# Patient Record
Sex: Male | Born: 1970 | Race: Black or African American | Hispanic: No | Marital: Married | Smoking: Current some day smoker
Health system: Southern US, Community
[De-identification: ages and names within clinical notes are randomized; demographics above are authoritative.]

## PROBLEM LIST (undated history)

## (undated) DIAGNOSIS — K219 Gastro-esophageal reflux disease without esophagitis: Secondary | ICD-10-CM

## (undated) DIAGNOSIS — Z860100 Personal history of colon polyps, unspecified: Secondary | ICD-10-CM

## (undated) DIAGNOSIS — S3609XA Other injury of spleen, initial encounter: Secondary | ICD-10-CM

## (undated) DIAGNOSIS — N3281 Overactive bladder: Secondary | ICD-10-CM

## (undated) DIAGNOSIS — K5792 Diverticulitis of intestine, part unspecified, without perforation or abscess without bleeding: Secondary | ICD-10-CM

## (undated) DIAGNOSIS — J302 Other seasonal allergic rhinitis: Secondary | ICD-10-CM

## (undated) DIAGNOSIS — I10 Essential (primary) hypertension: Secondary | ICD-10-CM

## (undated) DIAGNOSIS — T7840XA Allergy, unspecified, initial encounter: Secondary | ICD-10-CM

## (undated) HISTORY — PX: SPLENECTOMY: SUR1306

## (undated) HISTORY — DX: Allergy, unspecified, initial encounter: T78.40XA

## (undated) HISTORY — DX: Other injury of spleen, initial encounter: S36.09XA

## (undated) HISTORY — DX: Essential (primary) hypertension: I10

## (undated) HISTORY — DX: Gastro-esophageal reflux disease without esophagitis: K21.9

---

## 1998-03-26 ENCOUNTER — Other Ambulatory Visit: Admission: RE | Admit: 1998-03-26 | Discharge: 1998-03-26 | Payer: Self-pay | Admitting: Family Medicine

## 1999-02-03 ENCOUNTER — Emergency Department (HOSPITAL_COMMUNITY): Admission: EM | Admit: 1999-02-03 | Discharge: 1999-02-03 | Payer: Self-pay | Admitting: Emergency Medicine

## 1999-05-05 ENCOUNTER — Emergency Department (HOSPITAL_COMMUNITY): Admission: EM | Admit: 1999-05-05 | Discharge: 1999-05-05 | Payer: Self-pay | Admitting: Internal Medicine

## 2003-12-06 ENCOUNTER — Emergency Department (HOSPITAL_COMMUNITY): Admission: EM | Admit: 2003-12-06 | Discharge: 2003-12-06 | Payer: Self-pay | Admitting: Emergency Medicine

## 2006-04-05 ENCOUNTER — Ambulatory Visit: Payer: Self-pay | Admitting: Internal Medicine

## 2006-04-28 ENCOUNTER — Encounter: Payer: Self-pay | Admitting: Emergency Medicine

## 2006-04-30 ENCOUNTER — Ambulatory Visit: Payer: Self-pay | Admitting: Internal Medicine

## 2006-11-21 ENCOUNTER — Emergency Department (HOSPITAL_COMMUNITY): Admission: EM | Admit: 2006-11-21 | Discharge: 2006-11-21 | Payer: Self-pay | Admitting: Emergency Medicine

## 2007-01-27 ENCOUNTER — Ambulatory Visit: Payer: Self-pay | Admitting: Internal Medicine

## 2008-02-07 ENCOUNTER — Ambulatory Visit: Payer: Self-pay | Admitting: Internal Medicine

## 2008-02-07 DIAGNOSIS — Z9081 Acquired absence of spleen: Secondary | ICD-10-CM | POA: Insufficient documentation

## 2008-02-07 DIAGNOSIS — J069 Acute upper respiratory infection, unspecified: Secondary | ICD-10-CM | POA: Insufficient documentation

## 2008-08-21 ENCOUNTER — Ambulatory Visit: Payer: Self-pay | Admitting: Internal Medicine

## 2008-08-21 DIAGNOSIS — L909 Atrophic disorder of skin, unspecified: Secondary | ICD-10-CM | POA: Insufficient documentation

## 2008-08-21 DIAGNOSIS — L919 Hypertrophic disorder of the skin, unspecified: Secondary | ICD-10-CM

## 2009-05-13 ENCOUNTER — Encounter: Payer: Self-pay | Admitting: Internal Medicine

## 2009-05-14 ENCOUNTER — Telehealth: Payer: Self-pay | Admitting: Family Medicine

## 2009-05-14 ENCOUNTER — Ambulatory Visit: Payer: Self-pay | Admitting: Family Medicine

## 2009-05-14 DIAGNOSIS — N390 Urinary tract infection, site not specified: Secondary | ICD-10-CM

## 2009-05-14 DIAGNOSIS — R351 Nocturia: Secondary | ICD-10-CM | POA: Insufficient documentation

## 2009-05-14 LAB — CONVERTED CEMR LAB: PSA: 1.78 ng/mL (ref 0.10–4.00)

## 2009-05-20 ENCOUNTER — Ambulatory Visit: Payer: Self-pay | Admitting: Family Medicine

## 2009-05-20 ENCOUNTER — Telehealth (INDEPENDENT_AMBULATORY_CARE_PROVIDER_SITE_OTHER): Payer: Self-pay | Admitting: *Deleted

## 2009-05-20 LAB — CONVERTED CEMR LAB
Bilirubin Urine: NEGATIVE
Hemoglobin, Urine: NEGATIVE
Ketones, ur: NEGATIVE mg/dL
Leukocytes, UA: NEGATIVE
Nitrite: NEGATIVE
Specific Gravity, Urine: 1.025 (ref 1.000–1.030)
Total Protein, Urine: NEGATIVE mg/dL
Urine Glucose: NEGATIVE mg/dL
Urobilinogen, UA: 0.2 (ref 0.0–1.0)
pH: 5 (ref 5.0–8.0)

## 2009-05-21 ENCOUNTER — Encounter: Payer: Self-pay | Admitting: Family Medicine

## 2009-05-21 ENCOUNTER — Telehealth: Payer: Self-pay | Admitting: Internal Medicine

## 2009-08-07 LAB — CONVERTED CEMR LAB

## 2010-07-02 ENCOUNTER — Ambulatory Visit: Payer: Self-pay | Admitting: Family

## 2010-07-02 DIAGNOSIS — K921 Melena: Secondary | ICD-10-CM

## 2010-07-04 ENCOUNTER — Encounter (INDEPENDENT_AMBULATORY_CARE_PROVIDER_SITE_OTHER): Payer: Self-pay | Admitting: *Deleted

## 2010-07-04 LAB — CONVERTED CEMR LAB
ALT: 26 units/L (ref 0–53)
AST: 21 units/L (ref 0–37)
Albumin: 4.7 g/dL (ref 3.5–5.2)
Alkaline Phosphatase: 43 units/L (ref 39–117)
BUN: 11 mg/dL (ref 6–23)
Basophils Absolute: 0 10*3/uL (ref 0.0–0.1)
Basophils Relative: 0 % (ref 0–1)
Bilirubin, Direct: 0.1 mg/dL (ref 0.0–0.3)
CO2: 25 meq/L (ref 19–32)
Calcium: 9.9 mg/dL (ref 8.4–10.5)
Chloride: 103 meq/L (ref 96–112)
Cholesterol: 233 mg/dL — ABNORMAL HIGH (ref 0–200)
Creatinine, Ser: 1.05 mg/dL (ref 0.40–1.50)
Eosinophils Absolute: 0 10*3/uL (ref 0.0–0.7)
Eosinophils Relative: 1 % (ref 0–5)
Glucose, Bld: 97 mg/dL (ref 70–99)
HCT: 46.9 % (ref 39.0–52.0)
HDL: 57 mg/dL (ref >39)
Hemoglobin: 16.2 g/dL (ref 13.0–17.0)
Herpes Simplex Vrs I&II-IgM Ab (EIA): 0.66
Indirect Bilirubin: 0.7 mg/dL (ref 0.0–0.9)
LDL Cholesterol: 159 mg/dL — ABNORMAL HIGH (ref 0–99)
Lymphocytes Relative: 27 % (ref 12–46)
Lymphs Abs: 1.4 10*3/uL (ref 0.7–4.0)
MCHC: 34.5 g/dL (ref 30.0–36.0)
MCV: 88.2 fL (ref 78.0–100.0)
Monocytes Absolute: 0.3 10*3/uL (ref 0.1–1.0)
Monocytes Relative: 6 % (ref 3–12)
Neutro Abs: 3.3 10*3/uL (ref 1.7–7.7)
Neutrophils Relative %: 65 % (ref 43–77)
Platelets: 286 10*3/uL (ref 150–400)
Potassium: 4.5 meq/L (ref 3.5–5.3)
RBC: 5.32 M/uL (ref 4.22–5.81)
RDW: 12 % (ref 11.5–15.5)
Sodium: 139 meq/L (ref 135–145)
TSH: 1.962 microintl units/mL (ref 0.350–4.500)
Total Bilirubin: 0.8 mg/dL (ref 0.3–1.2)
Total CHOL/HDL Ratio: 4.1
Total Protein: 7.5 g/dL (ref 6.0–8.3)
Triglycerides: 84 mg/dL (ref <150)
VLDL: 17 mg/dL (ref 0–40)
WBC: 5 10*3/uL (ref 4.0–10.5)

## 2010-08-08 ENCOUNTER — Encounter (INDEPENDENT_AMBULATORY_CARE_PROVIDER_SITE_OTHER): Payer: Self-pay | Admitting: *Deleted

## 2010-08-08 ENCOUNTER — Ambulatory Visit: Payer: Self-pay | Admitting: Gastroenterology

## 2010-09-12 ENCOUNTER — Ambulatory Visit: Payer: Self-pay | Admitting: Gastroenterology

## 2010-09-12 LAB — HM COLONOSCOPY

## 2010-09-17 ENCOUNTER — Encounter: Payer: Self-pay | Admitting: Gastroenterology

## 2010-12-31 ENCOUNTER — Ambulatory Visit
Admission: RE | Admit: 2010-12-31 | Discharge: 2010-12-31 | Payer: Self-pay | Source: Home / Self Care | Attending: Internal Medicine | Admitting: Internal Medicine

## 2010-12-31 ENCOUNTER — Encounter: Payer: Self-pay | Admitting: Internal Medicine

## 2010-12-31 DIAGNOSIS — K2970 Gastritis, unspecified, without bleeding: Secondary | ICD-10-CM | POA: Insufficient documentation

## 2010-12-31 DIAGNOSIS — K2990 Gastroduodenitis, unspecified, without bleeding: Secondary | ICD-10-CM

## 2010-12-31 DIAGNOSIS — M546 Pain in thoracic spine: Secondary | ICD-10-CM | POA: Insufficient documentation

## 2011-01-15 ENCOUNTER — Ambulatory Visit
Admission: RE | Admit: 2011-01-15 | Discharge: 2011-01-15 | Payer: Self-pay | Source: Home / Self Care | Attending: Internal Medicine | Admitting: Internal Medicine

## 2011-01-15 LAB — CONVERTED CEMR LAB: Influenza B Ag: NEGATIVE

## 2011-01-27 NOTE — Letter (Signed)
Summary: Results Letter  [facility]  [address] [place], [place] [address]   Phone: [phone]  Fax: [phone]        September 17, 2010 MRN: [id number]    BUDDIE MARSTON [address] [place], [place]  [address]    Dear Mr. Lutes,   Good news.  The polyp(s) that were removed during your recent procedure were NOT pre-cancerous.  You should continue to follow current colorectal cancer screening guidelines with a repeat colonoscopy when you turn 50.  We will therefore put your information in our reminder system and will contact you in about 12 years to schedule a repeat procedure.  Please call if you have any questions or concerns.       Sincerely,  Rachael Fee MD  This letter has been electronically signed by your physician.  Appended Document: Results Letter letter mailed

## 2011-01-27 NOTE — Letter (Signed)
Summary: [facility] Instructions  [facility]  [address] [place], [place] [address]   Phone: [phone]  Fax: [phone]       Darin Smith    July 23, 1971    MRN: [id number]        Procedure Day /Date:09/12/10 FRI     Arrival Time:130 pm     Procedure Time:230 pm     Location of Procedure:                    X  [facility] (4th Floor)                        PREPARATION FOR COLONOSCOPY WITH MOVIPREP   Starting 5 days prior to your procedure 09/07/10 do not eat nuts, seeds, popcorn, corn, beans, peas,  salads, or any raw vegetables.  Do not take any fiber supplements (e.g. Metamucil, Citrucel, and Benefiber).  THE DAY BEFORE YOUR PROCEDURE         DATE: 09/11/10  DAY: THURS  1.  Drink clear liquids the entire day-NO SOLID FOOD  2.  Do not drink anything colored red or purple.  Avoid juices with pulp.  No orange juice.  3.  Drink at least 64 oz. (8 glasses) of fluid/clear liquids during the day to prevent dehydration and help the prep work efficiently.  CLEAR LIQUIDS INCLUDE: Water Jello Ice Popsicles Tea (sugar ok, no milk/cream) Powdered fruit flavored drinks Coffee (sugar ok, no milk/cream) Gatorade Juice: apple, white grape, white cranberry  Lemonade Clear bullion, consomm, broth Carbonated beverages (any kind) Strained chicken noodle soup Hard Candy                             4.  In the morning, mix first dose of MoviPrep solution:    Empty 1 Pouch A and 1 Pouch B into the disposable container    Add lukewarm drinking water to the top line of the container. Mix to dissolve    Refrigerate (mixed solution should be used within 24 hrs)  5.  Begin drinking the prep at 5:00 p.m. The MoviPrep container is divided by 4 marks.   Every 15 minutes drink the solution down to the next mark (approximately 8 oz) until the full liter is complete.   6.  Follow completed prep with 16 oz of clear liquid of your choice (Nothing red or purple).   Continue to drink clear liquids until bedtime.  7.  Before going to bed, mix second dose of MoviPrep solution:    Empty 1 Pouch A and 1 Pouch B into the disposable container    Add lukewarm drinking water to the top line of the container. Mix to dissolve    Refrigerate  THE DAY OF YOUR PROCEDURE      DATE: 09/12/10  DAY: Darin Smith  Beginning at 930 a.m. (5 hours before procedure):         1. Every 15 minutes, drink the solution down to the next mark (approx 8 oz) until the full liter is complete.  2. Follow completed prep with 16 oz. of clear liquid of your choice.    3. You may drink clear liquids until 1230 pm (2 HOURS BEFORE PROCEDURE).   MEDICATION INSTRUCTIONS  Unless otherwise instructed, you should take regular prescription medications with a small sip of water   as early as possible the morning of your procedure.  OTHER INSTRUCTIONS  You will need a responsible adult at least 40 years of age to accompany you and drive you home.   This person must remain in the waiting room during your procedure.  Wear loose fitting clothing that is easily removed.  Leave jewelry and other valuables at home.  However, you may wish to bring a book to read or  an iPod/MP3 player to listen to music as you wait for your procedure to start.  Remove all body piercing jewelry and leave at home.  Total time from sign-in until discharge is approximately 2-3 hours.  You should go home directly after your procedure and rest.  You can resume normal activities the  day after your procedure.  The day of your procedure you should not:   Drive   Make legal decisions   Operate machinery   Drink alcohol   Return to work  You will receive specific instructions about eating, activities and medications before you leave.    The above instructions have been reviewed and explained to me by   _______________________    I fully understand and can verbalize these instructions  _____________________________ Date _________

## 2011-01-27 NOTE — Assessment & Plan Note (Signed)
History of Present Illness Visit Type: Initial Consult Primary GI MD: Rob Bunting MD Primary Provider: Dondra Spry DO Requesting Provider: [name], PA Chief Complaint: heme pos stool History of Present Illness:     very pleasant 40 year old man who was recently in for routine physical. He was found to have Hemoccult-positive stool.  who never sees blood in his stool.  No problems with consipation or diarrhea.  his weight has been overall stable.  he had a recent CBC and was completely normal.   GI Review of Systems      Denies belching.         Current Medications (verified): 1)  None  Allergies (verified): No Known Drug Allergies  Past History:  Past Medical History: History of abdominal trauma with splenic rupture - age 57     Past Surgical History: S/P splenectomy     Family History: Mother is 41, is healthy.  Father is age 73, has hypertension.   Patient has 2 sisters, 1 sister is morbidly obese and has thyroid issues, younger sister with MS Mother deceased at age 75- lupus.  Was found dead in her home.  No autopsy was performed.  She went blind in her left eye (unclear etiology). no fam hx of prostate cancer  Social History: Occupation: Works for [employer] Married (2nd marriage) 4 children- currently going through divorce- 3 step children, 24 year old biological son- does not live with patient Never Smoked Alcohol use-few beers a week Denies drug use     Review of Systems       Pertinent positive and negative review of systems were noted in the above HPI and GI specific review of systems.  All other review of systems was otherwise negative.   Vital Signs:  Patient profile:   40 year old male Height:      73 inches Weight:      213.50 pounds BMI:     28.27 Pulse rate:   70 / minute Pulse rhythm:   regular BP sitting:   134 / 78  (right arm) Cuff size:   regular  Vitals Entered By: Christie Nottingham CMA Duncan Dull) (August 08, 2010 3:25  PM)  Physical Exam  Additional Exam:  Constitutional: generally well appearing Psychiatric: alert and oriented times 3 Eyes: extraocular movements intact Mouth: oropharynx moist, no lesions Neck: supple, no lymphadenopathy Cardiovascular: heart regular rate and rythm Lungs: CTA bilaterally Abdomen: soft, non-tender, non-distended, no obvious ascites, no peritoneal signs, normal bowel sounds Extremities: no lower extremity edema bilaterally Skin: no lesions on visible extremities    Impression & Recommendations:  Problem # 1:  Hemoccult positive stool we will schedule him for colonoscopy at his soonest convenience. I see no reason for any further blood tests or imaging studies prior to then.  Patient Instructions: 1)  You will be scheduled to have a colonoscopy. 2)  A copy of this information will be sent to [name], PA. 3)  The medication list was reviewed and reconciled.  All changed / newly prescribed medications were explained.  A complete medication list was provided to the patient / caregiver.  Appended Document: Orders Update/movi    Clinical Lists Changes  Medications: Added new medication of MOVIPREP 100 GM  SOLR (PEG-KCL-NACL-NASULF-NA ASC-C) As per prep instructions. - Signed Rx of MOVIPREP 100 GM  SOLR (PEG-KCL-NACL-NASULF-NA ASC-C) As per prep instructions.;  #1 x 0;  Signed;  Entered by: Chales Abrahams CMA (AAMA);  Authorized by: Rachael Fee MD;  Method used: [facility] [address] [phone]*, [address], [place], [place], [place]  [address], Ph: [phone] or [phone], Fax: [phone] Orders: Added new Test order of Colonoscopy (Colon) - Signed    Prescriptions: MOVIPREP 100 GM  SOLR (PEG-KCL-NACL-NASULF-NA ASC-C) As per prep instructions.  #1 x 0   Entered by:   Chales Abrahams CMA (AAMA)   Authorized by:   Rachael Fee MD   Signed by:   Chales Abrahams CMA (AAMA) on 08/08/2010   Method used:   [facility]         [facility] [address] [phone]* (retail)       [address]       [place], [place]  [address]       Ph: [phone] or [phone]       Fax: [phone]   RxID:   [id number]

## 2011-01-27 NOTE — Letter (Signed)
Summary: New Patient letter  [facility]  [address] [place], [place] [address]   Phone: [phone]  Fax: [phone]       07/04/2010 MRN: [id number]  Darin Smith [address] [place], [place]  [address]  Dear Mr. Closson,  Welcome to the Gastroenterology Division at [facility].    You are scheduled to see Dr.  Christella Hartigan on 08-08-10 at 3:30p.m. on the 3rd floor at [facility], [address]  We ask that you try to arrive at our office 15 minutes prior to your appointment time to allow for check-in.  We would like you to complete the enclosed self-administered evaluation form prior to your visit and bring it with you on the day of your appointment.  We will review it with you.  Also, please bring a complete list of all your medications or, if you prefer, bring the medication bottles and we will list them.  Please bring your insurance card so that we may make a copy of it.  If your insurance requires a referral to see a specialist, please bring your referral form from your primary care physician.  Co-payments are due at the time of your visit and may be paid by cash, check or credit card.     Your office visit will consist of a consult with your physician (includes a physical exam), any laboratory testing he/she may order, scheduling of any necessary diagnostic testing (e.g. x-ray, ultrasound, CT-scan), and scheduling of a procedure (e.g. Endoscopy, Colonoscopy) if required.  Please allow enough time on your schedule to allow for any/all of these possibilities.    If you cannot keep your appointment, please call [phone] to cancel or reschedule prior to your appointment date.  This allows [place] the opportunity to schedule an appointment for another patient in need of care.  If you do not cancel or reschedule by 5 p.m. the business day prior to your appointment date, you will be charged a $50.00 late cancellation/no-show fee.    Thank you for choosing  [facility] for your medical needs.  We appreciate the opportunity to care for you.  Please visit [place] at our website  to learn more about our practice.                     Sincerely,                                                             The Gastroenterology Division

## 2011-01-27 NOTE — Assessment & Plan Note (Signed)
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 Never smoked.  + ETOH few  beers a week.   Preventive Screening-Counseling & Management  Alcohol-Tobacco     Alcohol drinks/day: <1     Smoking Status: never  Caffeine-Diet-Exercise     Caffeine use/day: 2-3 beverages daily     Does Patient Exercise: yes  Allergies (verified): No Known Drug Allergies  Past History:  Past Medical History: Last updated: 08/21/2008 History of abdominal trauma with splenic rupture - age 89   Past Surgical History: Last updated: 08/21/2008 S/P splenectomy   Family History: Last updated: 07/02/2010 Mother is 89, is healthy.  Father is age 74, has hypertension.   Patient has 2 sisters, 1 sister is morbidly obese and has thyroid issues, younger sister with MS Mother deceased at age 63- lupus.  Was found dead in her home.  No autopsy was performed.  She went blind in her left eye (unclear etiology).  no fam hx of prostate cancer  Social History: Last updated: 07/02/2010 Occupation: Works for [employer] Married (2nd marriage) 4 children- currently going through divorce- 3 step children, 103 year old biological son- does not live with patient Never Smoked Alcohol use-few beers a week Denies  drug use  Risk Factors: Alcohol Use: <1 (07/02/2010) Caffeine Use: 2-3 beverages daily (07/02/2010) Exercise: yes (07/02/2010)  Risk Factors: Smoking Status: never (07/02/2010)  Family History: Mother is 74, is healthy.  Father is age 15, has hypertension.   Patient has 2 sisters, 1 sister is morbidly obese and has thyroid issues, younger sister with MS Mother deceased at age 94- lupus.  Was found dead in her home.  No autopsy was performed.  She went blind in her left eye (unclear etiology).  no fam hx of prostate cancer  Social History: Occupation: Works for [employer] Married (2nd marriage) 4 children- currently going through divorce- 3 step children, 48 year old biological son- does not live with patient Never Smoked Alcohol use-few beers a week Denies drug use Caffeine use/day:  2-3 beverages daily Does Patient Exercise:  yes  Review of Systems       Constitutional: Denies Fever ENT:  Denies nasal congestion or sore throat. Resp: Denies cough CV:  Denies Chest Pain GI:  Denies nausea or vomitting GU: Denies dysuria Lymphatic: Denies current  lymphadenopathy- does not that he had episode of tender LAD in groin about 1 year ago- no penile rashes- 1 sexual partner in last 6 months Musculoskeletal:  Denies muscle/joint pain-  notes + fascitis in his left foot- had injections, intermittently bothers him. Skin:  Denies Rashes Psychiatric: Denies depression or anxiety Neuro: Denies numbness or weakness     Physical Exam  General:  Well-developed,well-nourished,in no acute distress; alert,appropriate and cooperative throughout examination Head:  Normocephalic and atraumatic without obvious abnormalities. No apparent alopecia or balding. Eyes:  PERRLA Ears:  External ear exam shows no significant lesions or deformities.  Otoscopic examination reveals clear canals, tympanic membranes are intact bilaterally without bulging, retraction, inflammation or discharge. Hearing is grossly  normal bilaterally. Mouth:  Oral mucosa and oropharynx without lesions or exudates.  Teeth in good repair. Neck:  No deformities, masses, or tenderness noted. Lungs:  Normal respiratory effort, chest expands symmetrically. Lungs are clear to auscultation, no crackles or wheezes. Heart:  Normal rate and regular rhythm. S1 and S2 normal without gallop, murmur, click, rub or other extra sounds. Abdomen:  Bowel sounds positive,abdomen soft and non-tender without masses, organomegaly or hernias noted. Rectal:  no external abnormalities, no hemorrhoids, and normal sphincter tone.  heme + stool Genitalia:  Testes bilaterally descended without nodularity, tenderness or masses. No scrotal masses or lesions. No penis lesions or urethral discharge. Prostate:  Prostate gland firm and smooth, no enlargement, nodularity, tenderness, mass, asymmetry or induration. Msk:  No deformity or scoliosis noted of thoracic or lumbar spine.   Extremities:  No clubbing, cyanosis, edema, or deformity noted with normal full range of motion of all joints.   Neurologic:  No cranial nerve deficits noted. Station and gait are normal. Plantar reflexes are down-going bilaterally. DTRs are symmetrical throughout. Sensory, motor and coordinative functions appear intact. Skin:  Intact without suspicious lesions or rashes Cervical Nodes:  No lymphadenopathy noted Psych:  Cognition and judgment appear intact. Alert and cooperative with normal attention span and concentration. No apparent delusions, illusions, hallucinations   Impression & Recommendations:  Problem # 1:  Preventive Health Care (ICD-V70.0) Assessment Comment Only Patient was counseled on diet, exercise, and weight loss.  He was also counseled on TSE.  Immunizations reviewed and up to date. Orders: TLB-BMP (Basic Metabolic Panel-BMET) (80048-METABOL) TLB-CBC Platelet - w/Differential (85025-CBCD) TLB-Hepatic/Liver Function Pnl (80076-HEPATIC) TLB-TSH (Thyroid  Stimulating Hormone) (84443-TSH) TLB-Lipid Panel (80061-LIPID) T-HIV-1 (Screen) ([address]) T-Herpes I and II, IgM, Reflex (09811-91478)  Problem # 2:  HEMOCCULT POSITIVE STOOL (ICD-578.1) Assessment: New  Incidental finding today on exam.  No visible or palpable hemorrhoids.  H/H normal.  Will refer to GI for further evaluation.    Orders: Gastroenterology Referral (GI)  Complete Medication List: 1)  Ciprofloxacin Hcl 500 Mg Tabs (Ciprofloxacin hcl) .... Take 1 two times a day for 7 days  Patient Instructions: 1)  It is important that you exercise regularly at least 20 minutes 5 times a week. If you develop chest pain, have severe difficulty breathing, or feel very tired , stop exercising immediately and seek medical attention. 2)  Work hard on a healthy diet. 3)  Complete your blood work downstairs today on the first floor. 4)  Follow up in 1 year, sooner if problems or concerns. Prescriptions: CIPROFLOXACIN HCL 500 MG TABS (CIPROFLOXACIN HCL) take 1 two times a day for 7 days  #14 x 0   Entered and Authorized by:   Darin Smith   Signed by:   Darin Smith on 07/02/2010   Method used:   [facility]        [facility]  [address] [phone]* (retail)  [address]       [place], [place]  [address]       Ph: [phone]       Fax: [phone]   RxID:   [id number]   Current Allergies (reviewed today): No known allergies    Preventive Care Screening  Last Tetanus Booster:    Date:  06/16/2005    Results:  Historical     Preventive Care Screening  Last Tetanus Booster:    Date:  06/16/2005    Results:  Historical

## 2011-01-27 NOTE — Procedures (Signed)
Summary: Colonoscopy  Patient: Darin Smith Note: All result statuses are Final unless otherwise noted.  Tests: (1) Colonoscopy (COL)   COL Colonoscopy           DONE     [facility]     [address]     [place], [place]  [address]           COLONOSCOPY PROCEDURE REPORT           PATIENT:  [name], [name]  MR#:  [id number]     BIRTHDATE:  [birth year], 38 yrs. old  GENDER:  male     ENDOSCOPIST:  Rachael Fee, MD     REF. BY:  Alma DownsLendell Caprice, N.P.     PROCEDURE DATE:  09/12/2010     PROCEDURE:  Colonoscopy with snare polypectomy     ASA CLASS:  Class II     INDICATIONS:  FOBT positive stool     MEDICATIONS:   Fentanyl 50 mcg IV, Versed 5 mg IV           DESCRIPTION OF PROCEDURE:   After the risks benefits and     alternatives of the procedure were thoroughly explained, informed     consent was obtained.  Digital rectal exam was performed and     revealed no rectal masses.   The LB [id number] endoscope     was introduced through the anus and advanced to the cecum, which     was identified by both the appendix and ileocecal valve, without     limitations.  The quality of the prep was excellent, using     MoviPrep.  The instrument was then slowly withdrawn as the colon     was fully examined.     <<PROCEDUREIMAGES>>     FINDINGS:  There was a single small sessile polyp in rectum.  This     was 3mm across, removed with cold snare and sent to pathology (jar     1) (see image3).  This was otherwise a normal examination of the     colon (see image4, image2, and image1).   Retroflexed views in the     rectum revealed no abnormalities.    The scope was then withdrawn     from the patient and the procedure completed.     COMPLICATIONS:  None           ENDOSCOPIC IMPRESSION:     1) Small polyp, removed and sent to pathology     2) Otherwise normal examination           RECOMMENDATIONS:     1) If the polyp(s) removed today are proven to be adenomatous  (pre-cancerous) polyps, you will need a repeat colonoscopy in 5     years. Otherwise you should continue to follow colorectal cancer     screening guidelines for "routine risk" patients with colonoscopy     at age 46.     2) You will receive a letter within 1-2 weeks with the results     of your biopsy as well as final recommendations. Please call my     office if you have not received a letter after 3 weeks.           ______________________________     Rachael Fee, MD           n.     eSIGNED:   Rachael Fee at 09/12/2010 02:40 PM  Lorn, Butcher, [id number]  Note: An exclamation mark (!) indicates a result that was not dispersed into the flowsheet. Document Creation Date: 09/12/2010 2:41 PM _______________________________________________________________________  (1) Order result status: Final Collection or observation date-time: 09/12/2010 14:35 Requested date-time:  Receipt date-time:  Reported date-time:  Referring Physician:   Ordering Physician: Rob Bunting [phone]) Specimen Source:  Source: Launa Grill Order Number: [id number] Lab site:   Appended Document: Colonoscopy     Procedures Next Due Date:    Colonoscopy: 08/2022

## 2011-01-29 NOTE — Assessment & Plan Note (Signed)
Summary: Abd Pain  acute/hea   Vital Signs:  Patient profile:   40 year old male Height:      73 inches Weight:      209.25 pounds BMI:     27.71 O2 Sat:      100 % on Room air Temp:     98.2 degrees F oral Pulse rate:   89 / minute Resp:     18 per minute BP sitting:   124 / 80  (right arm) Cuff size:   large  Vitals Entered By: Glendell Docker CMA (December 31, 2010 11:20 AM)  O2 Flow:  Room air CC: abdominal pain Is Patient Diabetic? No Pain Assessment Patient in pain? 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  Abdominal pain for the past 3 weeks. She states pepo bismo x 3 with some relief   Primary Care Provider:  Dondra Spry DO  CC:  abdominal pain.  History of Present Illness: 3 months ago - seen at [facility] for thoracic back pain prescribed mobic and flexeril mobic causing abd pain no melena no change in bowel habits  Preventive Screening-Counseling & Management  Alcohol-Tobacco     Smoking Status: never  Allergies (verified): No Known Drug Allergies  Past History:  Past Medical History: History of abdominal trauma with splenic rupture - age 68       Past Surgical History: S/P splenectomy      Family History: Mother is 36, is healthy.  Father is age 20, has hypertension.   Patient has 2 sisters, 1 sister is morbidly obese and has thyroid issues, younger sister with MS Mother deceased at age 74- lupus.  Was found dead in her home.  No autopsy was performed.  She went blind in her left eye (unclear etiology). no fam hx of prostate cancer    Social History: Occupation: Works for [employer] Married (2nd marriage) 4 children- currently going through divorce- 3 step children, 78 year old biological son- does not live with patient Never Smoked Alcohol use-few beers a week  Denies drug use     Review of Systems  The  patient denies hemoptysis, melena, and hematochezia.    Physical Exam  General:  alert, well-developed, and well-nourished.   Lungs:  Normal respiratory effort, chest expands symmetrically. Lungs are clear to auscultation, no crackles or wheezes. Heart:  Normal rate and regular rhythm. S1 and S2 normal without gallop, murmur, click, rub or other extra sounds. Msk:  T7-9 rotated left   Impression & Recommendations:  Problem # 1:  BACK PAIN, THORACIC REGION (ICD-724.1)  intermittent thoracic back pain secondary to somatic dysfunction persistent symptoms despite NSAIDs and trigger point injectin utilized myofacial release and HVLA to LS and thoracic spine pt tolerated well  Orders: OMT 1-2 Body [place] (09811)  Problem # 2:  GASTRITIS (ICD-535.50) probable NSAID indused gastritis PPI x 4-6 weeks stop NSAIDs His updated medication list for this problem includes:    Omeprazole-sodium Bicarbonate 40-1100 Mg Caps (Omeprazole-sodium bicarbonate) ..... One by mouth once daily  Complete Medication List: 1)  Omeprazole-sodium Bicarbonate 40-1100 Mg Caps (Omeprazole-sodium bicarbonate) .... One by mouth once daily  Patient Instructions: 1)  Please schedule a follow-up appointment in 1 month. Prescriptions: OMEPRAZOLE-SODIUM BICARBONATE 40-1100 MG CAPS (OMEPRAZOLE-SODIUM BICARBONATE) one by mouth once daily  #30 x 2   Entered and Authorized by:   D. Molly Maduro  Artist Pais DO   Signed by:   D. Thomos Lemons DO on 12/31/2010   Method used:   Electronically to        [facility] [address] [phone]* (retail)       [address]       [place], [place]  [address]       Ph: [phone] or [phone]       Fax: [phone]   RxID:   [id number]    Orders Added: 1)  Est. Patient Level III [33295] 2)  OMT 1-2 Body [place]ions [18841]    Current Allergies (reviewed today): No known allergies

## 2011-01-29 NOTE — Letter (Signed)
Summary: [facility]  [facility]   Imported By: Lanelle Bal 01/21/2011 09:08:53  _____________________________________________________________________  External Attachment:    Type:   Image     Comment:   External Document

## 2011-02-04 NOTE — Assessment & Plan Note (Signed)
Summary: 11:15 SINUS INFECTION/MHF   Vital Signs:  Patient profile:   40 year old male Height:      73 inches Weight:      207 pounds BMI:     27.41 O2 Sat:      99 % on Room air Temp:     97.9 degrees F oral Pulse rate:   71 / minute Resp:     18 per minute BP sitting:   120 / 80  (left arm) Cuff size:   large  Vitals Entered By: Glendell Docker CMA (January 15, 2011 11:18 AM)  O2 Flow:  Room air CC: Sinus discomfort Is Patient Diabetic? No Pain Assessment Patient in pain? no        Primary Care Provider:  Dondra Spry DO  CC:  Sinus discomfort.  History of Present Illness:  40 y/o male co facial pain, no fever, non-productive cough , nasal drainage yellow in color otc robitussin with some some relief, this am sinex with no relief body aches  Preventive Screening-Counseling & Management  Alcohol-Tobacco     Smoking Status: never  Allergies: No Known Drug Allergies SH/Risk Factors reviewed for relevance  Family History: Mother is 36, is healthy.  Father is age 23, has hypertension.   Patient has 2 sisters, 1 sister is morbidly obese and has thyroid issues, younger sister with MS Mother deceased at age 68- lupus.  Was found dead in her home.  No autopsy was performed.  She went blind in her left eye (unclear etiology). no fam hx of prostate cancer     Social History: Occupation: Works for [employer] Married (2nd marriage) 4 children- currently going through divorce- 3 step children, 9 year old biological son- does not live with patient Never Smoked Alcohol use-few beers a week   Denies drug use     Physical Exam  General:  alert, well-developed, and well-nourished.   Ears:  right ear retracted,  left Mouth:  pharyngeal erythema.   Neck:  mild right neck tenderness Lungs:  normal respiratory effort and normal breath sounds.   Heart:  normal rate, regular rhythm, and no gallop.     Impression & Recommendations:  Problem # 1:  URI  (ICD-465.9) Assessment Unchanged  Instructed on symptomatic treatment. Call if symptoms persist or worsen.   Problem # 2:  GASTRITIS (ICD-535.50) Assessment: Improved  His updated medication list for this problem includes:    Omeprazole-sodium Bicarbonate 40-1100 Mg Caps (Omeprazole-sodium bicarbonate) ..... One by mouth once daily  Complete Medication List: 1)  Omeprazole-sodium Bicarbonate 40-1100 Mg Caps (Omeprazole-sodium bicarbonate) .... One by mouth once daily 2)  Cefuroxime Axetil 500 Mg Tabs (Cefuroxime axetil) .... One by mouth two times a day  Other Orders: Flu A+B (16109)  Patient Instructions: 1)  Drink plenty of fluids 2)  Take OTC analgesics as directed 3)  Call our office if your symptoms do not  improve or gets worse. Prescriptions: CEFUROXIME AXETIL 500 MG TABS (CEFUROXIME AXETIL) one by mouth two times a day  #14 x 0   Entered and Authorized by:   D. Thomos Lemons DO   Signed by:   D. Thomos Lemons DO on 01/15/2011   Method used:   Print then Give to Patient   RxID:   [id number]    Orders Added: 1)  Flu A+B [87400] 2)  New Patient Level III [99203]     Laboratory Results    Other Tests  Influenza A: negative Influenza B: negative

## 2011-02-06 ENCOUNTER — Ambulatory Visit: Payer: Self-pay | Admitting: Internal Medicine

## 2011-07-24 ENCOUNTER — Encounter: Payer: Self-pay | Admitting: Internal Medicine

## 2011-08-04 ENCOUNTER — Ambulatory Visit (INDEPENDENT_AMBULATORY_CARE_PROVIDER_SITE_OTHER): Payer: BC Managed Care – PPO | Admitting: Family

## 2011-08-04 ENCOUNTER — Encounter: Payer: Self-pay | Admitting: Family

## 2011-08-04 ENCOUNTER — Encounter: Payer: Self-pay | Admitting: Internal Medicine

## 2011-08-04 DIAGNOSIS — Z0000 Encounter for general adult medical examination without abnormal findings: Secondary | ICD-10-CM

## 2011-08-04 DIAGNOSIS — Z23 Encounter for immunization: Secondary | ICD-10-CM

## 2011-08-04 DIAGNOSIS — R591 Generalized enlarged lymph nodes: Secondary | ICD-10-CM

## 2011-08-04 DIAGNOSIS — R599 Enlarged lymph nodes, unspecified: Secondary | ICD-10-CM

## 2011-08-04 NOTE — Progress Notes (Signed)
Subjective:    Patient ID: Darin Smith, male    DOB: 08/02/1971, 40 y.o.   MRN: [id number]  HPI  Preventative-  Works as [employer]-  Not formaly exercising.  Diet is "up and down." eating more fruits and veggies. Still eating fast food for lunch.    Review of Systems  Constitutional: Negative for unexpected weight change.  HENT: Negative for hearing loss.   Eyes: Negative for visual disturbance.  Respiratory: Negative for shortness of breath.   Cardiovascular: Negative for chest pain and leg swelling.  Gastrointestinal: Negative for diarrhea, constipation and blood in stool.  Genitourinary: Negative for dysuria and frequency.  Musculoskeletal: Positive for back pain.       Pt to see orthopedic spine specialist- awaiting apt.  Neurological: Negative for headaches.  Hematological: Positive for adenopathy.       Reports + swollen lymph nodes in the groin area.   Non tender. First noticed this 4-5 yrs ago- reports that he discussed this with Dr. Artist Pais  Psychiatric/Behavioral:       Denies depression/anxiety.   Past Medical History  Diagnosis Date  . Rupture spleen age 8    hx of abdominal trauma    History   Social History  . Marital Status: Legally Separated    Spouse Name: N/A    Number of Children: N/A  . Years of Education: N/A   Occupational History  . Not on file.   Social History Main Topics  . Smoking status: Never Smoker   . Smokeless tobacco: Not on file  . Alcohol Use: Yes     few beers a week  . Drug Use: No  . Sexually Active:    Other Topics Concern  . Not on file   Social History Narrative   Regular exercise:  [employer] driverCaffeine Use: 2-3 soda/teaDivorced, son shares time between pt and his ex wife.     Past Surgical History  Procedure Date  . Splenectomy     Family History  Problem Relation Age of Onset  . Lupus Mother   . Vision loss Mother     in left eye  . Hypertension Father   . Obesity Sister   . Thyroid disease Sister   . Cancer Neg  Hx     prostate  . Multiple sclerosis Sister     No Known Allergies  No current outpatient prescriptions on file prior to visit.    BP 124/80  Pulse 78  Temp(Src) 98.1 F (36.7 C) (Oral)  Resp 16  Ht 6' 0.99" (1.854 m)  Wt 212 lb 1.3 oz (96.199 kg)  BMI 27.99 kg/m2        Objective:   Physical Exam  Constitutional: He is oriented to person, place, and time. He appears well-developed and well-nourished.  HENT:  Head: Normocephalic and atraumatic.  Eyes: Conjunctivae are normal. Pupils are equal, round, and reactive to light.  Neck: Normal range of motion. Neck supple. No thyromegaly present.  Cardiovascular: Normal rate and regular rhythm.   Pulmonary/Chest: Effort normal and breath sounds normal.  Abdominal: Soft. Bowel sounds are normal. He exhibits no distension and no mass.       Midline ventral incision- intact without any bulging or visible hernia.    Musculoskeletal: Normal range of motion. He exhibits no edema.  Neurological: He is alert and oriented to person, place, and time. He displays normal reflexes. No cranial nerve deficit. He exhibits normal muscle tone. Coordination normal.  Skin: Skin is warm and dry.  No erythema.  Psychiatric: He has a normal mood and affect. His behavior is normal. Judgment and thought content normal.  Lymph- no significant cervical, axillary or inguinal LAD noted.          Assessment & Plan:

## 2011-08-04 NOTE — Patient Instructions (Signed)
Please complete your lab work on the first floor.  Follow up in 1 year, sooner if problems or concerns.

## 2011-08-04 NOTE — Assessment & Plan Note (Signed)
Pt counseled on health diet and exercise.   Will give pneumovax today as pt has hx of spelectomy and we have no record of pneumovax.  Will send HSV titers due to patient's report of inguina LAD, however none is appreciated today.  Will also send HIV screen.  Intermittent LAD likely related to pt's hx of splenectomy.

## 2011-08-05 ENCOUNTER — Telehealth: Payer: Self-pay | Admitting: Family

## 2011-08-05 DIAGNOSIS — N419 Inflammatory disease of prostate, unspecified: Secondary | ICD-10-CM

## 2011-08-05 LAB — CBC WITH DIFFERENTIAL/PLATELET
Basophils Absolute: 0 10*3/uL (ref 0.0–0.1)
Eosinophils Relative: 1 % (ref 0–5)
HCT: 46.6 % (ref 39.0–52.0)
Lymphocytes Relative: 17 % (ref 12–46)
Lymphs Abs: 0.9 10*3/uL (ref 0.7–4.0)
MCV: 92.1 fL (ref 78.0–100.0)
Neutro Abs: 3.7 10*3/uL (ref 1.7–7.7)
Platelets: 314 10*3/uL (ref 150–400)
RBC: 5.06 MIL/uL (ref 4.22–5.81)
RDW: 13.6 % (ref 11.5–15.5)
WBC: 5.1 10*3/uL (ref 4.0–10.5)

## 2011-08-05 LAB — BASIC METABOLIC PANEL
CO2: 26 mEq/L (ref 19–32)
Calcium: 9.2 mg/dL (ref 8.4–10.5)
Creat: 1.03 mg/dL (ref 0.50–1.35)
Glucose, Bld: 90 mg/dL (ref 70–99)
Sodium: 139 mEq/L (ref 135–145)

## 2011-08-05 LAB — HEPATIC FUNCTION PANEL
Albumin: 4.1 g/dL (ref 3.5–5.2)
Alkaline Phosphatase: 53 U/L (ref 39–117)
Total Protein: 6.9 g/dL (ref 6.0–8.3)

## 2011-08-05 LAB — HSV 1 ANTIBODY, IGG: HSV 1 Glycoprotein G Ab, IgG: 0.14 IV

## 2011-08-05 LAB — LIPID PANEL
HDL: 58 mg/dL (ref >39)
Total CHOL/HDL Ratio: 3.1 Ratio
Triglycerides: 77 mg/dL (ref <150)

## 2011-08-05 LAB — TSH: TSH: 2.005 u[IU]/mL (ref 0.350–4.500)

## 2011-08-05 MED ORDER — VALACYCLOVIR HCL 1 G PO TABS: 1000.0000 mg | ORAL_TABLET | Freq: Two times a day (BID) | ORAL | Status: AC

## 2011-08-05 MED ORDER — CIPROFLOXACIN HCL 500 MG PO TABS: 500.0000 mg | ORAL_TABLET | Freq: Two times a day (BID) | ORAL | Status: AC

## 2011-08-05 NOTE — Telephone Encounter (Signed)
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  Upon further questioning he notes that he has seen ulcerated penile lesions about every 2 months for the last 4 years.  We discussed risk of transmission to others and acute versus suppressive therapy.  He would like to start a suppressive therapy.  Also reviewed borderline elevated PSA.  Will plan to treat empirically for prostatitis with cipro.  Repeat PSA in 1 month- pt verbalizes understanding.

## 2011-09-10 ENCOUNTER — Telehealth: Payer: Self-pay | Admitting: Family

## 2011-09-10 DIAGNOSIS — R972 Elevated prostate specific antigen [PSA]: Secondary | ICD-10-CM

## 2011-09-10 NOTE — Telephone Encounter (Signed)
Please call pt and ask him to return to the lab next week to complete a follow up PSA.

## 2011-09-11 NOTE — Telephone Encounter (Signed)
Lab order created today has been cancelled as a future order was placed for this test in August. Confirmed with Katrina that order is already in the lab.

## 2011-09-11 NOTE — Telephone Encounter (Signed)
Pt called back asking if he could go to [facility] at [address] for PSA level. Spoke to [place] and she will fax order to that facility. Notified pt he may have lab checked there.

## 2011-09-11 NOTE — Telephone Encounter (Signed)
Notified pt. Lab order entered and forwarded to the lab for next week.

## 2011-09-13 ENCOUNTER — Telehealth: Payer: Self-pay | Admitting: Family

## 2011-09-13 DIAGNOSIS — R972 Elevated prostate specific antigen [PSA]: Secondary | ICD-10-CM

## 2011-09-14 ENCOUNTER — Telehealth: Payer: Self-pay | Admitting: Family

## 2011-09-14 NOTE — Telephone Encounter (Signed)
See phone note dated 9/17.

## 2011-09-14 NOTE — Telephone Encounter (Signed)
Called pt, reviewed PSA results and plans to refer to urology. Pt verbalizes understanding.

## 2011-09-28 ENCOUNTER — Encounter: Payer: Self-pay | Admitting: *Deleted

## 2011-09-28 NOTE — Telephone Encounter (Signed)
Encounter opened in error

## 2011-12-02 ENCOUNTER — Encounter: Payer: Self-pay | Admitting: Family

## 2011-12-02 ENCOUNTER — Ambulatory Visit (INDEPENDENT_AMBULATORY_CARE_PROVIDER_SITE_OTHER): Payer: BC Managed Care – PPO | Admitting: Family

## 2011-12-02 VITALS — BP 132/86 | HR 84 | Temp 98.0°F | Resp 16 | Wt 216.0 lb

## 2011-12-02 DIAGNOSIS — J029 Acute pharyngitis, unspecified: Secondary | ICD-10-CM

## 2011-12-02 DIAGNOSIS — J069 Acute upper respiratory infection, unspecified: Secondary | ICD-10-CM

## 2011-12-02 LAB — POCT INFLUENZA A/B: Influenza B, POC: NEGATIVE

## 2011-12-02 LAB — POCT RAPID STREP A (OFFICE): Rapid Strep A Screen: NEGATIVE

## 2011-12-02 MED ORDER — CEFDINIR 300 MG PO CAPS: 300.0000 mg | ORAL_CAPSULE | Freq: Two times a day (BID) | ORAL | Status: AC

## 2011-12-02 NOTE — Progress Notes (Signed)
  Subjective:    Patient ID: Darin Smith, male    DOB: October 14, 1971, 40 y.o.   MRN: [id number]  HPI  Mr.  Smith is a 40 yr old male who presents today with chief complaint of sore throat since Monday.  He tried robitussin DM for a cough, then he took the multisymptom. No significant improvement.  Denies fever.   Reports myalgias and fatigue.  Review of Systems    see hpi Objective:   Physical Exam  Constitutional: He appears well-developed and well-nourished.       Tired appearing  HENT:  Right Ear: Tympanic membrane and ear canal normal.  Left Ear: Tympanic membrane and ear canal normal.  Mouth/Throat: Posterior oropharyngeal erythema present. No oropharyngeal exudate or posterior oropharyngeal edema.  Cardiovascular: Normal rate and regular rhythm.   No murmur heard. Pulmonary/Chest: Effort normal and breath sounds normal. No respiratory distress. He has no wheezes. He has no rales. He exhibits no tenderness.  Skin: Skin is warm and dry.  Psychiatric: He has a normal mood and affect. His behavior is normal. Judgment and thought content normal.          Assessment & Plan:

## 2011-12-02 NOTE — Assessment & Plan Note (Signed)
Rapid strep is negative. Flu swab also negative. 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 Call if symptoms worsen or do not improve.

## 2011-12-02 NOTE — Patient Instructions (Signed)
Please call if your symptoms worsen, if you develop fever >100,  or if your are not feeling better in 2-3 days.

## 2011-12-04 ENCOUNTER — Telehealth: Payer: Self-pay | Admitting: Family

## 2011-12-04 NOTE — Telephone Encounter (Signed)
Called patient to follow up.  He reports feeling improved today.

## 2012-01-12 ENCOUNTER — Telehealth: Payer: Self-pay | Admitting: *Deleted

## 2012-01-12 MED ORDER — CEFUROXIME AXETIL 500 MG PO TABS: 500.0000 mg | ORAL_TABLET | Freq: Two times a day (BID) | ORAL | Status: AC

## 2012-01-12 NOTE — Telephone Encounter (Signed)
Pt notified and voices understanding. 

## 2012-01-12 NOTE — Telephone Encounter (Signed)
Pt called requesting refill of antibiotic. Advised pt that he will need to be seen before an antibiotic can be prescribed. 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  Currently has cough with yellow mucus, no fever or other symptoms at present. Please advise.

## 2012-01-12 NOTE — Telephone Encounter (Signed)
Rx has been sent to his pharmacy.  He will need to be seen in the office if fever >101, if symptoms worsen,  or if no improvement in 2-3 days.

## 2012-04-27 HISTORY — PX: BIOPSY PROSTATE: PRO28

## 2012-06-01 ENCOUNTER — Encounter: Payer: Self-pay | Admitting: Family

## 2012-08-12 ENCOUNTER — Encounter: Payer: Self-pay | Admitting: Family

## 2012-08-12 ENCOUNTER — Ambulatory Visit (INDEPENDENT_AMBULATORY_CARE_PROVIDER_SITE_OTHER): Payer: BC Managed Care – PPO | Admitting: Family

## 2012-08-12 VITALS — BP 110/78 | HR 73 | Temp 98.1°F | Resp 16 | Ht 73.0 in | Wt 210.0 lb

## 2012-08-12 DIAGNOSIS — Z9081 Acquired absence of spleen: Secondary | ICD-10-CM

## 2012-08-12 DIAGNOSIS — K219 Gastro-esophageal reflux disease without esophagitis: Secondary | ICD-10-CM

## 2012-08-12 DIAGNOSIS — Z9089 Acquired absence of other organs: Secondary | ICD-10-CM

## 2012-08-12 DIAGNOSIS — R972 Elevated prostate specific antigen [PSA]: Secondary | ICD-10-CM

## 2012-08-12 DIAGNOSIS — Z0000 Encounter for general adult medical examination without abnormal findings: Secondary | ICD-10-CM

## 2012-08-12 LAB — LIPID PANEL
Cholesterol: 172 mg/dL (ref 0–200)
HDL: 53 mg/dL (ref >39)
Total CHOL/HDL Ratio: 3.2 Ratio
Triglycerides: 71 mg/dL (ref <150)
VLDL: 14 mg/dL (ref 0–40)

## 2012-08-12 LAB — BASIC METABOLIC PANEL WITH GFR
GFR, Est African American: >89 mL/min
Glucose, Bld: 91 mg/dL (ref 70–99)
Potassium: 4.6 mEq/L (ref 3.5–5.3)
Sodium: 142 mEq/L (ref 135–145)

## 2012-08-12 LAB — CBC WITH DIFFERENTIAL/PLATELET
Eosinophils Absolute: 0.1 10*3/uL (ref 0.0–0.7)
Hemoglobin: 15.5 g/dL (ref 13.0–17.0)
Lymphocytes Relative: 26 % (ref 12–46)
Lymphs Abs: 1.3 10*3/uL (ref 0.7–4.0)
MCH: 30.3 pg (ref 26.0–34.0)
Monocytes Relative: 9 % (ref 3–12)
Neutro Abs: 3.1 10*3/uL (ref 1.7–7.7)
Neutrophils Relative %: 64 % (ref 43–77)
Platelets: 310 10*3/uL (ref 150–400)
RBC: 5.12 MIL/uL (ref 4.22–5.81)
WBC: 4.9 10*3/uL (ref 4.0–10.5)

## 2012-08-12 LAB — HEPATIC FUNCTION PANEL
ALT: 22 U/L (ref 0–53)
Albumin: 4.3 g/dL (ref 3.5–5.2)
Total Protein: 7.3 g/dL (ref 6.0–8.3)

## 2012-08-12 MED ORDER — CEFUROXIME AXETIL 500 MG PO TABS: ORAL_TABLET | ORAL | Status: DC

## 2012-08-12 NOTE — Assessment & Plan Note (Signed)
He has had 2 negative prostate biopsies.  He is monitored by Dr. Brunilda Payor and has follow up in December.  I instructed pt to keep this apt.

## 2012-08-12 NOTE — Assessment & Plan Note (Signed)
Recommended prilosec otc prn.  GERD diet hand out provided.

## 2012-08-12 NOTE — Assessment & Plan Note (Addendum)
Immunizations reviewed and up to date.  Obtain fasting lab work.  Pt counseled on healthy eating.  His BMI is >27, but he has a lean muscle mass which I think accounts for much of his weight. EKG performed today and personally reviewed- NSR, no ischemic changes.

## 2012-08-12 NOTE — Patient Instructions (Addendum)
Please call to schedule a flu shot this fall.   Complete lab work prior to leaving.     Preventive Care for Adults, Male A healthy lifestyle and preventative care can promote health and wellness. Preventative health guidelines for men include the following key practices:  A routine yearly physical is a good way to check with your caregiver about your health and preventative screening. It is a chance to share any concerns and updates on your health, and to receive a thorough exam.   Visit your dentist for a routine exam and preventative care every 6 months. Brush your teeth twice a day and floss once a day. Good oral hygiene prevents tooth decay and gum disease.   The frequency of eye exams is based on your age, health, family medical history, use of contact lenses, and other factors. Follow your caregiver's recommendations for frequency of eye exams.   Eat a healthy diet. Foods like vegetables, fruits, whole grains, low-fat dairy products, and lean protein foods contain the nutrients you need without too many calories. Decrease your intake of foods high in solid fats, added sugars, and salt. Eat the right amount of calories for you.Get information about a proper diet from your caregiver, if necessary.   Regular physical exercise is one of the most important things you can do for your health. Most adults should get at least 150 minutes of moderate-intensity exercise (any activity that increases your heart rate and causes you to sweat) each week. In addition, most adults need muscle-strengthening exercises on 2 or more days a week.   Maintain a healthy weight. The body mass index (BMI) is a screening tool to identify possible weight problems. It provides an estimate of body fat based on height and weight. Your caregiver can help determine your BMI, and can help you achieve or maintain a healthy weight.For adults 20 years and older:   A BMI below 18.5 is considered underweight.   A BMI of 18.5  to 24.9 is normal.   A BMI of 25 to 29.9 is considered overweight.   A BMI of 30 and above is considered obese.   Maintain normal blood lipids and cholesterol levels by exercising and minimizing your intake of saturated fat. Eat a balanced diet with plenty of fruit and vegetables. Blood tests for lipids and cholesterol should begin at age 19 and be repeated every 5 years. If your lipid or cholesterol levels are high, you are over 50, or you are a high risk for heart disease, you may need your cholesterol levels checked more frequently.Ongoing high lipid and cholesterol levels should be treated with medicines if diet and exercise are not effective.   If you smoke, find out from your caregiver how to quit. If you do not use tobacco, do not start.   If you choose to drink alcohol, do not exceed 2 drinks per day. One drink is considered to be 12 ounces (355 mL) of beer, 5 ounces (148 mL) of wine, or 1.5 ounces (44 mL) of liquor.   Avoid use of street drugs. Do not share needles with anyone. Ask for help if you need support or instructions about stopping the use of drugs.   High blood pressure causes heart disease and increases the risk of stroke. Your blood pressure should be checked at least every 1 to 2 years. Ongoing high blood pressure should be treated with medicines, if weight loss and exercise are not effective.   If you are 45 to 41 years  old, ask your caregiver if you should take aspirin to prevent heart disease.   Diabetes screening involves taking a blood sample to check your fasting blood sugar level. This should be done once every 3 years, after age 86, if you are within normal weight and without risk factors for diabetes. Testing should be considered at a younger age or be carried out more frequently if you are overweight and have at least 1 risk factor for diabetes.   Colorectal cancer can be detected and often prevented. Most routine colorectal cancer screening begins at the age of  66 and continues through age 15. However, your caregiver may recommend screening at an earlier age if you have risk factors for colon cancer. On a yearly basis, your caregiver may provide home test kits to check for hidden blood in the stool. Use of a small camera at the end of a tube, to directly examine the colon (sigmoidoscopy or colonoscopy), can detect the earliest forms of colorectal cancer. Talk to your caregiver about this at age 63, when routine screening begins. Direct examination of the colon should be repeated every 5 to 10 years through age 17, unless early forms of pre-cancerous polyps or small growths are found.   Hepatitis C blood testing is recommended for all people born from [birth year] through 1965 and any individual with known risks for hepatitis C.   Practice safe sex. Use condoms and avoid high-risk sexual practices to reduce the spread of sexually transmitted infections (STIs). STIs include gonorrhea, chlamydia, syphilis, trichomonas, herpes, HPV, and human immunodeficiency virus (HIV). Herpes, HIV, and HPV are viral illnesses that have no cure. They can result in disability, cancer, and death.   A one-time screening for abdominal aortic aneurysm (AAA) and surgical repair of large AAAs by sound wave imaging (ultrasonography) is recommended for ages 76 to 58 years who are current or former smokers.   Healthy men should no longer receive prostate-specific antigen (PSA) blood tests as part of routine cancer screening. Consult with your caregiver about prostate cancer screening.   Testicular cancer screening is not recommended for adult males who have no symptoms. Screening includes self-exam, caregiver exam, and other screening tests. Consult with your caregiver about any symptoms you have or any concerns you have about testicular cancer.   Use sunscreen with skin protection factor (SPF) of 30 or more. Apply sunscreen liberally and repeatedly throughout the day. You should seek shade when  your shadow is shorter than you. Protect yourself by wearing long sleeves, pants, a wide-brimmed hat, and sunglasses year round, whenever you are outdoors.   Once a month, do a whole body skin exam, using a mirror to look at the skin on your back. Notify your caregiver of new moles, moles that have irregular borders, moles that are larger than a pencil eraser, or moles that have changed in shape or color.   Stay current with required immunizations.   Influenza. You need a dose every fall (or winter). The composition of the flu vaccine changes each year, so being vaccinated once is not enough.   Pneumococcal polysaccharide. You need 1 to 2 doses if you smoke cigarettes or if you have certain chronic medical conditions. You need 1 dose at age 17 (or older) if you have never been vaccinated.   Tetanus, diphtheria, pertussis (Tdap, Td). Get 1 dose of Tdap vaccine if you are younger than age 38 years, are over 59 and have contact with an infant, are a Research scientist (physical sciences), or simply want  to be protected from whooping cough. After that, you need a Td booster dose every 10 years. Consult your caregiver if you have not had at least 3 tetanus and diphtheria-containing shots sometime in your life or have a deep or dirty wound.   HPV. This vaccine is recommended for males 13 through 41 years of age. This vaccine may be given to men 22 through 41 years of age who have not completed the 3 dose series. It is recommended for men through age 57 who have sex with men or whose immune system is weakened because of HIV infection, other illness, or medications. The vaccine is given in 3 doses over 6 months.   Measles, mumps, rubella (MMR). You need at least 1 dose of MMR if you were born in 1957 or later. You may also need a 2nd dose.   Meningococcal. If you are age 65 to 44 years and a Orthoptist living in a residence hall, or have one of several medical conditions, you need to get vaccinated against  meningococcal disease. You may also need additional booster doses.   Zoster (shingles). If you are age 82 years or older, you should get this vaccine.   Varicella (chickenpox). If you have never had chickenpox or you were vaccinated but received only 1 dose, talk to your caregiver to find out if you need this vaccine.   Hepatitis A. You need this vaccine if you have a specific risk factor for hepatitis A virus infection, or you simply wish to be protected from this disease. The vaccine is usually given as 2 doses, 6 to 18 months apart.   Hepatitis B. You need this vaccine if you have a specific risk factor for hepatitis B virus infection or you simply wish to be protected from this disease. The vaccine is given in 3 doses, usually over 6 months.  Preventative Service / Frequency Ages 20 to 71  Blood pressure check.** / Every 1 to 2 years.   Lipid and cholesterol check.** / Every 5 years beginning at age 48.   Hepatitis C blood test.** / For any individual with known risks for hepatitis C.   Skin self-exam. / Monthly.   Influenza immunization.** / Every year.   Pneumococcal polysaccharide immunization.** / 1 to 2 doses if you smoke cigarettes or if you have certain chronic medical conditions.   Tetanus, diphtheria, pertussis (Tdap,Td) immunization. / A one-time dose of Tdap vaccine. After that, you need a Td booster dose every 10 years.   HPV immunization. / 3 doses over 6 months, if 26 and younger.   Measles, mumps, rubella (MMR) immunization. / You need at least 1 dose of MMR if you were born in 1957 or later. You may also need a 2nd dose.   Meningococcal immunization. / 1 dose if you are age 42 to 12 years and a Orthoptist living in a residence hall, or have one of several medical conditions, you need to get vaccinated against meningococcal disease. You may also need additional booster doses.   Varicella immunization.** / Consult your caregiver.   Hepatitis A  immunization.** / Consult your caregiver. 2 doses, 6 to 18 months apart.   Hepatitis B immunization.** / Consult your caregiver. 3 doses usually over 6 months.  Ages 54 to 9  Blood pressure check.** / Every 1 to 2 years.   Lipid and cholesterol check.** / Every 5 years beginning at age 66.   Fecal occult blood test (FOBT) of stool. /  Every year beginning at age 52 and continuing until age 62. You may not have to do this test if you get colonoscopy every 10 years.   Flexible sigmoidoscopy** or colonoscopy.** / Every 5 years for a flexible sigmoidoscopy or every 10 years for a colonoscopy beginning at age 50 and continuing until age 53.   Hepatitis C blood test.** / For all people born from [birth year] through 1965 and any individual with known risks for hepatitis C.   Skin self-exam. / Monthly.   Influenza immunization.** / Every year.   Pneumococcal polysaccharide immunization.** / 1 to 2 doses if you smoke cigarettes or if you have certain chronic medical conditions.   Tetanus, diphtheria, pertussis (Tdap/Td) immunization.** / A one-time dose of Tdap vaccine. After that, you need a Td booster dose every 10 years.   Measles, mumps, rubella (MMR) immunization. / You need at least 1 dose of MMR if you were born in 1957 or later. You may also need a 2nd dose.   Varicella immunization.**/ Consult your caregiver.   Meningococcal immunization.** / Consult your caregiver.   Hepatitis A immunization.** / Consult your caregiver. 2 doses, 6 to 18 months apart.   Hepatitis B immunization.** / Consult your caregiver. 3 doses, usually over 6 months.  Ages 58 and over  Blood pressure check.** / Every 1 to 2 years.   Lipid and cholesterol check.**/ Every 5 years beginning at age 91.   Fecal occult blood test (FOBT) of stool. / Every year beginning at age 47 and continuing until age 13. You may not have to do this test if you get colonoscopy every 10 years.   Flexible sigmoidoscopy** or  colonoscopy.** / Every 5 years for a flexible sigmoidoscopy or every 10 years for a colonoscopy beginning at age 64 and continuing until age 85.   Hepatitis C blood test.** / For all people born from [birth year] through 1965 and any individual with known risks for hepatitis C.   Abdominal aortic aneurysm (AAA) screening.** / A one-time screening for ages 41 to 49 years who are current or former smokers.   Skin self-exam. / Monthly.   Influenza immunization.** / Every year.   Pneumococcal polysaccharide immunization.** / 1 dose at age 60 (or older) if you have never been vaccinated.   Tetanus, diphtheria, pertussis (Tdap, Td) immunization. / A one-time dose of Tdap vaccine if you are over 65 and have contact with an infant, are a Research scientist (physical sciences), or simply want to be protected from whooping cough. After that, you need a Td booster dose every 10 years.   Varicella immunization. ** / Consult your caregiver.   Meningococcal immunization.** / Consult your caregiver.   Hepatitis A immunization. ** / Consult your caregiver. 2 doses, 6 to 18 months apart.   Hepatitis B immunization.** / Check with your caregiver. 3 doses, usually over 6 months.  **Family history and personal history of risk and conditions may change your caregiver's recommendations. Document Released: 02/09/2002 Document Revised: 12/03/2011 Document Reviewed: 05/11/2011 [facility] Patient Information 2012 [place], [place].

## 2012-08-12 NOTE — Assessment & Plan Note (Signed)
Rx provided to pt for empiric ceftin to have on hand PRN illness.

## 2012-08-12 NOTE — Progress Notes (Signed)
Subjective:    Patient ID: Darin Smith, male    DOB: 1971/12/21, 41 y.o.   MRN: [id number]  HPI  Mr.  Smith is a 41 yr old male who presents today for his annual physical.  He is fasting. Pt up to date with colonoscopy, tetanus and pneumovax. Not exercising.  Has a physical job.  Diet is pretty healthy.  Elevated PSA- follows with Dr. Brunilda Payor.  He will see him again the week before Christmas.    Review of Systems  Constitutional: Negative for unexpected weight change.  HENT: Negative for hearing loss and congestion.   Eyes: Negative for visual disturbance.  Respiratory: Negative for cough and shortness of breath.   Cardiovascular: Negative for chest pain.  Gastrointestinal: Negative for vomiting, diarrhea, constipation and blood in stool.  Genitourinary: Negative for dysuria, urgency and frequency.  Musculoskeletal: Negative for arthralgias.       Reports occasional "knot" in back Plantar fascitis  Skin:       Denies moles or rashes  Neurological:       Rare headaches  Hematological:       Reports occasional swollen gland in groin area  Psychiatric/Behavioral:       Denies depression/anxiety   Past Medical History  Diagnosis Date  . Rupture spleen age 50    hx of abdominal trauma    History   Social History  . Marital Status: Legally Separated    Spouse Name: N/A    Number of Children: N/A  . Years of Education: N/A   Occupational History  . Not on file.   Social History Main Topics  . Smoking status: Never Smoker   . Smokeless tobacco: Never Used  . Alcohol Use: 2.4 oz/week    4 Cans of beer per week  . Drug Use: No  . Sexually Active: Not on file   Other Topics Concern  . Not on file   Social History Narrative   Regular exercise:  [employer] driverCaffeine Use: 1 soda/teaDivorced, son shares time between pt and his ex wife.     Past Surgical History  Procedure Date  . Splenectomy   . Biopsy prostate 5/13    Dr. Su Grand- negative    Family History    Problem Relation Age of Onset  . Lupus Mother   . Vision loss Mother     in left eye  . Hypertension Father   . Obesity Sister   . Thyroid disease Sister   . Cancer Neg Hx     prostate  . Multiple sclerosis Sister     No Known Allergies  Current Outpatient Prescriptions on File Prior to Visit  Medication Sig Dispense Refill  . naproxen sodium (ALEVE) 220 MG tablet Take 220 mg by mouth as needed.          BP 110/78  Pulse 73  Temp 98.1 F (36.7 C) (Oral)  Resp 16  Ht 6\' 1"  (1.854 m)  Wt 210 lb (95.255 kg)  BMI 27.71 kg/m2  SpO2 99%       Objective:   Physical Exam  Physical Exam  Constitutional: He is oriented to person, place, and time. He appears well-developed and well-nourished. No distress.  HENT:  Head: Normocephalic and atraumatic.  Right Ear: Tympanic membrane and ear canal normal.  Left Ear: Tympanic membrane and normal. Small skin tag in left ear canal. Mouth/Throat: Oropharynx is clear and moist.  Eyes: Pupils are equal, round, and reactive to light. No scleral icterus.  Neck: Normal range of motion. No thyromegaly present.  Cardiovascular: Normal rate and regular rhythm.   No murmur heard. Pulmonary/Chest: Effort normal and breath sounds normal. No respiratory distress. He has no wheezes. He has no rales. He exhibits no tenderness.  Abdominal: Soft. Bowel sounds are normal. He exhibits no distension and no mass. There is no tenderness. There is no rebound and no guarding.  Musculoskeletal: He exhibits no edema.  Lymphadenopathy:    He has no cervical adenopathy. No inguinal adenopathy. Neurological: He is alert and oriented to person, place, and time. He has normal reflexes. He exhibits normal muscle tone. Coordination normal.  Skin: Skin is warm and dry.  Psychiatric: He has a normal mood and affect. His behavior is normal. Judgment and thought content normal.  GU: prostate exam deferred to Urology        Assessment & Plan:           Assessment & Plan:

## 2012-08-13 LAB — URINALYSIS, ROUTINE W REFLEX MICROSCOPIC
Hgb urine dipstick: NEGATIVE
Leukocytes, UA: NEGATIVE
Protein, ur: NEGATIVE mg/dL
Urobilinogen, UA: 0.2 mg/dL (ref 0.0–1.0)

## 2012-08-13 LAB — URINALYSIS, MICROSCOPIC ONLY: Squamous Epithelial / LPF: NONE SEEN

## 2012-08-16 ENCOUNTER — Encounter: Payer: Self-pay | Admitting: Family

## 2013-06-14 ENCOUNTER — Other Ambulatory Visit (HOSPITAL_COMMUNITY): Payer: Self-pay | Admitting: Urology

## 2013-06-14 DIAGNOSIS — R972 Elevated prostate specific antigen [PSA]: Secondary | ICD-10-CM

## 2013-06-26 ENCOUNTER — Encounter: Payer: Self-pay | Admitting: Family

## 2013-06-29 ENCOUNTER — Ambulatory Visit (HOSPITAL_COMMUNITY)
Admission: RE | Admit: 2013-06-29 | Discharge: 2013-06-29 | Disposition: A | Discharge status: Home / Self Care | Payer: BC Managed Care – PPO | Source: Ambulatory Visit | Attending: Urology | Admitting: Urology

## 2013-07-07 ENCOUNTER — Ambulatory Visit (HOSPITAL_COMMUNITY)
Admission: RE | Admit: 2013-07-07 | Discharge: 2013-07-07 | Disposition: A | Discharge status: Home / Self Care | Payer: BC Managed Care – PPO | Source: Ambulatory Visit | Attending: Urology | Admitting: Urology

## 2013-07-07 DIAGNOSIS — R972 Elevated prostate specific antigen [PSA]: Secondary | ICD-10-CM | POA: Insufficient documentation

## 2013-07-07 DIAGNOSIS — R9389 Abnormal findings on diagnostic imaging of other specified body structures: Secondary | ICD-10-CM | POA: Insufficient documentation

## 2013-07-07 MED ORDER — GADOBENATE DIMEGLUMINE 529 MG/ML IV SOLN
20.0000 mL | Freq: Once | INTRAVENOUS | Status: AC | PRN
  Administered 2013-07-07: 20 mL via INTRAVENOUS

## 2013-07-24 ENCOUNTER — Encounter: Payer: Self-pay | Admitting: Family

## 2013-08-07 ENCOUNTER — Ambulatory Visit (INDEPENDENT_AMBULATORY_CARE_PROVIDER_SITE_OTHER): Payer: BC Managed Care – PPO | Admitting: Sports Medicine

## 2013-08-07 ENCOUNTER — Encounter: Payer: Self-pay | Admitting: Sports Medicine

## 2013-08-07 VITALS — BP 142/95 | HR 66 | Ht 74.0 in | Wt 215.0 lb

## 2013-08-07 DIAGNOSIS — M629 Disorder of muscle, unspecified: Secondary | ICD-10-CM

## 2013-08-07 DIAGNOSIS — S93699A Other sprain of unspecified foot, initial encounter: Secondary | ICD-10-CM | POA: Insufficient documentation

## 2013-08-07 HISTORY — DX: Other sprain of unspecified foot, initial encounter: S93.699A

## 2013-08-07 MED ORDER — TRAMADOL HCL 50 MG PO TABS: 50.0000 mg | ORAL_TABLET | Freq: Four times a day (QID) | ORAL | Status: DC | PRN

## 2013-08-07 NOTE — Patient Instructions (Addendum)
It was nice to see you today, thanks for coming in!  Problem List Items Addressed This Visit   Plantar fascia rupture - Primary     Boot for 2-3 weeks for comfort. Tramadol refilled today Ice 2-3X per day for comfort - light duty @ [employer] > will need cushioned insoles after follow up     Relevant Medications      Tramadol (ULTRAM) 50 mg po tab       Please plan to return to see Dr. Margaretha Sheffield on 08/29/13.  If you need anything prior to your next visit please call the clinic.  Please Bring all medications or accurate medication list with you to each appointment; an accurate medication list is essential in providing you the best care possible.

## 2013-08-07 NOTE — Assessment & Plan Note (Addendum)
Pt likely had a traumatic partial rupture of medial insertion of PF, also consider avulsion fx of calcaneal spur.  X-ray report from [facility] Reviewed via [facility] with mention of calcaneal spurring but no acute fracture.  I have asked the patient to bring a copy of these X-rays for further review.    Boot for 2-3 weeks for comfort. Tramadol refilled today Ice 2-3X per day for comfort Patient will be out of work until f/u in 2-3 weeks

## 2013-08-07 NOTE — Progress Notes (Signed)
  [facility]  Patient name: Darin Smith MRN [id number]  Date of birth: 09-16-1971  CC & HPI:  Darin Smith is a 42 y.o. male presenting today to Establish Care.   Acute Concerns addressed today:  Chief Complaint  Patient presents with  . 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  Immediate pain.  Continues pain with weight bearing.  No pain without weight bearing.  Hx of Plantar fascia for >4 years.  No bruising, no swelling.  Went to ED in [place], [place] without acute fracture.  Able to use foot normally just hurts to put weight on heel  Onset/Duration:  3 days ago  Illicit ing Event/Injury:  Yes - walking on 08/04/13  Severity:  none unless weight bearing then severe  Aggravating:  weight bearing  Effective Therapies:  tramadol but sedated  Inneffective Therapies:  rest  RED FLAGS & ROS:  no falls, no significant bruising No weight loss, recurrent injuries.  No Pain in Right heal, hx of custom orthotics for plantar fascitis        ROS:  Per HPI   Pertinent History Reviewed:  Medical & Surgical Hx:   His chronic active medical problems include: Patient Active Problem List   Diagnosis Date Noted  .  08/07/2013  . GERD (gastroesophageal reflux disease) 08/12/2012  . Elevated PSA 08/12/2012  . Preventative health care 08/04/2011  . GASTRITIS 12/31/2010  . HEMOCCULT POSITIVE STOOL 07/02/2010  . NOCTURIA 05/14/2009  . SKIN TAG 08/21/2008  . 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 Authorizing Provider  naproxen sodium (ALEVE) 220 MG tablet Take 220 mg by mouth as needed.     Yes Historical Provider, MD  traMADol (ULTRAM) 50 MG tablet Take 1 tablet (50 mg total) by mouth every 6 (six) hours as needed for pain. 08/07/13   Andrena Mews, DO    Social History: Reviewed -   reports that he has never smoked. He has never used smokeless tobacco.  Objective Findings:  Vitals: BP 142/95  Pulse 66  Ht 6\' 2"  (1.88 m)  Wt 215 lb (97.523 kg)  BMI 27.59 kg/m2  PE: GENERAL: Adult well built AA  male. In no discomfort; no respiratory distress  PSYCH:  alert and appropriate, good insight      NeuroMSK Left Foot  Exam: Appearance:  normal appearing, no deformity, no bruising Preserved longituinal arch at rest  Palpation:  TTP on medial insertion of plantar fascia, no palpable defect, NO TTP over achilles, midfoot, talar dome, peroneals or medial/lateral malleoli or 5th  ROM: Active Passive  FULL Foot ROM B     Neurovascular:   warm well perfused, cap refill <2sec Foot strength 5+/5 in all motions  MSK Testing:  negative Thompson test Negative homans, no cords no unilateral calf swelling R>L longitudinal arch breakdown with weight bearing but no splay toe; good posterior tib recruitment   MSK Ultrasound:  Thickened R PF to 0.58cm L <0.5cm.  R PF with hypoechoic region with evidence of increased vascularity on medial insertion of calcaneous with difficulty following fibers distally.  ?calcification of calcaneous on intact PF         Assessment & Plan:  See problem associated charting

## 2013-08-29 ENCOUNTER — Ambulatory Visit (INDEPENDENT_AMBULATORY_CARE_PROVIDER_SITE_OTHER): Payer: BC Managed Care – PPO | Admitting: Sports Medicine

## 2013-08-29 VITALS — BP 141/86 | Ht 73.0 in | Wt 215.0 lb

## 2013-08-29 DIAGNOSIS — M629 Disorder of muscle, unspecified: Secondary | ICD-10-CM

## 2013-08-29 DIAGNOSIS — S93699A Other sprain of unspecified foot, initial encounter: Secondary | ICD-10-CM

## 2013-08-29 NOTE — Progress Notes (Signed)
  Subjective:    Patient ID: Darin Smith, male    DOB: 1971/12/11, 42 y.o.   MRN: [id number]  HPI Patient comes in today for followup on left foot plantar fascial rupture. Doing very well. He states he is about "85% better". He has been able to transition out of his boot. He has weaned down from his naproxen sodium and tramadol. He has been out of work for the past 3 weeks.    Review of Systems     Objective:   Physical Exam Well-developed, well-nourished. No acute distress  Left foot: There is no tenderness to palpation along the course of the plantar fascia. No pain with plantar fascial stretching. Patient able to stand on his tiptoes without difficulty. He has a well-preserved longitudinal arch with no evidence of collapse. Neurovascular intact distally. Walking without significant limp.       Assessment & Plan:  1. Improved left foot plantar fascial tear  Patient is making excellent clinical progress. I still think he needs to wait another week or so before returning to work. I've given him an arch strap as well as green sports insoles with scaphoid pads. He can start to increase activity as tolerated. Followup when necessary.

## 2013-09-22 ENCOUNTER — Ambulatory Visit (INDEPENDENT_AMBULATORY_CARE_PROVIDER_SITE_OTHER): Payer: BC Managed Care – PPO | Admitting: Family

## 2013-09-22 ENCOUNTER — Encounter: Payer: Self-pay | Admitting: Family

## 2013-09-22 VITALS — BP 126/90 | HR 61 | Temp 98.0°F | Resp 16 | Ht 73.0 in | Wt 223.1 lb

## 2013-09-22 DIAGNOSIS — Z0000 Encounter for general adult medical examination without abnormal findings: Secondary | ICD-10-CM

## 2013-09-22 DIAGNOSIS — Z23 Encounter for immunization: Secondary | ICD-10-CM

## 2013-09-22 LAB — CBC WITH DIFFERENTIAL/PLATELET
Basophils Relative: 0 % (ref 0–1)
Eosinophils Absolute: 0.2 10*3/uL (ref 0.0–0.7)
Eosinophils Relative: 3 % (ref 0–5)
Hemoglobin: 16.2 g/dL (ref 13.0–17.0)
MCH: 31.4 pg (ref 26.0–34.0)
MCHC: 35.4 g/dL (ref 30.0–36.0)
MCV: 88.8 fL (ref 78.0–100.0)
Monocytes Relative: 8 % (ref 3–12)
Neutrophils Relative %: 62 % (ref 43–77)

## 2013-09-22 LAB — BASIC METABOLIC PANEL WITH GFR
BUN: 9 mg/dL (ref 6–23)
Calcium: 9.4 mg/dL (ref 8.4–10.5)
GFR, Est African American: >89 mL/min
Glucose, Bld: 84 mg/dL (ref 70–99)
Potassium: 4.6 mEq/L (ref 3.5–5.3)

## 2013-09-22 LAB — LIPID PANEL
Cholesterol: 194 mg/dL (ref 0–200)
LDL Cholesterol: 118 mg/dL — ABNORMAL HIGH (ref 0–99)
VLDL: 23 mg/dL (ref 0–40)

## 2013-09-22 LAB — TSH: TSH: 1.563 u[IU]/mL (ref 0.350–4.500)

## 2013-09-22 LAB — HEPATIC FUNCTION PANEL
AST: 21 U/L (ref 0–37)
Albumin: 4.1 g/dL (ref 3.5–5.2)
Bilirubin, Direct: 0.1 mg/dL (ref 0.0–0.3)
Total Bilirubin: 0.7 mg/dL (ref 0.3–1.2)

## 2013-09-22 MED ORDER — RANITIDINE HCL 150 MG PO TABS: 150.0000 mg | ORAL_TABLET | Freq: Two times a day (BID) | ORAL | Status: DC

## 2013-09-22 NOTE — Patient Instructions (Addendum)
Please complete your lab work prior to leaving. Work on increasing your exercise to 30 minutes 5 days a week.  Follow up in 1 year, sooner if problems/concerns.

## 2013-09-22 NOTE — Assessment & Plan Note (Signed)
42 yr old male presents for CPX today. Reinforced importance of healthy diet, exercise, modest weight loss.  Obtain fasting labs.  Flu shot today.  Fasting labs.    Advised pt to increase OTC zantac to bid for GERD symptoms- advised on reflux diet.

## 2013-09-22 NOTE — Progress Notes (Signed)
Subjective:    Patient ID: [place], male    DOB: [birth year], 42 y.o.   MRN: [id number]  HPI  Patient presents today for complete physical.  Immunizations:  Due for flu shot. Diet: not eating healthy diet.   Exercise: Reports that he is exercising 1-2 times a week.   gerd- takes zantac which helps.  Had 3rd biopsy of prostate. Neg per patient.   Review of Systems  Constitutional:       + weight gain last few months  HENT: Negative for hearing loss and congestion.   Eyes: Negative for visual disturbance.  Respiratory: Negative for cough and shortness of breath.   Cardiovascular: Negative for chest pain.  Gastrointestinal: Negative for nausea, vomiting and diarrhea.  Genitourinary: Negative for dysuria and frequency.       Tore a tendon in his foot 7 weeks ago.  Went to ER in [place].  Went to sports medicine at [facility] and was told that he had a tear.  Reports resolution of plantar fasciitis symptoms  Musculoskeletal: Negative for joint swelling and arthralgias.  Skin: Negative for rash.  Neurological: Negative for headaches.  Hematological: Negative for adenopathy.  Psychiatric/Behavioral:       Denies depression/anxiety   Past Medical History  Diagnosis Date  . 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 Marital Status: Divorced    Spouse Name: N/A    Number of Children: N/A  . Years of Education: N/A   Occupational History  . Not on file.   Social History Main Topics  . Smoking status: Never Smoker   . Smokeless tobacco: Never Used  . Alcohol Use: 2.4 oz/week    4 Cans of beer per week  . Drug Use: No  . Sexual Activity: Not on file   Other Topics Concern  . 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 They live in [place]                Past Surgical History  Procedure Laterality Date  .  Splenectomy    . Biopsy prostate  5/13    Dr. Su Grand- negative    Family History  Problem Relation Age of Onset  . Lupus Mother   . Vision loss Mother     in left eye  . Hypertension Father   . Obesity Sister   . Thyroid disease Sister   . Cancer Neg Hx     prostate  . Multiple sclerosis Sister     No Known Allergies  No current outpatient prescriptions on file prior to visit.   No current facility-administered medications on file prior to visit.    BP 126/90  Pulse 61  Temp(Src) 98 F (36.7 C) (Oral)  Resp 16  Ht 6\' 1"  (1.854 m)  Wt 223 lb 1.9 oz (101.207 kg)  BMI 29.44 kg/m2  SpO2 98%       Objective:   Physical Exam  Physical Exam  Constitutional: Pleasant, AA male. He is oriented to person, place, and time. He appears well-developed and well-nourished. No distress.  HENT:  Head: Normocephalic and atraumatic.  Right Ear: Tympanic membrane and ear canal normal.  Left Ear: Tympanic membrane and ear canal normal.  Mouth/Throat: Oropharynx is clear and moist.  Eyes: Pupils are  equal, round, and reactive to light. No scleral icterus.  Neck: Normal range of motion. No thyromegaly present.  Cardiovascular: Normal rate and regular rhythm.   No murmur heard. Pulmonary/Chest: Effort normal and breath sounds normal. No respiratory distress. He has no wheezes. He has no rales. He exhibits no tenderness.  Abdominal: Soft. Bowel sounds are normal. He exhibits no distension and no mass. There is no tenderness. There is no rebound and no guarding.  Musculoskeletal: He exhibits no edema.  Lymphadenopathy:    He has no cervical adenopathy.  Neurological: He is alert and oriented to person, place, and time.  He exhibits normal muscle tone. Coordination normal.  Skin: Skin is warm and dry.  Psychiatric: He has a normal mood and affect. His behavior is normal. Judgment and thought content normal.          Assessment & Plan:         Assessment & Plan:

## 2013-09-23 LAB — URINALYSIS, ROUTINE W REFLEX MICROSCOPIC
Glucose, UA: NEGATIVE mg/dL
Leukocytes, UA: NEGATIVE
Nitrite: NEGATIVE
Specific Gravity, Urine: 1.012 (ref 1.005–1.030)
pH: 5.5 (ref 5.0–8.0)

## 2013-09-23 LAB — URINALYSIS, MICROSCOPIC ONLY: Bacteria, UA: NONE SEEN

## 2013-09-25 ENCOUNTER — Encounter: Payer: Self-pay | Admitting: Family

## 2014-03-13 ENCOUNTER — Telehealth: Payer: Self-pay | Admitting: *Deleted

## 2014-03-13 NOTE — Telephone Encounter (Signed)
Pt left message requesting refill on his "antibiotic"?  Attempted to reach pt to clarify what antibiotic he is requesting and why but was unable to leave message.  Will try again tomorrow.

## 2014-03-19 MED ORDER — CEFUROXIME AXETIL 500 MG PO TABS: 500.0000 mg | ORAL_TABLET | Freq: Two times a day (BID) | ORAL | Status: DC

## 2014-03-19 NOTE — Telephone Encounter (Signed)
Rx sent to his pharmacy for ceftin. Follow up if symptoms worsen, if he develops fever, or if not resolved in 3 days.

## 2014-03-19 NOTE — Telephone Encounter (Signed)
Notified pt and he voices understanding. 

## 2014-03-19 NOTE — Telephone Encounter (Signed)
Spoke with pt. He states he has had swollen glands in his neck x 1 week. Also cut his leg on metal last week. 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  Pt denies redness or swelling at abrasion. Last tetanus shot was 2006.  Please advise.

## 2014-06-20 ENCOUNTER — Other Ambulatory Visit: Payer: Self-pay | Admitting: Family

## 2014-06-22 MED ORDER — RANITIDINE HCL 150 MG PO TABS: 150.0000 mg | ORAL_TABLET | Freq: Two times a day (BID) | ORAL | Status: DC

## 2014-09-24 ENCOUNTER — Encounter: Payer: Self-pay | Admitting: Family

## 2014-09-24 ENCOUNTER — Other Ambulatory Visit: Payer: Self-pay | Admitting: *Deleted

## 2014-09-24 ENCOUNTER — Ambulatory Visit (INDEPENDENT_AMBULATORY_CARE_PROVIDER_SITE_OTHER): Payer: BC Managed Care – PPO | Admitting: Family

## 2014-09-24 VITALS — BP 158/102 | Temp 98.2°F | Ht 74.0 in | Wt 220.2 lb

## 2014-09-24 DIAGNOSIS — I10 Essential (primary) hypertension: Secondary | ICD-10-CM

## 2014-09-24 DIAGNOSIS — Z0000 Encounter for general adult medical examination without abnormal findings: Secondary | ICD-10-CM

## 2014-09-24 DIAGNOSIS — Z23 Encounter for immunization: Secondary | ICD-10-CM

## 2014-09-24 LAB — URINALYSIS, ROUTINE W REFLEX MICROSCOPIC
Bilirubin Urine: NEGATIVE
HGB URINE DIPSTICK: NEGATIVE
Ketones, ur: NEGATIVE
Leukocytes, UA: NEGATIVE
NITRITE: NEGATIVE
RBC / HPF: NONE SEEN (ref 0–?)
Specific Gravity, Urine: 1.02 (ref 1.000–1.030)
Total Protein, Urine: NEGATIVE
URINE GLUCOSE: NEGATIVE
Urobilinogen, UA: 0.2 (ref 0.0–1.0)
pH: 5.5 (ref 5.0–8.0)

## 2014-09-24 LAB — CBC WITH DIFFERENTIAL/PLATELET
Basophils Absolute: 0 10*3/uL (ref 0.0–0.1)
Basophils Relative: 0.4 % (ref 0.0–3.0)
EOS ABS: 0.1 10*3/uL (ref 0.0–0.7)
EOS PCT: 2.1 % (ref 0.0–5.0)
HCT: 47.3 % (ref 39.0–52.0)
Hemoglobin: 15.8 g/dL (ref 13.0–17.0)
Lymphocytes Relative: 31.5 % (ref 12.0–46.0)
Lymphs Abs: 1.9 10*3/uL (ref 0.7–4.0)
MCHC: 33.3 g/dL (ref 30.0–36.0)
MCV: 92.8 fl (ref 78.0–100.0)
MONO ABS: 0.4 10*3/uL (ref 0.1–1.0)
Monocytes Relative: 6.4 % (ref 3.0–12.0)
Neutro Abs: 3.5 10*3/uL (ref 1.4–7.7)
Neutrophils Relative %: 59.6 % (ref 43.0–77.0)
PLATELETS: 277 10*3/uL (ref 150.0–400.0)
RBC: 5.1 Mil/uL (ref 4.22–5.81)
RDW: 13 % (ref 11.5–15.5)
WBC: 5.9 10*3/uL (ref 4.0–10.5)

## 2014-09-24 LAB — BASIC METABOLIC PANEL
BUN: 12 mg/dL (ref 6–23)
CO2: 22 mEq/L (ref 19–32)
Calcium: 9.1 mg/dL (ref 8.4–10.5)
Chloride: 104 mEq/L (ref 96–112)
Creatinine, Ser: 1.1 mg/dL (ref 0.4–1.5)
GFR: 93 mL/min (ref >60.00)
GLUCOSE: 91 mg/dL (ref 70–99)
POTASSIUM: 3.7 meq/L (ref 3.5–5.1)
SODIUM: 135 meq/L (ref 135–145)

## 2014-09-24 LAB — LIPID PANEL
CHOLESTEROL: 204 mg/dL — AB (ref 0–200)
HDL: 44.6 mg/dL (ref >39.00)
LDL Cholesterol: 128 mg/dL — ABNORMAL HIGH (ref 0–99)
NonHDL: 159.4
Total CHOL/HDL Ratio: 5
Triglycerides: 159 mg/dL — ABNORMAL HIGH (ref 0.0–149.0)
VLDL: 31.8 mg/dL (ref 0.0–40.0)

## 2014-09-24 LAB — TSH: TSH: 2.74 u[IU]/mL (ref 0.35–4.50)

## 2014-09-24 MED ORDER — AMLODIPINE BESYLATE 5 MG PO TABS: 5.0000 mg | ORAL_TABLET | Freq: Every day | ORAL | Status: DC

## 2014-09-24 NOTE — Assessment & Plan Note (Signed)
Discussed low sodium diet. Will initiate amlodipine. Follow up in 1 month.

## 2014-09-24 NOTE — Progress Notes (Signed)
Pre visit review using our clinic review tool, if applicable. No additional management support is needed unless otherwise documented below in the visit note. 

## 2014-09-24 NOTE — Addendum Note (Signed)
Addended by: Kelle Darting A on: 09/24/2014 09:11 AM   Modules accepted: Orders

## 2014-09-24 NOTE — Assessment & Plan Note (Signed)
>>  ASSESSMENT AND PLAN FOR ROUTINE GENERAL MEDICAL EXAMINATION AT A HEALTH CARE FACILITY WRITTEN ON 09/24/2014  7:55 AM BY O'SULLIVAN, Najai Waszak, NP  Discussed healthy diet.  Tdap and flu shot today. Obtain fasting labs.  Urology is following him for elevated PSA.

## 2014-09-24 NOTE — Addendum Note (Signed)
Addended by: Peggyann Shoals on: 09/24/2014 08:01 AM   Modules accepted: Orders

## 2014-09-24 NOTE — Patient Instructions (Signed)
Start amlodipine for blood pressure. Complete lab work prior to leaving, follow up in 1 month.

## 2014-09-24 NOTE — Progress Notes (Signed)
Subjective:    Patient ID: [place], male    DOB: 1971/11/21, 43 y.o.   MRN: [id number]  HPI  Patient presents today for complete physical.  Immunizations: Td and flu shot today.  Diet: reports fair diet Exercise: some, active in job Colonoscopy: due age 58  Wt Readings from Last 3 Encounters:  09/24/14 220 lb 3.2 oz (99.882 kg)  09/22/13 223 lb 1.9 oz (101.207 kg)  08/29/13 215 lb (97.523 kg)   BP Readings from Last 3 Encounters:  09/22/13 126/90  08/29/13 141/86  08/07/13 142/95     Review of Systems  Constitutional: Negative for unexpected weight change.  HENT: Negative for rhinorrhea.   Respiratory: Negative for cough and shortness of breath.   Cardiovascular: Negative for chest pain.  Gastrointestinal: Negative for nausea, vomiting, diarrhea and constipation.  Genitourinary: Negative for dysuria and frequency.  Musculoskeletal: Negative for arthralgias and myalgias.  Skin: Negative for rash.  Neurological: Negative for headaches.  Hematological: Negative for adenopathy.  Psychiatric/Behavioral:       Denies depression/anxiety   Past Medical History  Diagnosis Date  . 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 Marital Status: Divorced    Spouse Name: N/A    Number of Children: N/A  . Years of Education: N/A   Occupational History  . Not on file.   Social History Main Topics  . Smoking status: Never Smoker   . Smokeless tobacco: Never Used  . Alcohol Use: 2.4 oz/week    4 Cans of beer per week  . Drug Use: No  . Sexual Activity: Not on file   Other Topics Concern  . 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 They live in [place]                Past Surgical History  Procedure Laterality Date  . Splenectomy    . Biopsy prostate  5/13    Dr. Lowella Bandy- negative    Family History    Problem Relation Age of Onset  . Lupus Mother   . Vision loss Mother     in left eye  . Hypertension Father   . Obesity Sister   . Thyroid disease Sister   . Cancer Neg Hx     prostate  . Multiple sclerosis Sister     No Known Allergies  Current Outpatient Prescriptions on File Prior to Visit  Medication Sig Dispense Refill  . ranitidine (ZANTAC) 150 MG tablet Take 1 tablet (150 mg total) by mouth 2 (two) times daily.  60 tablet  3   No current facility-administered medications on file prior to visit.    Temp(Src) 98.2 F (36.8 C) (Oral)  Ht 6\' 2"  (1.88 m)  Wt 220 lb 3.2 oz (99.882 kg)  BMI 28.26 kg/m2       Objective:   Physical Exam   Physical Exam  Constitutional: He is oriented to person, place, and time. He appears well-developed and well-nourished. No distress.  HENT:  Head: Normocephalic and atraumatic.  Right Ear: Tympanic membrane and ear canal normal.  Left Ear: Tympanic membrane and ear canal normal.  Mouth/Throat: Oropharynx is clear and moist.  Eyes: Pupils are equal, round, and reactive to light. No scleral icterus.  Neck: Normal range of  motion. No thyromegaly present.  Cardiovascular: Normal rate and regular rhythm.   No murmur heard. Pulmonary/Chest: Effort normal and breath sounds normal. No respiratory distress. He has no wheezes. He has no rales. He exhibits no tenderness.  Abdominal: Soft. Bowel sounds are normal. He exhibits no distension and no mass. There is no tenderness. There is no rebound and no guarding.  Musculoskeletal: He exhibits no edema.  Lymphadenopathy:    He has no cervical adenopathy.  Neurological: He is alert and oriented to person, place, and time. R patellar reflex is 2+, L patellar reflex- difficult to elicit- pt was tense. He exhibits normal muscle tone. Coordination normal.  Skin: Skin is warm and dry.  Psychiatric: He has a normal mood and affect. His behavior is normal. Judgment and thought content normal.           Assessment & Plan:        Assessment & Plan:

## 2014-09-24 NOTE — Assessment & Plan Note (Signed)
Discussed healthy diet.  Tdap and flu shot today. Obtain fasting labs.  Urology is following him for elevated PSA.

## 2014-09-27 ENCOUNTER — Encounter: Payer: Self-pay | Admitting: Family

## 2014-10-11 ENCOUNTER — Encounter: Payer: Self-pay | Admitting: Family

## 2014-10-24 ENCOUNTER — Ambulatory Visit (INDEPENDENT_AMBULATORY_CARE_PROVIDER_SITE_OTHER): Payer: BC Managed Care – PPO | Admitting: Family

## 2014-10-24 ENCOUNTER — Encounter: Payer: Self-pay | Admitting: Family

## 2014-10-24 VITALS — BP 130/86 | HR 78 | Temp 98.6°F | Resp 18 | Ht 74.0 in | Wt 221.0 lb

## 2014-10-24 DIAGNOSIS — I10 Essential (primary) hypertension: Secondary | ICD-10-CM

## 2014-10-24 DIAGNOSIS — L02416 Cutaneous abscess of left lower limb: Secondary | ICD-10-CM

## 2014-10-24 MED ORDER — AMLODIPINE BESYLATE 5 MG PO TABS
5.0000 mg | ORAL_TABLET | Freq: Every day | ORAL | Status: DC
Start: 2014-10-24 — End: 2014-11-14

## 2014-10-24 MED ORDER — CEPHALEXIN 500 MG PO CAPS: 500.0000 mg | ORAL_CAPSULE | Freq: Three times a day (TID) | ORAL | Status: DC

## 2014-10-24 NOTE — Assessment & Plan Note (Signed)
Improved, continue amlodipine.

## 2014-10-24 NOTE — Patient Instructions (Signed)
Start Keflex for boil on your thigh, apply warm compresses twice daily to promote drainage. Call if symptoms worsen or if symptoms do not improve.  Continue amlodipine. Follow up in 3 months.

## 2014-10-24 NOTE — Progress Notes (Signed)
   Subjective:    Patient ID: Darin Smith, male    DOB: [birth year], 43 y.o.   MRN: [id number]  HPI  [name] is a 43 yr old male who presents today for follow up.  1) HTN- last visit amlodipine was initiated due to elevated BP.  Reports that he has some occasional LE edema after being on feet all day, but notes that this occurred prior to starting amlodipine.  BP Readings from Last 3 Encounters:  10/24/14 130/86  09/24/14 158/102  09/22/13 126/90   2) Cyst- reported swelling on the left inner thigh since last night. Area is tender.   Review of Systems See HPI  Past Medical History  Diagnosis Date  . Rupture spleen age 89    hx of abdominal trauma    History   Social History  . Marital Status: Divorced    Spouse Name: N/A    Number of Children: N/A  . Years of Education: N/A   Occupational History  . Not on file.   Social History Main Topics  . Smoking status: Never Smoker   . Smokeless tobacco: Never Used  . Alcohol Use: 2.4 oz/week    4 Cans of beer per week  . Drug Use: No  . Sexual Activity: Not on file   Other Topics Concern  . 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 They live in [place] 7/15                   Past Surgical History  Procedure Laterality Date  . Splenectomy    . Biopsy prostate  5/13    Dr. Lowella Bandy- negative    Family History  Problem Relation Age of Onset  . Lupus Mother   . Vision loss Mother     in left eye  . Hypertension Father   . Obesity Sister   . Thyroid disease Sister   . Cancer Neg Hx     prostate  . Multiple sclerosis Sister     No Known Allergies  Current Outpatient Prescriptions on File Prior to Visit  Medication Sig Dispense Refill  . ranitidine (ZANTAC) 150 MG tablet Take 1 tablet (150 mg total) by mouth 2 (two) times daily.  60 tablet  3   No current  facility-administered medications on file prior to visit.    BP 130/86  Pulse 78  Temp(Src) 98.6 F (37 C) (Oral)  Resp 18  Ht 6\' 2"  (1.88 m)  Wt 221 lb (100.245 kg)  BMI 28.36 kg/m2  SpO2 97%       Objective:   Physical Exam  Constitutional: He appears well-developed and well-nourished. No distress.  Cardiovascular: Normal rate and regular rhythm.   No murmur heard. Pulmonary/Chest: Effort normal and breath sounds normal. No respiratory distress. He has no wheezes. He has no rales. He exhibits no tenderness.  Musculoskeletal: He exhibits no edema.  Neurological: He is alert.  Skin: Skin is warm and dry.  Tender, pea sized nodule noted beneath skin on left upper/inner thigh  Psychiatric: He has a normal mood and affect. His behavior is normal. Judgment and thought content normal.          Assessment & Plan:

## 2014-10-24 NOTE — Assessment & Plan Note (Signed)
Will rx with keflex.   Apply warm compresses twice daily to promote drainage. Call if symptoms worsen or if symptoms do not improve.

## 2014-11-14 ENCOUNTER — Telehealth: Payer: Self-pay | Admitting: *Deleted

## 2014-11-14 MED ORDER — AMLODIPINE BESYLATE 5 MG PO TABS: 5.0000 mg | ORAL_TABLET | Freq: Every day | ORAL | Status: DC

## 2014-11-14 NOTE — Telephone Encounter (Signed)
Received fax from [facility] requesting 90 day supply of amlodipine. Rx sent.

## 2014-11-25 ENCOUNTER — Other Ambulatory Visit: Payer: Self-pay | Admitting: Family

## 2015-01-09 ENCOUNTER — Encounter: Payer: Self-pay | Admitting: Family

## 2015-01-09 ENCOUNTER — Ambulatory Visit (INDEPENDENT_AMBULATORY_CARE_PROVIDER_SITE_OTHER): Payer: BLUE CROSS/BLUE SHIELD | Admitting: Family

## 2015-01-09 VITALS — BP 110/80 | HR 73 | Temp 97.1°F | Resp 16 | Ht 74.0 in | Wt 219.0 lb

## 2015-01-09 DIAGNOSIS — L02416 Cutaneous abscess of left lower limb: Secondary | ICD-10-CM

## 2015-01-09 DIAGNOSIS — Z9081 Acquired absence of spleen: Secondary | ICD-10-CM

## 2015-01-09 DIAGNOSIS — I10 Essential (primary) hypertension: Secondary | ICD-10-CM

## 2015-01-09 MED ORDER — CEFUROXIME AXETIL 500 MG PO TABS: ORAL_TABLET | ORAL | Status: DC

## 2015-01-09 NOTE — Patient Instructions (Addendum)
Follow up in 6 months 

## 2015-01-09 NOTE — Assessment & Plan Note (Signed)
Resolved without draining.

## 2015-01-09 NOTE — Progress Notes (Signed)
Subjective:    Patient ID: Darin Smith, male    DOB: Dec 25, 1971, 44 y.o.   MRN: [id number]  HPI Dar[place] is here today for follow up for HTN.  1. HYPERTENSION: Reports compliance with amlodipine. Runs in 110s over 80 at home.  The patient denies the following associated symptoms: Chest pain, dyspnea, blurred vision, headache. He occasionally  Has lower extremity edema  . BP Readings from Last 3 Encounters:  01/09/15 110/80  10/24/14 130/86  09/24/14 158/102   2. 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 He is inquiring about antibiotics to have on hand in case of an open cut or injury due to his decreased ability to fight infection. Pneumovax up to date.  3. Cyst on left inner thigh discussed at October 2015 visit eventually resolved without draining.   Review of Systems  Eyes: Negative for visual disturbance.  Respiratory: Negative for shortness of breath.   Cardiovascular: Positive for leg swelling. Negative for chest pain.  Neurological: Negative for headaches.   Past Medical History  Diagnosis Date  . Rupture spleen age 53    hx of abdominal trauma    History   Social History  . Marital Status: Divorced    Spouse Name: N/A    Number of Children: N/A  . Years of Education: N/A   Occupational History  . Not on file.   Social History Main Topics  . Smoking status: Never Smoker   . Smokeless tobacco: Never Used  . Alcohol Use: 2.4 oz/week    4 Cans of beer per week  . Drug Use: No  . Sexual Activity: Not on file   Other Topics Concern  . 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 They live in [place]                   Past Surgical History  Procedure Laterality Date  . Splenectomy    . Biopsy prostate  5/13    Dr. Lowella Bandy- negative    Family History  Problem Relation Age of Onset  . Lupus  Mother   . Vision loss Mother     in left eye  . Hypertension Father   . Obesity Sister   . Thyroid disease Sister   . Cancer Neg Hx     prostate  . Multiple sclerosis Sister     No Known Allergies  Current Outpatient Prescriptions on File Prior to Visit  Medication Sig Dispense Refill  . amLODipine (NORVASC) 5 MG tablet Take 1 tablet (5 mg total) by mouth daily. 90 tablet 0  . ranitidine (ZANTAC) 150 MG tablet Take 1 tablet (150 mg total) by mouth 2 (two) times daily. 60 tablet 3   No current facility-administered medications on file prior to visit.    BP 110/80 mmHg  Pulse 73  Temp(Src) 97.1 F (36.2 C) (Oral)  Resp 16  Ht 6\' 2"  (1.88 m)  Wt 219 lb (99.338 kg)  BMI 28.11 kg/m2  SpO2 96%        Objective:   Physical Exam  Constitutional: He is oriented to person, place, and time. He appears well-developed and well-nourished. No distress.  HENT:  Head: Normocephalic and atraumatic.  Cardiovascular: Normal rate, regular rhythm and normal heart sounds.  Exam reveals no gallop and no  friction rub.   No murmur heard. Pulmonary/Chest: Effort normal and breath sounds normal. No respiratory distress. He has no wheezes. He has no rales.  Musculoskeletal: He exhibits no edema.  Neurological: He is alert and oriented to person, place, and time.  Skin: Skin is warm and dry. He is not diaphoretic.  Psychiatric: He has a normal mood and affect. His behavior is normal. Judgment and thought content normal.          Assessment & Plan:  Patient seen along with [facility] NP-student.  I have personally seen and examined patient and agree with Ms. Whitmire's assessment and plan- Debbrah Alar NP

## 2015-01-09 NOTE — Assessment & Plan Note (Signed)
Rx for Ceftin to have on hand for infections.

## 2015-01-09 NOTE — Assessment & Plan Note (Signed)
BP stable on amlodipine. Continue at current dose. Will do BMET at next visit.

## 2015-02-23 ENCOUNTER — Other Ambulatory Visit: Payer: Self-pay | Admitting: Family

## 2015-02-25 NOTE — Telephone Encounter (Signed)
Rx request to pharmacy/[facility]  

## 2015-03-07 ENCOUNTER — Encounter: Payer: Self-pay | Admitting: Family

## 2015-03-07 ENCOUNTER — Other Ambulatory Visit: Payer: Self-pay

## 2015-03-07 MED ORDER — RANITIDINE HCL 150 MG PO TABS: 150.0000 mg | ORAL_TABLET | Freq: Two times a day (BID) | ORAL | Status: DC

## 2015-03-27 ENCOUNTER — Other Ambulatory Visit (HOSPITAL_COMMUNITY): Payer: Self-pay | Admitting: Urology

## 2015-03-27 DIAGNOSIS — R972 Elevated prostate specific antigen [PSA]: Secondary | ICD-10-CM

## 2015-04-12 ENCOUNTER — Ambulatory Visit (HOSPITAL_COMMUNITY): Payer: BLUE CROSS/BLUE SHIELD

## 2015-04-24 ENCOUNTER — Ambulatory Visit (HOSPITAL_COMMUNITY)
Admission: RE | Admit: 2015-04-24 | Discharge: 2015-04-24 | Disposition: A | Discharge status: Home / Self Care | Payer: BLUE CROSS/BLUE SHIELD | Source: Ambulatory Visit | Attending: Urology | Admitting: Urology

## 2015-04-24 ENCOUNTER — Telehealth: Payer: Self-pay | Admitting: *Deleted

## 2015-04-24 DIAGNOSIS — R972 Elevated prostate specific antigen [PSA]: Secondary | ICD-10-CM

## 2015-04-24 LAB — POCT I-STAT CREATININE: CREATININE: 1.3 mg/dL (ref 0.50–1.35)

## 2015-04-24 MED ORDER — GADOBENATE DIMEGLUMINE 529 MG/ML IV SOLN
20.0000 mL | Freq: Once | INTRAVENOUS | Status: AC | PRN
  Administered 2015-04-24: 20 mL via INTRAVENOUS

## 2015-04-24 MED ORDER — RANITIDINE HCL 150 MG PO TABS: 150.0000 mg | ORAL_TABLET | Freq: Two times a day (BID) | ORAL | Status: DC

## 2015-04-24 NOTE — Telephone Encounter (Signed)
Received request for ran[facility]idine from [facility] for 90 day supply.  Refill sent.

## 2015-06-03 ENCOUNTER — Other Ambulatory Visit: Payer: Self-pay | Admitting: Family

## 2015-06-05 ENCOUNTER — Ambulatory Visit (INDEPENDENT_AMBULATORY_CARE_PROVIDER_SITE_OTHER): Payer: BLUE CROSS/BLUE SHIELD | Admitting: Family

## 2015-06-05 ENCOUNTER — Encounter: Payer: Self-pay | Admitting: Family

## 2015-06-05 VITALS — BP 138/84 | HR 77 | Temp 97.7°F | Resp 16 | Ht 74.0 in | Wt 215.4 lb

## 2015-06-05 DIAGNOSIS — R972 Elevated prostate specific antigen [PSA]: Secondary | ICD-10-CM

## 2015-06-05 DIAGNOSIS — Z23 Encounter for immunization: Secondary | ICD-10-CM

## 2015-06-05 DIAGNOSIS — K219 Gastro-esophageal reflux disease without esophagitis: Secondary | ICD-10-CM | POA: Diagnosis not present

## 2015-06-05 DIAGNOSIS — I10 Essential (primary) hypertension: Secondary | ICD-10-CM

## 2015-06-05 DIAGNOSIS — Z9081 Acquired absence of spleen: Secondary | ICD-10-CM | POA: Diagnosis not present

## 2015-06-05 MED ORDER — AMLODIPINE BESYLATE 5 MG PO TABS: 5.0000 mg | ORAL_TABLET | Freq: Every day | ORAL | Status: DC

## 2015-06-05 MED ORDER — RANITIDINE HCL 150 MG PO TABS: 150.0000 mg | ORAL_TABLET | Freq: Two times a day (BID) | ORAL | Status: DC

## 2015-06-05 NOTE — Patient Instructions (Signed)
Please check blood pressure at home a few times a week.  Let me know if your are seeing multiple blood pressure readings >140/90.  Please schedule a follow up appointment in 3 months.

## 2015-06-05 NOTE — Assessment & Plan Note (Signed)
Stable on zantac, continue same.

## 2015-06-05 NOTE — Assessment & Plan Note (Signed)
BP is a bit higher today than typically. We discussed having the patient monitor his BP periodically at home and let me know if he is getting readings >140/90.

## 2015-06-05 NOTE — Assessment & Plan Note (Signed)
Prevnar today.

## 2015-06-05 NOTE — Addendum Note (Signed)
Addended by: Debbrah Alar on: 06/05/2015 07:29 AM   Modules accepted: Miquel Dunn

## 2015-06-05 NOTE — Progress Notes (Signed)
Pre visit review using our clinic review tool, if applicable. No additional management support is needed unless otherwise documented below in the visit note. 

## 2015-06-05 NOTE — Progress Notes (Signed)
Subjective:    Patient ID: [place], male    DOB: [birth year], 44 y.o.   MRN: [id number]  HPI   [place]arin [place] is a 44 yr old male who presents today for follow up of hypertension.  Patient is currently maintained on the following medications for blood pressure: amlodipine 5mg .  Patient reports good compliance with blood pressure medications. Patient denies chest pain, shortness of breath or swelling. Last 3 blood pressure readings in our office are as follows: BP Readings from Last 3 Encounters:  06/05/15 140/92  01/09/15 110/80  10/24/14 130/86   GERD- continues zantac bid.  Has not been able to tolerate coming off of zantac.    Hx of elevated PSA- follows with Dr. Janice Norrie, has had 2 negative prostate biopsies in the past.  Saw him back in March.  Pt reports that he had a normal MRI of the prostate.      Review of Systems See HPI  Past Medical History  Diagnosis Date  . 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 Marital Status: Married    Spouse Name: N/A  . Number of Children: N/A  . Years of Education: N/A   Occupational History  . Not on file.   Social History Main Topics  . Smoking status: Never Smoker   . Smokeless tobacco: Never Used  . Alcohol Use: 2.4 oz/week    4 Cans of beer per week  . Drug Use: No  . Sexual Activity: Not on file   Other Topics Concern  . 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 They live in [place] 7/15                   Past Surgical History  Procedure Laterality Date  . Splenectomy    . Biopsy prostate  5/13    Dr. Lowella Bandy- negative    Family History  Problem Relation Age of Onset  . Lupus Mother   . Vision loss Mother     in left eye  . Hypertension Father   . Obesity Sister   . Thyroid disease Sister   . Cancer Neg Hx     prostate  . Multiple  sclerosis Sister     No Known Allergies  Current Outpatient Prescriptions on File Prior to Visit  Medication Sig Dispense Refill  . amLODipine (NORVASC) 5 MG tablet TAKE 1 TABLET BY MOUTH DAILY 90 tablet 1  . cefUROXime (CEFTIN) 500 MG tablet One tab twice daily in case of illness/fever 14 tablet 1  . ranitidine (ZANTAC) 150 MG tablet Take 1 tablet (150 mg total) by mouth 2 (two) times daily. 180 tablet 0   No current facility-administered medications on file prior to visit.    BP 140/92 mmHg  Pulse 77  Temp(Src) 97.7 F (36.5 C) (Oral)  Resp 16  Ht 6\' 2"  (1.88 m)  Wt 215 lb 6.4 oz (97.705 kg)  BMI 27.64 kg/m2  SpO2 97%       Objective:   Physical Exam  Constitutional: He is oriented to person, place, and time. He appears well-developed and well-nourished. No distress.  HENT:  Head: Normocephalic and atraumatic.  Cardiovascular: Normal rate and regular rhythm.   No murmur heard. Pulmonary/Chest: Effort normal  and breath sounds normal. No respiratory distress. He has no wheezes. He has no rales.  Musculoskeletal: He exhibits no edema.  Neurological: He is alert and oriented to person, place, and time.  Skin: Skin is warm and dry.  Psychiatric: He has a normal mood and affect. His behavior is normal. Thought content normal.          Assessment & Plan:

## 2015-06-05 NOTE — Addendum Note (Signed)
Addended by: Kelle Darting A on: 06/05/2015 07:36 AM   Modules accepted: Orders

## 2015-06-05 NOTE — Assessment & Plan Note (Signed)
Stable, management per Urology (pt reports recent negative MRI).

## 2015-06-13 ENCOUNTER — Encounter: Payer: Self-pay | Admitting: Gastroenterology

## 2015-07-28 ENCOUNTER — Encounter: Payer: Self-pay | Admitting: Family

## 2015-07-29 MED ORDER — AMLODIPINE BESYLATE 5 MG PO TABS: 5.0000 mg | ORAL_TABLET | Freq: Every day | ORAL | Status: DC

## 2015-07-29 NOTE — Telephone Encounter (Signed)
Gilmore Laroche, could you please call in rx? thanks

## 2015-09-11 ENCOUNTER — Encounter: Payer: Self-pay | Admitting: Family

## 2015-09-26 ENCOUNTER — Telehealth: Payer: Self-pay | Admitting: Behavioral Health

## 2015-09-26 ENCOUNTER — Encounter: Payer: Self-pay | Admitting: Behavioral Health

## 2015-09-26 NOTE — Telephone Encounter (Signed)
Pre-Visit Call completed with patient and chart updated.   Pre-Visit Info documented in Specialty Comments under [employer].    

## 2015-09-27 ENCOUNTER — Ambulatory Visit (INDEPENDENT_AMBULATORY_CARE_PROVIDER_SITE_OTHER): Payer: BLUE CROSS/BLUE SHIELD | Admitting: Family

## 2015-09-27 ENCOUNTER — Encounter: Payer: Self-pay | Admitting: Family

## 2015-09-27 VITALS — BP 130/89 | HR 66 | Temp 98.0°F | Resp 16 | Ht 74.0 in | Wt 217.0 lb

## 2015-09-27 DIAGNOSIS — Z23 Encounter for immunization: Secondary | ICD-10-CM | POA: Diagnosis not present

## 2015-09-27 DIAGNOSIS — Z0000 Encounter for general adult medical examination without abnormal findings: Secondary | ICD-10-CM | POA: Diagnosis not present

## 2015-09-27 LAB — URINALYSIS, ROUTINE W REFLEX MICROSCOPIC
Bilirubin Urine: NEGATIVE
HGB URINE DIPSTICK: NEGATIVE
Ketones, ur: NEGATIVE
LEUKOCYTES UA: NEGATIVE
NITRITE: NEGATIVE
RBC / HPF: NONE SEEN (ref 0–?)
Specific Gravity, Urine: >=1.03 — AB (ref 1.000–1.030)
Total Protein, Urine: NEGATIVE
Urine Glucose: NEGATIVE
Urobilinogen, UA: 0.2 (ref 0.0–1.0)
WBC UA: NONE SEEN (ref 0–?)
pH: 6 (ref 5.0–8.0)

## 2015-09-27 LAB — BASIC METABOLIC PANEL
BUN: 15 mg/dL (ref 6–23)
CALCIUM: 9.4 mg/dL (ref 8.4–10.5)
CO2: 30 meq/L (ref 19–32)
CREATININE: 1.14 mg/dL (ref 0.40–1.50)
Chloride: 105 mEq/L (ref 96–112)
GFR: 89.76 mL/min (ref >60.00)
Glucose, Bld: 89 mg/dL (ref 70–99)
Potassium: 4 mEq/L (ref 3.5–5.1)
SODIUM: 141 meq/L (ref 135–145)

## 2015-09-27 LAB — LIPID PANEL
Cholesterol: 192 mg/dL (ref 0–200)
HDL: 50.4 mg/dL (ref >39.00)
LDL Cholesterol: 124 mg/dL — ABNORMAL HIGH (ref 0–99)
NonHDL: 141.25
TRIGLYCERIDES: 85 mg/dL (ref 0.0–149.0)
Total CHOL/HDL Ratio: 4
VLDL: 17 mg/dL (ref 0.0–40.0)

## 2015-09-27 LAB — HEPATIC FUNCTION PANEL
ALK PHOS: 53 U/L (ref 39–117)
ALT: 25 U/L (ref 0–53)
AST: 24 U/L (ref 0–37)
Albumin: 4.4 g/dL (ref 3.5–5.2)
BILIRUBIN DIRECT: 0.2 mg/dL (ref 0.0–0.3)
BILIRUBIN TOTAL: 0.9 mg/dL (ref 0.2–1.2)
Total Protein: 7.4 g/dL (ref 6.0–8.3)

## 2015-09-27 LAB — CBC WITH DIFFERENTIAL/PLATELET
BASOS ABS: 0 10*3/uL (ref 0.0–0.1)
Basophils Relative: 0.3 % (ref 0.0–3.0)
EOS ABS: 0.1 10*3/uL (ref 0.0–0.7)
Eosinophils Relative: 2.2 % (ref 0.0–5.0)
HCT: 47.6 % (ref 39.0–52.0)
Hemoglobin: 16 g/dL (ref 13.0–17.0)
LYMPHS ABS: 1.5 10*3/uL (ref 0.7–4.0)
Lymphocytes Relative: 26.2 % (ref 12.0–46.0)
MCHC: 33.6 g/dL (ref 30.0–36.0)
MCV: 91.8 fl (ref 78.0–100.0)
MONOS PCT: 6.6 % (ref 3.0–12.0)
Monocytes Absolute: 0.4 10*3/uL (ref 0.1–1.0)
NEUTROS ABS: 3.6 10*3/uL (ref 1.4–7.7)
NEUTROS PCT: 64.7 % (ref 43.0–77.0)
PLATELETS: 315 10*3/uL (ref 150.0–400.0)
RBC: 5.18 Mil/uL (ref 4.22–5.81)
RDW: 13.1 % (ref 11.5–15.5)
WBC: 5.6 10*3/uL (ref 4.0–10.5)

## 2015-09-27 LAB — TSH: TSH: 0.91 u[IU]/mL (ref 0.35–4.50)

## 2015-09-27 MED ORDER — CEFUROXIME AXETIL 500 MG PO TABS: ORAL_TABLET | ORAL | Status: DC

## 2015-09-27 MED ORDER — RANITIDINE HCL 150 MG PO TABS: 150.0000 mg | ORAL_TABLET | Freq: Two times a day (BID) | ORAL | Status: DC

## 2015-09-27 MED ORDER — AMLODIPINE BESYLATE 5 MG PO TABS: 5.0000 mg | ORAL_TABLET | Freq: Every day | ORAL | Status: DC

## 2015-09-27 NOTE — Patient Instructions (Addendum)
Please complete lab work prior to leaving. Try to add regular exercise to your routine, and decrease fast food. Focus diet on fresh fruits/veggies. Follow up in 6 months.

## 2015-09-27 NOTE — Progress Notes (Signed)
Pre visit review using our clinic review tool, if applicable. No additional management support is needed unless otherwise documented below in the visit note. 

## 2015-09-27 NOTE — Assessment & Plan Note (Signed)
>>  ASSESSMENT AND PLAN FOR ROUTINE GENERAL MEDICAL EXAMINATION AT A HEALTH CARE FACILITY WRITTEN ON 09/27/2015  7:53 AM BY O'SULLIVAN, Desani Sprung, NP  Flu shot today. Refilled abx to keep on hand for infection due to hx of splenectomy.  Discussed healthy diet, exercise, modest weight loss.  Goal weight about 200- he is quite muscular.

## 2015-09-27 NOTE — Assessment & Plan Note (Signed)
Flu shot today. Refilled abx to keep on hand for infection due to hx of splenectomy.  Discussed healthy diet, exercise, modest weight loss.  Goal weight about 200- he is quite muscular.

## 2015-09-27 NOTE — Progress Notes (Addendum)
Subjective:    Patient ID: Darin Smith, male    DOB: 1971-09-13, 44 y.o.   MRN: [id number]  HPI  Darin Smith is a 44 yr old male who presents today for cpx.    Patient presents today for complete physical.  Immunizations: Tetanus up to date, pneumovax up to date, flu shot today Diet: fair Exercise: no formal exercise Colonoscopy: up to date (2016) Eye exam:  3 yrs ago Dental:  Up to date   Review of Systems  Constitutional: Negative for unexpected weight change.  HENT: Negative for hearing loss and rhinorrhea.   Eyes: Negative for visual disturbance.  Respiratory: Negative for cough.   Cardiovascular: Negative for leg swelling.  Gastrointestinal: Negative for diarrhea and constipation.  Genitourinary: Negative for dysuria and frequency.  Musculoskeletal: Negative for myalgias and arthralgias.  Skin: Negative for rash.  Neurological: Negative for headaches.  Hematological: Negative for adenopathy.  Psychiatric/Behavioral:       Denies depression/anxiety   Wt Readings from Last 3 Encounters:  09/27/15 217 lb (98.431 kg)  06/05/15 215 lb 6.4 oz (97.705 kg)  01/09/15 219 lb (99.338 kg)       Past Medical History  Diagnosis Date  . Rupture spleen age 47    hx of abdominal trauma    Social History   Social History  . Marital Status: Married    Spouse Name: N/A  . Number of Children: N/A  . Years of Education: N/A   Occupational History  . Not on file.   Social History Main Topics  . Smoking status: Current Every Day Smoker  . Smokeless tobacco: Never Used     Comment: 1-2 cigars a day  . Alcohol Use: 0.6 oz/week    1 Cans of beer per week  . Drug Use: No  . Sexual Activity: Not on file   Other Topics Concern  . 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 They live in [place] 7/15                   Past Surgical History   Procedure Laterality Date  . Splenectomy    . Biopsy prostate  5/13    Dr. Lowella Bandy- negative    Family History  Problem Relation Age of Onset  . Lupus Mother   . Vision loss Mother     in left eye  . Hypertension Father   . Obesity Sister   . Thyroid disease Sister   . Cancer Neg Hx     prostate  . Multiple sclerosis Sister     No Known Allergies  Current Outpatient Prescriptions on File Prior to Visit  Medication Sig Dispense Refill  . amLODipine (NORVASC) 5 MG tablet Take 1 tablet (5 mg total) by mouth daily. 5 tablet 0  . ranitidine (ZANTAC) 150 MG tablet Take 1 tablet (150 mg total) by mouth 2 (two) times daily. 180 tablet 1   No current facility-administered medications on file prior to visit.    Pulse 66  Temp(Src) 98 F (36.7 C) (Oral)  Resp 16  Ht 6\' 2"  (1.88 m)  Wt 217 lb (98.431 kg)  BMI 27.85 kg/m2  SpO2 99%    Objective:   Physical Exam  Physical Exam  Constitutional: He is oriented to person, place, and time. He appears well-developed and  well-nourished. No distress.  HENT:  Head: Normocephalic and atraumatic.  Right Ear: Tympanic membrane and ear canal normal.  Left Ear: Tympanic membrane and ear canal normal.  Mouth/Throat: Oropharynx is clear and moist.  Eyes: Pupils are equal, round, and reactive to light. No scleral icterus.  Neck: Normal range of motion. No thyromegaly present.  Cardiovascular: Normal rate and regular rhythm.   No murmur heard. Pulmonary/Chest: Effort normal and breath sounds normal. No respiratory distress. He has no wheezes. He has no rales. He exhibits no tenderness.  Abdominal: Soft. Bowel sounds are normal. He exhibits no distension and no mass. There is no tenderness. There is no rebound and no guarding.  Musculoskeletal: He exhibits no edema.  Lymphadenopathy:    He has no cervical adenopathy.  Neurological: He is alert and oriented to person, place, and time. He exhibits normal muscle tone. Coordination normal.   Skin: Skin is warm and dry.  Psychiatric: He has a normal mood and affect. His behavior is normal. Judgment and thought content normal.          Assessment & Plan:         Assessment & Plan:  Preventative- Immunizations reviewed and up to date.  Continue healthy diet, exercise.  Obtain routine labs.   EKG is performed today- computer reads as follows: Anterolateral ST-elevation -repolarization variant. I have personally reviewed EKG and compared to EKG 9/14- EKG appears unchanged.

## 2015-09-29 ENCOUNTER — Encounter: Payer: Self-pay | Admitting: Family

## 2015-09-29 NOTE — Addendum Note (Signed)
Addended by: Debbrah Alar on: 09/29/2015 02:13 PM   Modules accepted: Miquel Dunn

## 2016-03-18 ENCOUNTER — Telehealth: Payer: Self-pay | Admitting: *Deleted

## 2016-03-18 MED ORDER — AMLODIPINE BESYLATE 5 MG PO TABS: 5.0000 mg | ORAL_TABLET | Freq: Every day | ORAL | Status: DC

## 2016-03-18 NOTE — Telephone Encounter (Signed)
Received request from [facility] for [facility] refill. Pt has f/u 03/27/16.  Refill sent.

## 2016-03-27 ENCOUNTER — Ambulatory Visit: Payer: BLUE CROSS/BLUE SHIELD | Admitting: Family

## 2016-04-22 ENCOUNTER — Encounter: Payer: Self-pay | Admitting: Family

## 2016-04-22 ENCOUNTER — Ambulatory Visit (INDEPENDENT_AMBULATORY_CARE_PROVIDER_SITE_OTHER): Payer: BLUE CROSS/BLUE SHIELD | Admitting: Family

## 2016-04-22 VITALS — BP 130/86 | HR 76 | Temp 98.3°F | Resp 16 | Ht 74.0 in | Wt 215.4 lb

## 2016-04-22 DIAGNOSIS — I10 Essential (primary) hypertension: Secondary | ICD-10-CM | POA: Diagnosis not present

## 2016-04-22 DIAGNOSIS — J309 Allergic rhinitis, unspecified: Secondary | ICD-10-CM | POA: Diagnosis not present

## 2016-04-22 DIAGNOSIS — K219 Gastro-esophageal reflux disease without esophagitis: Secondary | ICD-10-CM | POA: Diagnosis not present

## 2016-04-22 MED ORDER — RANITIDINE HCL 150 MG PO TABS: 150.0000 mg | ORAL_TABLET | Freq: Two times a day (BID) | ORAL | Status: DC

## 2016-04-22 MED ORDER — AMLODIPINE BESYLATE 5 MG PO TABS: 5.0000 mg | ORAL_TABLET | Freq: Every day | ORAL | Status: DC

## 2016-04-22 NOTE — Assessment & Plan Note (Signed)
BP is stable on current meds.  Obtain bmet. 

## 2016-04-22 NOTE — Progress Notes (Signed)
Pre visit review using our clinic review tool, if applicable. No additional management support is needed unless otherwise documented below in the visit note. 

## 2016-04-22 NOTE — Progress Notes (Signed)
Subjective:    Patient ID: Darin Smith, male    DOB: 06-03-1971, 45 y.o.   MRN: [id number]  HPI  Darin Smith is a 45 yr old male who presents today for follow up.  HTN- pt is maintained on amlodipine.  BP Readings from Last 3 Encounters:  04/22/16 130/86  09/27/15 130/89  06/05/15 138/84   GERD- maintained on zantac bid.  Notes that he needs tums 2-3 times a week.  This is usually related to dietary indiscretion.   Allergic rhinitis- reports that he has been using claritin D.    Review of Systems  Constitutional: Negative for fatigue and unexpected weight change.  Respiratory: Negative for shortness of breath and wheezing.   Cardiovascular: Negative for chest pain, palpitations and leg swelling.       Past Medical History  Diagnosis Date  . Rupture spleen age 45    hx of abdominal trauma     Social History   Social History  . Marital Status: Married    Spouse Name: N/A  . Number of Children: N/A  . Years of Education: N/A   Occupational History  . Not on file.   Social History Main Topics  . Smoking status: Current Every Day Smoker  . Smokeless tobacco: Never Used     Comment: 1 cigar a day  . Alcohol Use: 0.6 oz/week    1 Cans of beer per week  . Drug Use: No  . Sexual Activity: Not on file   Other Topics Concern  . 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 They live in [place] 7/15                   Past Surgical History  Procedure Laterality Date  . Splenectomy    . Biopsy prostate  5/13    Dr. Lowella Bandy- negative    Family History  Problem Relation Age of Onset  . Lupus Mother   . Vision loss Mother     in left eye  . Hypertension Father   . Obesity Sister   . Thyroid disease Sister   . Cancer Neg Hx     prostate  . Multiple sclerosis Sister     No Known Allergies  Current Outpatient Prescriptions  on File Prior to Visit  Medication Sig Dispense Refill  . amLODipine (NORVASC) 5 MG tablet Take 1 tablet (5 mg total) by mouth daily. 90 tablet 1  . ranitidine (ZANTAC) 150 MG tablet Take 1 tablet (150 mg total) by mouth 2 (two) times daily. 180 tablet 1   No current facility-administered medications on file prior to visit.    BP 130/86 mmHg  Pulse 76  Temp(Src) 98.3 F (36.8 C) (Oral)  Resp 16  Ht 6\' 2"  (1.88 m)  Wt 215 lb 6.4 oz (97.705 kg)  BMI 27.64 kg/m2  SpO2 100%    Objective:   Physical Exam  Constitutional: He is oriented to person, place, and time. He appears well-developed and well-nourished. No distress.  HENT:  Head: Normocephalic and atraumatic.  Cardiovascular: Normal rate and regular rhythm.   No murmur heard. Pulmonary/Chest: Effort normal and breath sounds normal. No respiratory distress. He has no wheezes. He has no rales.  Musculoskeletal: He exhibits no edema.  Neurological: He is alert and oriented to person, place, and  time.  Skin: Skin is warm and dry.  Psychiatric: He has a normal mood and affect. His behavior is normal. Thought content normal.          Assessment & Plan:

## 2016-04-22 NOTE — Assessment & Plan Note (Signed)
Stable on zantac, continue same.  

## 2016-04-22 NOTE — Patient Instructions (Signed)
Please complete lab work prior to leaving.   

## 2016-04-22 NOTE — Assessment & Plan Note (Signed)
Advised pt to d/c claritin D and switch to plain claritin due to risk of running up BP with the decongestant.

## 2016-10-23 ENCOUNTER — Encounter: Payer: Self-pay | Admitting: Family

## 2016-10-23 ENCOUNTER — Ambulatory Visit (INDEPENDENT_AMBULATORY_CARE_PROVIDER_SITE_OTHER): Payer: BLUE CROSS/BLUE SHIELD | Admitting: Family

## 2016-10-23 VITALS — BP 130/86 | HR 66 | Temp 98.8°F | Resp 16 | Ht 74.0 in | Wt 211.2 lb

## 2016-10-23 DIAGNOSIS — Z23 Encounter for immunization: Secondary | ICD-10-CM

## 2016-10-23 DIAGNOSIS — Z0000 Encounter for general adult medical examination without abnormal findings: Secondary | ICD-10-CM

## 2016-10-23 LAB — URINALYSIS, ROUTINE W REFLEX MICROSCOPIC
Bilirubin Urine: NEGATIVE
Hgb urine dipstick: NEGATIVE
KETONES UR: NEGATIVE
LEUKOCYTES UA: NEGATIVE
Nitrite: NEGATIVE
PH: 5.5 (ref 5.0–8.0)
RBC / HPF: NONE SEEN (ref 0–?)
Specific Gravity, Urine: 1.025 (ref 1.000–1.030)
TOTAL PROTEIN, URINE-UPE24: NEGATIVE
UROBILINOGEN UA: 0.2 (ref 0.0–1.0)
Urine Glucose: NEGATIVE

## 2016-10-23 LAB — CBC WITH DIFFERENTIAL/PLATELET
Basophils Absolute: 0 10*3/uL (ref 0.0–0.1)
Basophils Relative: 0.3 % (ref 0.0–3.0)
EOS ABS: 0.2 10*3/uL (ref 0.0–0.7)
Eosinophils Relative: 2.6 % (ref 0.0–5.0)
HCT: 49 % (ref 39.0–52.0)
HEMOGLOBIN: 16.5 g/dL (ref 13.0–17.0)
Lymphocytes Relative: 28.5 % (ref 12.0–46.0)
Lymphs Abs: 1.7 10*3/uL (ref 0.7–4.0)
MCHC: 33.7 g/dL (ref 30.0–36.0)
MCV: 91.2 fl (ref 78.0–100.0)
Monocytes Absolute: 0.5 10*3/uL (ref 0.1–1.0)
Monocytes Relative: 8.2 % (ref 3.0–12.0)
NEUTROS ABS: 3.6 10*3/uL (ref 1.4–7.7)
Neutrophils Relative %: 60.4 % (ref 43.0–77.0)
PLATELETS: 289 10*3/uL (ref 150.0–400.0)
RBC: 5.38 Mil/uL (ref 4.22–5.81)
RDW: 13.1 % (ref 11.5–15.5)
WBC: 6 10*3/uL (ref 4.0–10.5)

## 2016-10-23 LAB — LIPID PANEL
CHOL/HDL RATIO: 4
Cholesterol: 223 mg/dL — ABNORMAL HIGH (ref 0–200)
HDL: 53.9 mg/dL (ref >39.00)
LDL Cholesterol: 149 mg/dL — ABNORMAL HIGH (ref 0–99)
NONHDL: 168.93
Triglycerides: 98 mg/dL (ref 0.0–149.0)
VLDL: 19.6 mg/dL (ref 0.0–40.0)

## 2016-10-23 LAB — BASIC METABOLIC PANEL
BUN: 17 mg/dL (ref 6–23)
CALCIUM: 9.8 mg/dL (ref 8.4–10.5)
CO2: 31 mEq/L (ref 19–32)
CREATININE: 1.21 mg/dL (ref 0.40–1.50)
Chloride: 105 mEq/L (ref 96–112)
GFR: 83.38 mL/min (ref >60.00)
Glucose, Bld: 89 mg/dL (ref 70–99)
Potassium: 4 mEq/L (ref 3.5–5.1)
Sodium: 140 mEq/L (ref 135–145)

## 2016-10-23 LAB — HEPATIC FUNCTION PANEL
ALBUMIN: 4.4 g/dL (ref 3.5–5.2)
ALT: 24 U/L (ref 0–53)
AST: 22 U/L (ref 0–37)
Alkaline Phosphatase: 44 U/L (ref 39–117)
BILIRUBIN DIRECT: 0.1 mg/dL (ref 0.0–0.3)
TOTAL PROTEIN: 7.5 g/dL (ref 6.0–8.3)
Total Bilirubin: 0.8 mg/dL (ref 0.2–1.2)

## 2016-10-23 MED ORDER — RANITIDINE HCL 150 MG PO TABS: 150.0000 mg | ORAL_TABLET | Freq: Two times a day (BID) | ORAL | 1 refills | Status: DC | PRN

## 2016-10-23 NOTE — Progress Notes (Signed)
Pre visit review using our clinic review tool, if applicable. No additional management support is needed unless otherwise documented below in the visit note. 

## 2016-10-23 NOTE — Addendum Note (Signed)
Addended by: Kelle Darting A on: 10/23/2016 08:51 AM   Modules accepted: Orders

## 2016-10-23 NOTE — Progress Notes (Signed)
Subjective:    Patient ID: Darin Smith, male    DOB: 10-06-1971, 45 y.o.   MRN: [id number]  HPI  Patient presents today for complete physical.  Immunizations: flu shot today Diet: improved, cooks at home, drinking more water Exercise: does not exercise outside of work 13000-15000 steps a day  BP Readings from Last 3 Encounters:  10/23/16 130/86  04/22/16 130/86  09/27/15 130/89   Wt Readings from Last 3 Encounters:  10/23/16 211 lb 3.2 oz (95.8 kg)  04/22/16 215 lb 6.4 oz (97.7 kg)  09/27/15 217 lb (98.4 kg)     Review of Systems  Constitutional: Negative for unexpected weight change.  HENT: Negative for hearing loss and rhinorrhea.   Eyes: Negative for visual disturbance.  Respiratory: Negative for cough and shortness of breath.   Cardiovascular: Negative for chest pain and leg swelling.  Gastrointestinal: Negative for constipation and diarrhea.  Genitourinary: Negative for dysuria and frequency.  Musculoskeletal: Negative for arthralgias and myalgias.  Skin: Negative for rash.  Neurological: Negative for headaches.  Hematological: Negative for adenopathy.  Psychiatric/Behavioral:       Denies depression/anxiety   Past Medical History:  Diagnosis Date  . Rupture spleen age 89   hx of abdominal trauma     Social History   Social History  . Marital status: Married    Spouse name: N/A  . Number of children: N/A  . Years of education: N/A   Occupational History  . Not on file.   Social History Main Topics  . Smoking status: Current Every Day Smoker  . Smokeless tobacco: Never Used     Comment: 1 cigar a day  . Alcohol use 0.0 - 0.6 oz/week  . Drug use: No  . Sexual activity: Not on file   Other Topics Concern  . 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 They live in [place] 7/15                   Past Surgical  History:  Procedure Laterality Date  . BIOPSY PROSTATE  5/13   Dr. Lowella Bandy- negative  . SPLENECTOMY      Family History  Problem Relation Age of Onset  . Lupus Mother   . Vision loss Mother     in left eye  . Hypertension Father   . Obesity Sister   . Thyroid disease Sister   . Multiple sclerosis Sister   . Cancer Neg Hx     prostate    No Known Allergies  No current outpatient prescriptions on file prior to visit.   No current facility-administered medications on file prior to visit.     BP 130/86   Pulse 66   Temp 98.8 F (37.1 C) (Oral)   Resp 16   Ht 6\' 2"  (1.88 m)   Wt 211 lb 3.2 oz (95.8 kg)   SpO2 99% Comment: room air  BMI 27.12 kg/m       Objective:   Physical Exam Physical Exam  Constitutional: He is oriented to person, place, and time. He appears well-developed and well-nourished. No distress.  HENT:  Head: Normocephalic and atraumatic.  Right Ear: Tympanic membrane and ear canal normal.  Left Ear: Tympanic membrane and ear canal normal.  Mouth/Throat: Oropharynx is clear and moist.  Eyes: Pupils are equal,  round, and reactive to light. No scleral icterus.  Neck: Normal range of motion. No thyromegaly present.  Cardiovascular: Normal rate and regular rhythm.   No murmur heard. Pulmonary/Chest: Effort normal and breath sounds normal. No respiratory distress. He has no wheezes. He has no rales. He exhibits no tenderness.  Abdominal: Soft. Bowel sounds are normal. He exhibits no distension and no mass. There is no tenderness. There is no rebound and no guarding.  Musculoskeletal: He exhibits no edema.  Lymphadenopathy:    He has no cervical adenopathy.  Neurological: He is alert and oriented to person, place, and time. He has normal patellar reflexes. He exhibits normal muscle tone. Coordination normal.  Skin: Skin is warm and dry.  Psychiatric: He has a normal mood and affect. His behavior is normal. Judgment and thought content normal.           Assessment & Plan:   Preventative Care- we discussed continuing healthy diet. 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  Flu shot today as well.   Obtain routine lab work.   HTN- BP looks ok off of amlodipine. I have advised the patient to continue to monitor his BP regularly at home and to call if BP>140/90. Continue low sodium diet.        Assessment & Plan:  EKG tracing is personally reviewed.  EKG notes NSR.  No acute changes.

## 2016-10-23 NOTE — Patient Instructions (Signed)
Continue to work on healthy diet and limiting sodas. Complete blood work prior to leaving. Continue to monitor your blood pressure at home. Call me if BP readings >140/90. Work on quitting smoking.

## 2016-10-26 ENCOUNTER — Encounter: Payer: Self-pay | Admitting: Family

## 2016-10-26 LAB — TSH: TSH: 1.3 u[IU]/mL (ref 0.35–4.50)

## 2016-11-05 ENCOUNTER — Other Ambulatory Visit: Payer: Self-pay

## 2016-11-09 ENCOUNTER — Telehealth: Payer: Self-pay | Admitting: *Deleted

## 2016-11-09 MED ORDER — CEFUROXIME AXETIL 500 MG PO TABS: 500.0000 mg | ORAL_TABLET | Freq: Two times a day (BID) | ORAL | 0 refills | Status: DC

## 2016-11-09 NOTE — Telephone Encounter (Signed)
Refill sent. Please advise pt to schedule visit if symptoms worsen, if fever, or if symptoms fail to improve in the next 3 days.

## 2016-11-09 NOTE — Telephone Encounter (Signed)
[facility] cefuroxime axetil 500mg  . Take 1 tablet by mouth twice daily in case of illness / fever. 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 No fever, sore throat and states he feels ok at present.  Please advise Rx?

## 2016-11-10 NOTE — Telephone Encounter (Signed)
Notified pt and he voices understanding. 

## 2017-01-06 ENCOUNTER — Encounter: Payer: Self-pay | Admitting: Family

## 2017-01-06 DIAGNOSIS — R0681 Apnea, not elsewhere classified: Secondary | ICD-10-CM

## 2017-01-26 ENCOUNTER — Encounter: Payer: Self-pay | Admitting: Family

## 2017-01-26 ENCOUNTER — Ambulatory Visit (INDEPENDENT_AMBULATORY_CARE_PROVIDER_SITE_OTHER): Payer: BLUE CROSS/BLUE SHIELD | Admitting: Family

## 2017-01-26 VITALS — BP 144/94 | HR 73 | Temp 98.6°F | Resp 16 | Ht 74.0 in | Wt 208.4 lb

## 2017-01-26 DIAGNOSIS — I10 Essential (primary) hypertension: Secondary | ICD-10-CM | POA: Diagnosis not present

## 2017-01-26 DIAGNOSIS — Z9081 Acquired absence of spleen: Secondary | ICD-10-CM

## 2017-01-26 DIAGNOSIS — K219 Gastro-esophageal reflux disease without esophagitis: Secondary | ICD-10-CM | POA: Diagnosis not present

## 2017-01-26 MED ORDER — AMLODIPINE BESYLATE 5 MG PO TABS: 5.0000 mg | ORAL_TABLET | Freq: Every day | ORAL | 3 refills | Status: DC

## 2017-01-26 MED ORDER — CEFUROXIME AXETIL 500 MG PO TABS: 500.0000 mg | ORAL_TABLET | Freq: Two times a day (BID) | ORAL | 0 refills | Status: DC

## 2017-01-26 NOTE — Progress Notes (Signed)
Pre visit review using our clinic review tool, if applicable. No additional management support is needed unless otherwise documented below in the visit note. 

## 2017-01-26 NOTE — Patient Instructions (Signed)
Restart amlodipine. Keep up the good work with healthier diet!

## 2017-01-26 NOTE — Assessment & Plan Note (Signed)
Refill provided for ceftin.

## 2017-01-26 NOTE — Assessment & Plan Note (Signed)
Stable with dietary changes and rare use of prn zantac.

## 2017-01-26 NOTE — Assessment & Plan Note (Signed)
BP up, restart amlodipine. Follow up in 1 month for a nurse visit BP check.

## 2017-01-26 NOTE — Progress Notes (Signed)
Subjective:    Patient ID: Darin Smith, male    DOB: [birth year], 46 y.o.   MRN: [id number]  HPI   [name] is a 46 yr old male who presents today for follow up of his hypertension. Last visit he had been off of his amlodipine and was following his blood pressures.    BP Readings from Last 3 Encounters:  01/26/17 (!) 144/94  10/23/16 130/86  04/22/16 130/86   GERD- stable, has not needed zantac much recently since he cut back on spicy foods and fast foods.  He has also lost some weight.    Wt Readings from Last 3 Encounters:  01/26/17 208 lb 6.4 oz (94.5 kg)  10/23/16 211 lb 3.2 oz (95.8 kg)  04/22/16 215 lb 6.4 oz (97.7 kg)   S/p splenectomy- requests refill on his ceftin.    Review of Systems See HPI  Past Medical History:  Diagnosis Date  . Rupture spleen age 41   hx of abdominal trauma     Social History   Social History  . Marital status: Married    Spouse name: N/A  . Number of children: N/A  . Years of education: N/A   Occupational History  . Not on file.   Social History Main Topics  . Smoking status: Current Every Day Smoker  . Smokeless tobacco: Never Used     Comment: 1 cigar a day  . Alcohol use 0.0 - 0.6 oz/week  . Drug use: No  . Sexual activity: Not on file   Other Topics Concern  . 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 They live in [place] 7/15                   Past Surgical History:  Procedure Laterality Date  . BIOPSY PROSTATE  5/13   Dr. Lowella Bandy- negative  . SPLENECTOMY      Family History  Problem Relation Age of Onset  . Lupus Mother   . Vision loss Mother     in left eye  . Hypertension Father   . Obesity Sister   . Thyroid disease Sister   . Multiple sclerosis Sister   . Cancer Neg Hx     prostate    No Known Allergies  Current Outpatient Prescriptions on File Prior to  Visit  Medication Sig Dispense Refill  . ranitidine (ZANTAC) 150 MG tablet Take 1 tablet (150 mg total) by mouth 2 (two) times daily as needed for heartburn. 180 tablet 1   No current facility-administered medications on file prior to visit.     BP (!) 144/94 (BP Location: Left Arm, Cuff Size: Large)   Pulse 73   Temp 98.6 F (37 C) (Oral)   Resp 16   Ht 6\' 2"  (1.88 m)   Wt 208 lb 6.4 oz (94.5 kg)   SpO2 100% Comment: room air  BMI 26.76 kg/m       Objective:   Physical Exam  Constitutional: He is oriented to person, place, and time. He appears well-developed and well-nourished. No distress.  HENT:  Head: Normocephalic and atraumatic.  Cardiovascular: Normal rate and regular rhythm.   No murmur heard. Pulmonary/Chest: Effort normal and breath sounds normal. No respiratory distress. He has no wheezes. He has no rales.  Musculoskeletal: He exhibits no edema.  Neurological: He is alert and oriented to person, place, and time.  Skin: Skin is warm and dry.  Psychiatric: He has a normal mood and affect. His behavior is normal. Thought content normal.          Assessment & Plan:

## 2017-02-22 ENCOUNTER — Ambulatory Visit (INDEPENDENT_AMBULATORY_CARE_PROVIDER_SITE_OTHER): Payer: BLUE CROSS/BLUE SHIELD | Admitting: Family

## 2017-02-22 ENCOUNTER — Encounter: Payer: Self-pay | Admitting: Family

## 2017-02-22 DIAGNOSIS — I10 Essential (primary) hypertension: Secondary | ICD-10-CM | POA: Diagnosis not present

## 2017-02-22 NOTE — Assessment & Plan Note (Signed)
BP stable/improved. Continue current dose of amlodipine.

## 2017-02-22 NOTE — Patient Instructions (Signed)
-   Continue amlodipine ?

## 2017-02-22 NOTE — Progress Notes (Signed)
Subjective:    Patient ID: TAKUYA TOPF, male    DOB: 1971/09/27, 46 y.o.   MRN: [id number]  HPI  Mr. Fenerty is a 46 yr old male who presents today for follow up of his hypertension.  Last visit his BP was noted to be elevated off of amlodipine and his amlodipine was restarted.   BP Readings from Last 3 Encounters:  02/22/17 134/89  01/26/17 (!) 144/94  10/23/16 130/86     Review of Systems See HPI  Past Medical History:  Diagnosis Date  . Rupture spleen age 6   hx of abdominal trauma     Social History   Social History  . Marital status: Married    Spouse name: N/A  . Number of children: N/A  . Years of education: N/A   Occupational History  . Not on file.   Social History Main Topics  . Smoking status: Current Every Day Smoker  . Smokeless tobacco: Never Used     Comment: 1 cigar a day  . Alcohol use 0.0 - 0.6 oz/week  . Drug use: No  . Sexual activity: Not on file   Other Topics Concern  . 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 They live in [place] 7/15                   Past Surgical History:  Procedure Laterality Date  . BIOPSY PROSTATE  5/13   Dr. Lowella Bandy- negative  . SPLENECTOMY      Family History  Problem Relation Age of Onset  . Lupus Mother   . Vision loss Mother     in left eye  . Hypertension Father   . Obesity Sister   . Thyroid disease Sister   . Multiple sclerosis Sister   . Cancer Neg Hx     prostate    No Known Allergies  Current Outpatient Prescriptions on File Prior to Visit  Medication Sig Dispense Refill  . amLODipine (NORVASC) 5 MG tablet Take 1 tablet (5 mg total) by mouth daily. 90 tablet 3  . cefUROXime (CEFTIN) 500 MG tablet Take 1 tablet (500 mg total) by mouth 2 (two) times daily with a meal. (Patient not taking: Reported on 02/22/2017) 20 tablet 0   No current  facility-administered medications on file prior to visit.     BP 134/89 (BP Location: Right Arm, Cuff Size: Normal)   Pulse 82   Temp 98.6 F (37 C) (Oral)   Resp (!) 71   Ht 6\' 2"  (1.88 m)   Wt 210 lb (95.3 kg)   SpO2 100% Comment: room air  BMI 26.96 kg/m       Objective:   Physical Exam  Constitutional: He is oriented to person, place, and time. He appears well-developed and well-nourished. No distress.  HENT:  Head: Normocephalic and atraumatic.  Cardiovascular: Normal rate and regular rhythm.   No murmur heard. Pulmonary/Chest: Effort normal and breath sounds normal. No respiratory distress. He has no wheezes. He has no rales.  Musculoskeletal: He exhibits no edema.  Neurological: He is alert and oriented to person, place, and time.  Skin: Skin is warm and dry.  Psychiatric: He has a normal mood and affect. His behavior is normal. Thought content normal.          Assessment &  Plan:

## 2017-02-22 NOTE — Progress Notes (Signed)
Pre visit review using our clinic review tool, if applicable. No additional management support is needed unless otherwise documented below in the visit note. 

## 2017-02-23 ENCOUNTER — Ambulatory Visit (HOSPITAL_BASED_OUTPATIENT_CLINIC_OR_DEPARTMENT_OTHER): Payer: BLUE CROSS/BLUE SHIELD | Attending: Family | Admitting: Pulmonary Disease

## 2017-02-23 DIAGNOSIS — R0681 Apnea, not elsewhere classified: Secondary | ICD-10-CM | POA: Diagnosis present

## 2017-02-23 DIAGNOSIS — G478 Other sleep disorders: Secondary | ICD-10-CM | POA: Diagnosis not present

## 2017-03-03 DIAGNOSIS — G4739 Other sleep apnea: Secondary | ICD-10-CM | POA: Diagnosis not present

## 2017-03-03 NOTE — Procedures (Signed)
Patient Name: Darin Smith, Darin Smith Date: 02/23/2017 Gender: Male D.O.B: 12/30/1970 Age (years): 45 Referring Provider: Earlie Counts Height (inches): 74 Interpreting Physician: Kara Mead MD, ABSM Weight (lbs): 207 RPSGT: Laren Everts BMI: 27 MRN: [id number] Neck Size: 16.50   CLINICAL INFORMATION Sleep Study Type: NPSG  Indication for sleep study: Fatigue, Hypertension, Snoring, Witnessed Apneas  Epworth Sleepiness Score: 1     SLEEP STUDY TECHNIQUE As per the AASM Manual for the Scoring of Sleep and Associated Events v2.3 (April 2016) with a hypopnea requiring 4% desaturations.  The channels recorded and monitored were frontal, central and occipital EEG, electrooculogram (EOG), submentalis EMG (chin), nasal and oral airflow, thoracic and abdominal wall motion, anterior tibialis EMG, snore microphone, electrocardiogram, and pulse oximetry.  SLEEP ARCHITECTURE The study was initiated at 10:03:48 PM and ended at 4:57:04 AM.  Sleep onset time was 21.3 minutes and the sleep efficiency was 89.8%. The total sleep time was 371.0 minutes.  Stage REM latency was 92.0 minutes.  The patient spent 8.36% of the night in stage N1 sleep, 72.24% in stage N2 sleep, 0.00% in stage N3 and 19.41% in REM.  Alpha intrusion was absent.  Supine sleep was 22.91%.  RESPIRATORY PARAMETERS The overall apnea/hypopnea index (AHI) was 2.7 per hour. There were 8 total apneas, including 8 obstructive, 0 central and 0 mixed apneas. There were 9 hypopneas and 38 RERAs.  The AHI during Stage REM sleep was 4.2 per hour.  AHI while supine was 9.9 per hour.  The mean oxygen saturation was 96.56%. The minimum SpO2 during sleep was 92.00%.  Moderate snoring was noted during this study.  CARDIAC DATA The 2 lead EKG demonstrated sinus rhythm. The mean heart rate was 58.90 beats per minute. Other EKG findings include: None.   LEG MOVEMENT DATA The total PLMS were 0 with a resulting PLMS index of  0.00. Associated arousal with leg movement index was 0.0 .  IMPRESSIONS - No significant obstructive sleep apnea occurred during this study (AHI = 2.7/h). - No significant central sleep apnea occurred during this study (CAI = 0.0/h). - The patient had minimal or no oxygen desaturation during the study (Min O2 = 92.00%) - The patient snored with Moderate snoring volume. - No cardiac abnormalities were noted during this study. - Clinically significant periodic limb movements did not occur during sleep. No significant associated arousals.   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 - Sleep hygiene should be reviewed to assess factors that may improve sleep quality. - Weight management and regular exercise should be initiated or continued if appropriate. - Positional therapy, avoid supine position if possible   Kara Mead MD Board Certified in [place]

## 2017-03-04 ENCOUNTER — Encounter: Payer: Self-pay | Admitting: Family

## 2017-05-12 ENCOUNTER — Encounter: Payer: Self-pay | Admitting: Family

## 2017-06-22 ENCOUNTER — Other Ambulatory Visit: Payer: Self-pay | Admitting: Family

## 2017-08-16 ENCOUNTER — Encounter: Payer: Self-pay | Admitting: Family

## 2017-08-16 ENCOUNTER — Ambulatory Visit (INDEPENDENT_AMBULATORY_CARE_PROVIDER_SITE_OTHER): Payer: BLUE CROSS/BLUE SHIELD | Admitting: Family

## 2017-08-16 VITALS — BP 132/96 | HR 67 | Temp 98.2°F | Resp 16 | Ht 74.0 in | Wt 209.6 lb

## 2017-08-16 DIAGNOSIS — I10 Essential (primary) hypertension: Secondary | ICD-10-CM

## 2017-08-16 DIAGNOSIS — Q8901 Asplenia (congenital): Secondary | ICD-10-CM

## 2017-08-16 DIAGNOSIS — K219 Gastro-esophageal reflux disease without esophagitis: Secondary | ICD-10-CM

## 2017-08-16 MED ORDER — AMLODIPINE BESYLATE 10 MG PO TABS: 10.0000 mg | ORAL_TABLET | Freq: Every day | ORAL | 3 refills | Status: DC

## 2017-08-16 MED ORDER — RANITIDINE HCL 150 MG PO CAPS: 150.0000 mg | ORAL_CAPSULE | Freq: Two times a day (BID) | ORAL | Status: DC

## 2017-08-16 NOTE — Patient Instructions (Signed)
Please increase amlodipine form 5mg to 10mg.  

## 2017-08-16 NOTE — Progress Notes (Signed)
Subjective:    Patient ID: Darin Smith, male    DOB: November 27, 1971, 46 y.o.   MRN: [id number]  HPI  Darin Smith is a 46 yr old male who presents today for follow up.  1) GERD- using otc ranitidine. Reports that certain foods such as onions and pizza really worsen his gerd.   2) HTN- maintained on amlodipine 5mg .  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   Review of Systems  Respiratory: Negative for shortness of breath.   Cardiovascular: Negative for chest pain and leg swelling.   See HPI  Past Medical History:  Diagnosis Date  . Rupture spleen age 23   hx of abdominal trauma     Social History   Social History  . Marital status: Married    Spouse name: N/A  . Number of children: N/A  . Years of education: N/A   Occupational History  . Not on file.   Social History Main Topics  . Smoking status: Current Every Day Smoker  . Smokeless tobacco: Never Used     Comment: 1 cigar a day  . Alcohol use 0.0 - 0.6 oz/week  . Drug use: No  . Sexual activity: Not on file   Other Topics Concern  . 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 They live in [place] 7/15                   Past Surgical History:  Procedure Laterality Date  . BIOPSY PROSTATE  5/13   Dr. Lowella Bandy- negative  . SPLENECTOMY      Family History  Problem Relation Age of Onset  . Lupus Mother   . Vision loss Mother        in left eye  . Hypertension Father   . Obesity Sister   . Thyroid disease Sister   . Multiple sclerosis Sister   . Cancer Neg Hx        prostate    No Known Allergies  Current Outpatient Prescriptions on File Prior to Visit  Medication Sig Dispense Refill  . amLODipine (NORVASC) 5 MG tablet TAKE 1 TABLET BY MOUTH DAILY 90  tablet 1  . cefUROXime (CEFTIN) 500 MG tablet Take 1 tablet (500 mg total) by mouth 2 (two) times daily with a meal. 20 tablet 0   No current facility-administered medications on file prior to visit.     BP (!) 132/96 (BP Location: Right Arm, Cuff Size: Normal)   Pulse 67   Temp 98.2 F (36.8 C) (Oral)   Resp 16   Ht 6\' 2"  (1.88 m)   Wt 209 lb 9.6 oz (95.1 kg)   SpO2 100%   BMI 26.91 kg/m       Objective:   Physical Exam  Constitutional: He is oriented to person, place, and time. He appears well-developed and well-nourished. No distress.  HENT:  Head: Normocephalic and atraumatic.  Cardiovascular: Normal rate and regular rhythm.   No murmur heard. Pulmonary/Chest: Effort normal and breath sounds normal. No respiratory distress. He has no wheezes. He has no rales.  Musculoskeletal: He exhibits no edema.  Neurological: He is alert and oriented to person, place, and time.  Skin:  Skin is warm and dry.  Psychiatric: He has a normal mood and affect. His behavior is normal. Thought content normal.          Assessment & Plan:  gerd- stable with ranitidine. Continue same.  HTN- uncontrolled. DBP is elevated. Will increase amlodipine from 5mg  to 10mg .   Asplenia- immunizations reviewed and up to date.

## 2017-09-03 ENCOUNTER — Ambulatory Visit (INDEPENDENT_AMBULATORY_CARE_PROVIDER_SITE_OTHER): Payer: BLUE CROSS/BLUE SHIELD | Admitting: Family

## 2017-09-03 ENCOUNTER — Encounter: Payer: Self-pay | Admitting: Family

## 2017-09-03 VITALS — BP 122/82 | HR 64 | Temp 98.4°F | Resp 16 | Ht 74.0 in | Wt 208.2 lb

## 2017-09-03 DIAGNOSIS — I10 Essential (primary) hypertension: Secondary | ICD-10-CM

## 2017-09-03 NOTE — Patient Instructions (Signed)
Please continue current dose of amlodipine.  Follow up as scheduled for your physical.

## 2017-09-03 NOTE — Progress Notes (Signed)
Subjective:    Patient ID: Darin Smith, male    DOB: 1971/07/14, 46 y.o.   MRN: [id number]  HPI  Darin Smith is a 46 yr old male who presents today for follow up of his blood pressure. Last visit we increased his amlodipine from 5mg  to 10mg . He reports tolerating this dose without difficulty. Denies LE edema on this dose.   BP Readings from Last 3 Encounters:  09/03/17 122/82  08/16/17 (!) 132/96  02/22/17 134/89       Review of Systems See HPI  Past Medical History:  Diagnosis Date  . Rupture spleen age 23   hx of abdominal trauma     Social History   Social History  . Marital status: Married    Spouse name: N/A  . Number of children: N/A  . Years of education: N/A   Occupational History  . Not on file.   Social History Main Topics  . Smoking status: Current Every Day Smoker  . Smokeless tobacco: Never Used     Comment: 1 cigar a day  . Alcohol use 0.0 - 0.6 oz/week  . Drug use: No  . Sexual activity: Not on file   Other Topics Concern  . 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 They live in [place] 7/15                   Past Surgical History:  Procedure Laterality Date  . BIOPSY PROSTATE  5/13   Dr. Lowella Bandy- negative  . SPLENECTOMY      Family History  Problem Relation Age of Onset  . Lupus Mother   . Vision loss Mother        in left eye  . Hypertension Father   . Obesity Sister   . Thyroid disease Sister   . Multiple sclerosis Sister   . Cancer Neg Hx        prostate    No Known Allergies  Current Outpatient Prescriptions on File Prior to Visit  Medication Sig Dispense Refill  . amLODipine (NORVASC) 10 MG tablet Take 1 tablet (10 mg total) by mouth daily. 30 tablet 3  . ranitidine (ZANTAC) 150 MG capsule Take 1 capsule (150 mg total) by mouth 2 (two) times daily.    . cefUROXime (CEFTIN) 500 MG  tablet Take 1 tablet (500 mg total) by mouth 2 (two) times daily with a meal. (Patient not taking: Reported on 09/03/2017) 20 tablet 0   No current facility-administered medications on file prior to visit.     BP 122/82 (BP Location: Right Arm, Cuff Size: Normal)   Pulse 64   Temp 98.4 F (36.9 C) (Oral)   Resp 16   Ht 6\' 2"  (1.88 m)   Wt 208 lb 3.2 oz (94.4 kg)   SpO2 100%   BMI 26.73 kg/m       Objective:   Physical Exam  Constitutional: He is oriented to person, place, and time. He appears well-developed and well-nourished. No distress.  HENT:  Head: Normocephalic and atraumatic.  Cardiovascular: Normal rate and regular rhythm.   No murmur heard. Pulmonary/Chest: Effort normal and breath sounds normal. No respiratory distress. He has no wheezes. He has no rales.  Musculoskeletal: He exhibits no edema.  Neurological: He is alert and oriented to person, place, and  time.  Skin: Skin is warm and dry.  Psychiatric: He has a normal mood and affect. His behavior is normal. Thought content normal.          Assessment & Plan:  HTN- BP is improved on current dose of amlodipine. Continue same. He would like to get his flu shot in October when he comes for his physical.

## 2017-10-18 ENCOUNTER — Other Ambulatory Visit: Payer: Self-pay | Admitting: Family

## 2017-10-18 NOTE — Telephone Encounter (Signed)
Rx approved for the Ranitidine 150mg  and denied for the Cefuroxime.  Pt will need an office visit.

## 2017-10-19 ENCOUNTER — Other Ambulatory Visit: Payer: Self-pay | Admitting: Family

## 2017-10-24 MED ORDER — CEFUROXIME AXETIL 500 MG PO TABS: 500.0000 mg | ORAL_TABLET | Freq: Two times a day (BID) | ORAL | 0 refills | Status: DC

## 2017-10-24 NOTE — Addendum Note (Signed)
Addended by: Debbrah Alar on: 10/24/2017 07:55 AM   Modules accepted: Orders

## 2017-10-27 ENCOUNTER — Ambulatory Visit (INDEPENDENT_AMBULATORY_CARE_PROVIDER_SITE_OTHER): Payer: BLUE CROSS/BLUE SHIELD | Admitting: Family

## 2017-10-27 ENCOUNTER — Telehealth: Payer: Self-pay | Admitting: Family

## 2017-10-27 ENCOUNTER — Encounter: Payer: Self-pay | Admitting: Family

## 2017-10-27 VITALS — BP 134/82 | HR 65 | Temp 98.2°F | Resp 16 | Ht 73.0 in | Wt 213.0 lb

## 2017-10-27 DIAGNOSIS — Z0000 Encounter for general adult medical examination without abnormal findings: Secondary | ICD-10-CM

## 2017-10-27 DIAGNOSIS — Z23 Encounter for immunization: Secondary | ICD-10-CM | POA: Diagnosis not present

## 2017-10-27 LAB — CBC WITH DIFFERENTIAL/PLATELET
BASOS ABS: 0 10*3/uL (ref 0.0–0.1)
Basophils Relative: 0.4 % (ref 0.0–3.0)
EOS ABS: 0.2 10*3/uL (ref 0.0–0.7)
EOS PCT: 2.9 % (ref 0.0–5.0)
HCT: 46.7 % (ref 39.0–52.0)
HEMOGLOBIN: 15.4 g/dL (ref 13.0–17.0)
LYMPHS PCT: 32.4 % (ref 12.0–46.0)
Lymphs Abs: 1.7 10*3/uL (ref 0.7–4.0)
MCHC: 32.9 g/dL (ref 30.0–36.0)
MCV: 94.1 fl (ref 78.0–100.0)
MONO ABS: 0.4 10*3/uL (ref 0.1–1.0)
Monocytes Relative: 8.4 % (ref 3.0–12.0)
Neutro Abs: 2.9 10*3/uL (ref 1.4–7.7)
Neutrophils Relative %: 55.9 % (ref 43.0–77.0)
Platelets: 297 10*3/uL (ref 150.0–400.0)
RBC: 4.96 Mil/uL (ref 4.22–5.81)
RDW: 13.2 % (ref 11.5–15.5)
WBC: 5.2 10*3/uL (ref 4.0–10.5)

## 2017-10-27 LAB — URINALYSIS, ROUTINE W REFLEX MICROSCOPIC
BILIRUBIN URINE: NEGATIVE
HGB URINE DIPSTICK: NEGATIVE
KETONES UR: NEGATIVE
Leukocytes, UA: NEGATIVE
Nitrite: NEGATIVE
RBC / HPF: NONE SEEN (ref 0–?)
Total Protein, Urine: NEGATIVE
Urine Glucose: NEGATIVE
Urobilinogen, UA: 0.2 (ref 0.0–1.0)
pH: 5.5 (ref 5.0–8.0)

## 2017-10-27 LAB — BASIC METABOLIC PANEL
BUN: 17 mg/dL (ref 6–23)
CALCIUM: 9.7 mg/dL (ref 8.4–10.5)
CHLORIDE: 105 meq/L (ref 96–112)
CO2: 31 mEq/L (ref 19–32)
CREATININE: 1.19 mg/dL (ref 0.40–1.50)
GFR: 84.62 mL/min (ref >60.00)
Glucose, Bld: 94 mg/dL (ref 70–99)
Potassium: 4.4 mEq/L (ref 3.5–5.1)
Sodium: 140 mEq/L (ref 135–145)

## 2017-10-27 LAB — LIPID PANEL
CHOL/HDL RATIO: 4
Cholesterol: 205 mg/dL — ABNORMAL HIGH (ref 0–200)
HDL: 49.6 mg/dL (ref >39.00)
LDL CALC: 138 mg/dL — AB (ref 0–99)
NonHDL: 155.63
Triglycerides: 87 mg/dL (ref 0.0–149.0)
VLDL: 17.4 mg/dL (ref 0.0–40.0)

## 2017-10-27 LAB — HEPATIC FUNCTION PANEL
ALK PHOS: 42 U/L (ref 39–117)
ALT: 44 U/L (ref 0–53)
AST: 35 U/L (ref 0–37)
Albumin: 4.3 g/dL (ref 3.5–5.2)
BILIRUBIN DIRECT: 0.1 mg/dL (ref 0.0–0.3)
BILIRUBIN TOTAL: 0.7 mg/dL (ref 0.2–1.2)
Total Protein: 7.4 g/dL (ref 6.0–8.3)

## 2017-10-27 LAB — TSH: TSH: 1.62 u[IU]/mL (ref 0.35–4.50)

## 2017-10-27 NOTE — Telephone Encounter (Signed)
Records received and placed in PCP's yellow folder for review. 

## 2017-10-27 NOTE — Telephone Encounter (Signed)
Could you please request records from [facility]?  We referred him in 2016 but I do not see that they are sending [place] his records.

## 2017-10-27 NOTE — Patient Instructions (Addendum)
Please complete lab work prior to leaving.  Try to add 30 minutes of cardio 5 days a week. Schedule a routine eye exam at your convenience.

## 2017-10-27 NOTE — Progress Notes (Signed)
Subjective:    Patient ID: Darin Smith, male    DOB: Mar 17, 1971, 46 y.o.   MRN: [id number]  HPI  Darin Smith is a 46 yr old male with asplenia who presents today for complete physical.   Patient presents today for complete physical.  Immunizations: tetanus, meningococcal, pneumovax, prevnar, up to date.  Diet: good  Wt Readings from Last 3 Encounters:  10/27/17 213 lb (96.6 kg)  09/03/17 208 lb 3.2 oz (94.4 kg)  08/16/17 209 lb 9.6 oz (95.1 kg)  Exercise: 15-20K  Vision: due Dental: up to date Seeing urology  Review of Systems  Constitutional: Negative for unexpected weight change.  HENT: Negative for hearing loss and rhinorrhea.   Eyes: Negative for visual disturbance.  Respiratory: Negative for cough.   Gastrointestinal: Negative for constipation and diarrhea.  Genitourinary: Negative for dysuria and frequency.  Musculoskeletal: Negative for arthralgias and myalgias.  Skin: Negative for rash.  Neurological: Negative for headaches.  Hematological: Negative for adenopathy.  Psychiatric/Behavioral:       Denies depression/anxiety       Past Medical History:  Diagnosis Date  . Rupture spleen age 31   hx of abdominal trauma     Social History   Social History  . Marital status: Married    Spouse name: N/A  . Number of children: N/A  . Years of education: N/A   Occupational History  . Not on file.   Social History Main Topics  . Smoking status: Current Every Day Smoker  . Smokeless tobacco: Never Used     Comment: 1 cigar a day  . Alcohol use 0.0 - 0.6 oz/week  . Drug use: No  . Sexual activity: Not on file   Other Topics Concern  . 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 They live in [place] 7/15                   Past Surgical History:  Procedure Laterality Date  . BIOPSY PROSTATE  5/13   Dr. Lowella Smith- negative   . SPLENECTOMY      Family History  Problem Relation Age of Onset  . Lupus Mother   . Vision loss Mother        in left eye  . Hypertension Father   . Obesity Sister   . Thyroid disease Sister   . Multiple sclerosis Sister   . Cancer Neg Hx        prostate    No Known Allergies  Current Outpatient Prescriptions on File Prior to Visit  Medication Sig Dispense Refill  . amLODipine (NORVASC) 10 MG tablet Take 1 tablet (10 mg total) by mouth daily. 30 tablet 3  . cefUROXime (CEFTIN) 500 MG tablet Take 1 tablet (500 mg total) by mouth 2 (two) times daily with a meal. (Patient taking differently: Take 500 mg by mouth 2 (two) times daily with a meal. To have on hand b/c of history of splenectomy) 20 tablet 0  . ranitidine (ZANTAC) 150 MG capsule Take 1 capsule (150 mg total) by mouth 2 (two) times daily.     No current facility-administered medications on file prior to visit.     BP 134/82 (BP Location: Right Arm, Cuff Size: Large)   Pulse 65   Temp 98.2 F (36.8 C) (Oral)   Resp  16   Ht 6\' 1"  (1.854 m)   Wt 213 lb (96.6 kg)   SpO2 98%   BMI 28.10 kg/m    Objective:   Physical Exam  Physical Exam  Constitutional: He is oriented to person, place, and time. He appears well-developed and well-nourished. No distress.  HENT:  Head: Normocephalic and atraumatic.  Right Ear: Tympanic membrane and ear canal normal.  Left Ear: Tympanic membrane and ear canal normal.  Mouth/Throat: Oropharynx is clear and moist.  Eyes: Pupils are equal, round, and reactive to light. No scleral icterus.  Neck: Normal range of motion. No thyromegaly present.  Cardiovascular: Normal rate and regular rhythm.   No murmur heard. Pulmonary/Chest: Effort normal and breath sounds normal. No respiratory distress. He has no wheezes. He has no rales. He exhibits no tenderness.  Abdominal: Soft. Bowel sounds are normal. He exhibits no distension and no mass. There is no tenderness. There is no rebound and no  guarding.  Musculoskeletal: He exhibits no edema.  Lymphadenopathy:    He has no cervical adenopathy.  Neurological: He is alert and oriented to person, place, and time. He has normal patellar reflexes. He exhibits normal muscle tone. Coordination normal.  Skin: Skin is warm and dry.  Psychiatric: He has a normal mood and affect. His behavior is normal. Judgment and thought content normal.           Assessment & Plan:   Preventative care- discussed healthy diet, routine exercise and weight los. Goal weight about 190 lbs.  Obtain routine lab work. He is following with urology for elevated PSA- will request records. Flu shot today.       Assessment & Plan:  EKG tracing is personally reviewed.  EKG notes NSR.  No acute changes.

## 2017-10-27 NOTE — Telephone Encounter (Signed)
Spoke with [facility] at [facility] [phone]) and requested records for the last year. She states she will print those out and fax them to our main fax. Awaiting records.

## 2017-10-29 NOTE — Telephone Encounter (Signed)
Records received

## 2017-11-29 ENCOUNTER — Encounter: Payer: Self-pay | Admitting: Family

## 2017-12-10 ENCOUNTER — Encounter: Payer: Self-pay | Admitting: Family

## 2017-12-10 MED ORDER — AMLODIPINE BESYLATE 10 MG PO TABS: 10.0000 mg | ORAL_TABLET | Freq: Every day | ORAL | 1 refills | Status: DC

## 2018-03-02 ENCOUNTER — Other Ambulatory Visit: Payer: Self-pay | Admitting: Family

## 2018-03-02 MED ORDER — RANITIDINE HCL 150 MG PO CAPS: 150.0000 mg | ORAL_CAPSULE | Freq: Two times a day (BID) | ORAL | 1 refills | Status: DC

## 2018-03-08 ENCOUNTER — Telehealth: Payer: Self-pay | Admitting: Family

## 2018-03-08 NOTE — Telephone Encounter (Signed)
Copied from [place] [id number]. Topic: Quick Communication - Rx Refill/Question >> Mar 08, 2018  6:48 PM Waylan Rocher, [place] L wrote: Medication: zantac Has the patient contacted their pharmacy? Yes.   (Agent: If no, request that the patient contact the pharmacy for the refill.) Preferred Pharmacy (with phone number or street name): [id number] [address], [place] [address] [place] [address] Phone: [phone] Fax: [phone] Agent: Please be advised that RX refills may take up to 3 business days. We ask that you follow-up with your pharmacy. Patient says the [facility] not at the [facility] [place] although [facility] says it was sent on 03/02/2018. He just confirmed that 5 minutes ago.

## 2018-03-10 ENCOUNTER — Other Ambulatory Visit: Payer: Self-pay | Admitting: *Deleted

## 2018-03-10 MED ORDER — RANITIDINE HCL 150 MG PO CAPS: 150.0000 mg | ORAL_CAPSULE | Freq: Two times a day (BID) | ORAL | 0 refills | Status: DC

## 2018-03-10 MED ORDER — RANITIDINE HCL 150 MG PO CAPS: 150.0000 mg | ORAL_CAPSULE | Freq: Two times a day (BID) | ORAL | 1 refills | Status: DC

## 2018-03-10 NOTE — Telephone Encounter (Signed)
rx resent.  It was set as OTC.

## 2018-03-15 NOTE — Telephone Encounter (Signed)
Pt called [facility] on 03/12/18 because [facility]. He discovered [facility] had been sent to [facility] and [facility]. He did have Rx transferred but wanted this recorded in his record. Nothing else needed at this time.

## 2018-04-27 ENCOUNTER — Ambulatory Visit: Payer: BLUE CROSS/BLUE SHIELD | Admitting: Family

## 2018-04-27 ENCOUNTER — Encounter: Payer: Self-pay | Admitting: Family

## 2018-04-27 VITALS — BP 128/85 | HR 73 | Temp 98.4°F | Resp 16 | Ht 73.0 in | Wt 210.4 lb

## 2018-04-27 DIAGNOSIS — I10 Essential (primary) hypertension: Secondary | ICD-10-CM | POA: Diagnosis not present

## 2018-04-27 DIAGNOSIS — Z9081 Acquired absence of spleen: Secondary | ICD-10-CM | POA: Diagnosis not present

## 2018-04-27 DIAGNOSIS — K219 Gastro-esophageal reflux disease without esophagitis: Secondary | ICD-10-CM

## 2018-04-27 LAB — BASIC METABOLIC PANEL
BUN: 17 mg/dL (ref 6–23)
CO2: 28 mEq/L (ref 19–32)
CREATININE: 1.14 mg/dL (ref 0.40–1.50)
Calcium: 9.6 mg/dL (ref 8.4–10.5)
Chloride: 106 mEq/L (ref 96–112)
GFR: 88.73 mL/min (ref >60.00)
Glucose, Bld: 97 mg/dL (ref 70–99)
POTASSIUM: 3.8 meq/L (ref 3.5–5.1)
Sodium: 142 mEq/L (ref 135–145)

## 2018-04-27 MED ORDER — RANITIDINE HCL 150 MG PO CAPS: 150.0000 mg | ORAL_CAPSULE | Freq: Two times a day (BID) | ORAL | 1 refills | Status: DC

## 2018-04-27 MED ORDER — AMLODIPINE BESYLATE 10 MG PO TABS: 10.0000 mg | ORAL_TABLET | Freq: Every day | ORAL | 1 refills | Status: DC

## 2018-04-27 MED ORDER — CEFUROXIME AXETIL 500 MG PO TABS: 500.0000 mg | ORAL_TABLET | Freq: Two times a day (BID) | ORAL | 0 refills | Status: DC

## 2018-04-27 NOTE — Progress Notes (Signed)
Subjective:    Patient ID: Darin Smith, male    DOB: January 21, 1971, 47 y.o.   MRN: [id number]  HPI  Patient is a 47 yr old male who presents today for follow up.  HTN- he is maintained on amlodipine.  Denies LE edema on this medication.   BP Readings from Last 3 Encounters:  04/27/18 128/85  10/27/17 134/82  09/03/17 122/82   GERD- maintained on zantac 150mg  bid. Reports that his symptoms are well controlled.  He was out of meds for a few weeks and symptoms returned during this time.   He reports that he lost his dad suddenly 3 weeks ago.  He believes his dad suffered a heart attack. Reports that he is dealing with the grief pretty well. Has a lot of support from his siblings.   Review of Systems See HPI  Past Medical History:  Diagnosis Date  . Rupture spleen age 56   hx of abdominal trauma     Social History   Socioeconomic History  . Marital status: Married    Spouse name: Not on file  . Number of children: Not on file  . Years of education: Not on file  . Highest education level: Not on file  Occupational History  . Not on file  Social Needs  . Financial resource strain: Not on file  . Food insecurity:    Worry: Not on file    Inability: Not on file  . Transportation needs:    Medical: Not on file    Non-medical: Not on file  Tobacco Use  . Smoking status: Current Every Day Smoker  . Smokeless tobacco: Never Used  . Tobacco comment: 1 cigar a day  Substance and Sexual Activity  . Alcohol use: Yes    Alcohol/week: 0.0 - 0.6 oz  . Drug use: No  . Sexual activity: Not on file  Lifestyle  . Physical activity:    Days per week: Not on file    Minutes per session: Not on file  . Stress: Not on file  Relationships  . Social connections:    Talks on phone: Not on file    Gets together: Not on file    Attends religious service: Not on file    Active member of club or organization: Not on file    Attends meetings of clubs or organizations: Not on file      Relationship status: Not on file  . Intimate partner violence:    Fear of current or ex partner: Not on file    Emotionally abused: Not on file    Physically abused: Not on file    Forced sexual activity: Not on file  Other Topics Concern  . 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 They live in [place] 7/15                   Past Surgical History:  Procedure Laterality Date  . BIOPSY PROSTATE  5/13   Dr. Lowella Bandy- negative  . SPLENECTOMY      Family History  Problem Relation Age of Onset  . Lupus Mother   . Vision loss Mother        in left eye  . Hypertension Father   . Obesity Sister   . Thyroid disease Sister   . Multiple sclerosis  Sister   . Cancer Neg Hx        prostate    No Known Allergies  Current Outpatient Medications on File Prior to Visit  Medication Sig Dispense Refill  . amLODipine (NORVASC) 10 MG tablet Take 1 tablet (10 mg total) by mouth daily. 90 tablet 1  . mirabegron ER (MYRBETRIQ) 25 MG TB24 tablet Take 25 mg by mouth daily.    . ranitidine (ZANTAC) 150 MG capsule Take 1 capsule (150 mg total) by mouth 2 (two) times daily. 180 capsule 0  . cefUROXime (CEFTIN) 500 MG tablet Take 1 tablet (500 mg total) by mouth 2 (two) times daily with a meal. (Patient not taking: Reported on 04/27/2018) 20 tablet 0   No current facility-administered medications on file prior to visit.     BP 128/85 (BP Location: Right Arm, Cuff Size: Large)   Pulse 73   Temp 98.4 F (36.9 C) (Oral)   Resp 16   Ht 6\' 1"  (1.854 m)   Wt 210 lb 6.4 oz (95.4 kg)   SpO2 99%   BMI 27.76 kg/m       Objective:   Physical Exam  Constitutional: He is oriented to person, place, and time. He appears well-developed and well-nourished. No distress.  HENT:  Head: Normocephalic and atraumatic.  Cardiovascular: Normal rate and regular rhythm.  No  murmur heard. Pulmonary/Chest: Effort normal and breath sounds normal. No respiratory distress. He has no wheezes. He has no rales.  Musculoskeletal: He exhibits no edema.  Neurological: He is alert and oriented to person, place, and time.  Skin: Skin is warm and dry.  Psychiatric: He has a normal mood and affect. His behavior is normal. Thought content normal.          Assessment & Plan:  HTN- bp stable on current dose of amlodipine.  H/o spenectomy- refill provided for ceftin to have on hand.  GERD- stable on zantac, continue same.

## 2018-10-31 ENCOUNTER — Ambulatory Visit (INDEPENDENT_AMBULATORY_CARE_PROVIDER_SITE_OTHER): Payer: BLUE CROSS/BLUE SHIELD | Admitting: Family

## 2018-10-31 ENCOUNTER — Encounter: Payer: Self-pay | Admitting: Family

## 2018-10-31 VITALS — BP 129/79 | HR 68 | Temp 98.6°F | Resp 16 | Ht 73.0 in | Wt 218.4 lb

## 2018-10-31 DIAGNOSIS — Z23 Encounter for immunization: Secondary | ICD-10-CM

## 2018-10-31 DIAGNOSIS — Z0000 Encounter for general adult medical examination without abnormal findings: Secondary | ICD-10-CM | POA: Diagnosis not present

## 2018-10-31 LAB — LIPID PANEL
CHOL/HDL RATIO: 4
Cholesterol: 185 mg/dL (ref 0–200)
HDL: 47.4 mg/dL (ref >39.00)
LDL CALC: 114 mg/dL — AB (ref 0–99)
NONHDL: 137.94
TRIGLYCERIDES: 120 mg/dL (ref 0.0–149.0)
VLDL: 24 mg/dL (ref 0.0–40.0)

## 2018-10-31 LAB — CBC WITH DIFFERENTIAL/PLATELET
BASOS ABS: 0 10*3/uL (ref 0.0–0.1)
Basophils Relative: 0.3 % (ref 0.0–3.0)
EOS ABS: 0.1 10*3/uL (ref 0.0–0.7)
Eosinophils Relative: 2.3 % (ref 0.0–5.0)
HCT: 46.5 % (ref 39.0–52.0)
Hemoglobin: 15.8 g/dL (ref 13.0–17.0)
LYMPHS ABS: 1.8 10*3/uL (ref 0.7–4.0)
Lymphocytes Relative: 28.7 % (ref 12.0–46.0)
MCHC: 34 g/dL (ref 30.0–36.0)
MCV: 90.8 fl (ref 78.0–100.0)
MONOS PCT: 7.4 % (ref 3.0–12.0)
Monocytes Absolute: 0.5 10*3/uL (ref 0.1–1.0)
NEUTROS ABS: 3.8 10*3/uL (ref 1.4–7.7)
NEUTROS PCT: 61.3 % (ref 43.0–77.0)
PLATELETS: 306 10*3/uL (ref 150.0–400.0)
RBC: 5.12 Mil/uL (ref 4.22–5.81)
RDW: 13.8 % (ref 11.5–15.5)
WBC: 6.2 10*3/uL (ref 4.0–10.5)

## 2018-10-31 LAB — URINALYSIS, ROUTINE W REFLEX MICROSCOPIC
Bilirubin Urine: NEGATIVE
Hgb urine dipstick: NEGATIVE
Ketones, ur: NEGATIVE
Leukocytes, UA: NEGATIVE
Nitrite: NEGATIVE
PH: 5.5 (ref 5.0–8.0)
RBC / HPF: NONE SEEN (ref 0–?)
Specific Gravity, Urine: 1.025 (ref 1.000–1.030)
Total Protein, Urine: NEGATIVE
UROBILINOGEN UA: 0.2 (ref 0.0–1.0)
Urine Glucose: NEGATIVE

## 2018-10-31 LAB — HEPATIC FUNCTION PANEL
ALK PHOS: 55 U/L (ref 39–117)
ALT: 34 U/L (ref 0–53)
AST: 20 U/L (ref 0–37)
Albumin: 4.4 g/dL (ref 3.5–5.2)
BILIRUBIN DIRECT: 0.1 mg/dL (ref 0.0–0.3)
BILIRUBIN TOTAL: 0.6 mg/dL (ref 0.2–1.2)
TOTAL PROTEIN: 7.1 g/dL (ref 6.0–8.3)

## 2018-10-31 LAB — BASIC METABOLIC PANEL
BUN: 14 mg/dL (ref 6–23)
CALCIUM: 9.6 mg/dL (ref 8.4–10.5)
CO2: 29 mEq/L (ref 19–32)
CREATININE: 1.17 mg/dL (ref 0.40–1.50)
Chloride: 104 mEq/L (ref 96–112)
GFR: 85.91 mL/min (ref >60.00)
GLUCOSE: 92 mg/dL (ref 70–99)
Potassium: 4.3 mEq/L (ref 3.5–5.1)
SODIUM: 140 meq/L (ref 135–145)

## 2018-10-31 LAB — TSH: TSH: 1.86 u[IU]/mL (ref 0.35–4.50)

## 2018-10-31 MED ORDER — CEFUROXIME AXETIL 500 MG PO TABS: 500.0000 mg | ORAL_TABLET | Freq: Two times a day (BID) | ORAL | 0 refills | Status: DC

## 2018-10-31 NOTE — Patient Instructions (Signed)
Please complete lab work prior to leaving. Continue to work on healthy diet, exercise, weight loss.  

## 2018-10-31 NOTE — Progress Notes (Signed)
Subjective:    Patient ID: Darin Smith, male    DOB: May 25, 1971, 47 y.o.   MRN: [id number]  HPI  Patient presents today for complete physical.  Immunizations: due for Men B #2 Diet:  Has cut back on fast food.  Started bringing his lunch. Exercise: not regular Wt Readings from Last 3 Encounters:  10/31/18 218 lb 6.4 oz (99.1 kg)  04/27/18 210 lb 6.4 oz (95.4 kg)  10/27/17 213 lb (96.6 kg)  Colonoscopy:will start at 50.  PSA:  History of elevated PSA- this is being followed by urology. Dr. Gloriann Loan.  Lab Results  Component Value Date   PSA 4.01 (H) 09/12/2011   PSA 3.83 08/04/2011   PSA 1.78 05/14/2009       Review of Systems  Constitutional: Positive for unexpected weight change.  HENT: Negative for hearing loss and rhinorrhea.   Eyes: Negative for visual disturbance.  Respiratory: Negative for cough and shortness of breath.   Cardiovascular: Negative for chest pain and leg swelling.  Gastrointestinal: Negative for constipation and diarrhea.  Genitourinary: Negative for difficulty urinating, dysuria and frequency.  Musculoskeletal: Negative for arthralgias and myalgias.  Skin: Negative for rash.  Neurological: Negative for headaches.  Hematological: Negative for adenopathy.  Psychiatric/Behavioral:       Denies depression/anxiety   Past Medical History:  Diagnosis Date  . Rupture spleen age 58   hx of abdominal trauma     Social History   Socioeconomic History  . Marital status: Married    Spouse name: Not on file  . Number of children: Not on file  . Years of education: Not on file  . Highest education level: Not on file  Occupational History  . Not on file  Social Needs  . Financial resource strain: Not on file  . Food insecurity:    Worry: Not on file    Inability: Not on file  . Transportation needs:    Medical: Not on file    Non-medical: Not on file  Tobacco Use  . Smoking status: Current Every Day Smoker    Types: Cigars  . Smokeless  tobacco: Never Used  . Tobacco comment: 1 cigar a day  Substance and Sexual Activity  . Alcohol use: Yes    Alcohol/week: 1.0 - 2.0 standard drinks    Types: 1 - 2 Cans of beer per week  . Drug use: No  . Sexual activity: Not on file  Lifestyle  . Physical activity:    Days per week: Not on file    Minutes per session: Not on file  . Stress: Not on file  Relationships  . Social connections:    Talks on phone: Not on file    Gets together: Not on file    Attends religious service: Not on file    Active member of club or organization: Not on file    Attends meetings of clubs or organizations: Not on file    Relationship status: Not on file  . Intimate partner violence:    Fear of current or ex partner: Not on file    Emotionally abused: Not on file    Physically abused: Not on file    Forced sexual activity: Not on file  Other Topics Concern  . 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 They live in [place]  7/15                   Past Surgical History:  Procedure Laterality Date  . BIOPSY PROSTATE  5/13   Dr. Lowella Bandy- negative  . SPLENECTOMY      Family History  Problem Relation Age of Onset  . Lupus Mother   . Vision loss Mother        in left eye  . Hypertension Father   . COPD Father   . Obesity Sister   . Thyroid disease Sister   . Multiple sclerosis Sister   . Cancer Neg Hx        prostate    No Known Allergies  Current Outpatient Medications on File Prior to Visit  Medication Sig Dispense Refill  . amLODipine (NORVASC) 10 MG tablet Take 1 tablet (10 mg total) by mouth daily. 90 tablet 1  . mirabegron ER (MYRBETRIQ) 25 MG TB24 tablet Take 25 mg by mouth daily.    . ranitidine (ZANTAC) 150 MG capsule Take 1 capsule (150 mg total) by mouth 2 (two) times daily. 180 capsule 1   No current facility-administered medications on  file prior to visit.     BP 129/79 (BP Location: Right Arm, Cuff Size: Large)   Pulse 68   Temp 98.6 F (37 C) (Oral)   Resp 16   Ht 6\' 1"  (1.854 m)   Wt 218 lb 6.4 oz (99.1 kg)   SpO2 100%   BMI 28.81 kg/m        Objective:   Physical Exam Physical Exam  Constitutional: He is oriented to person, place, and time. He appears well-developed and well-nourished. No distress.  HENT:  Head: Normocephalic and atraumatic.  Right Ear: Tympanic membrane and ear canal normal.  Left Ear: Tympanic membrane and ear canal normal.  Mouth/Throat: Oropharynx is clear and moist.  Eyes: Pupils are equal, round, and reactive to light. No scleral icterus.  Neck: Normal range of motion. No thyromegaly present.  Cardiovascular: Normal rate and regular rhythm.   No murmur heard. Pulmonary/Chest: Effort normal and breath sounds normal. No respiratory distress. He has no wheezes. He has no rales. He exhibits no tenderness.  Abdominal: Soft. Bowel sounds are normal. He exhibits no distension and no mass. There is no tenderness. There is no rebound and no guarding.  Musculoskeletal: He exhibits no edema.  Lymphadenopathy:    He has no cervical adenopathy.  Neurological: He is alert and oriented to person, place, and time. He has normal patellar reflexes. He exhibits normal muscle tone. Coordination normal.  Skin: Skin is warm and dry.  Psychiatric: He has a normal mood and affect. His behavior is normal. Judgment and thought content normal.           Assessment & Plan:   Preventative care- discussed diet/exercise/weight loss.  Flu shot and Men B #2 today due to asplenia.  EKG tracing is personally reviewed.  EKG notes NSR.  No acute changes.        Assessment & Plan:

## 2018-12-09 ENCOUNTER — Other Ambulatory Visit: Payer: Self-pay | Admitting: Family

## 2018-12-19 ENCOUNTER — Other Ambulatory Visit: Payer: Self-pay | Admitting: Family

## 2019-04-25 ENCOUNTER — Encounter: Payer: Self-pay | Admitting: Family Medicine

## 2019-04-25 ENCOUNTER — Ambulatory Visit: Payer: BLUE CROSS/BLUE SHIELD | Admitting: Family Medicine

## 2019-04-25 ENCOUNTER — Other Ambulatory Visit: Payer: Self-pay

## 2019-04-25 DIAGNOSIS — M79672 Pain in left foot: Secondary | ICD-10-CM | POA: Diagnosis not present

## 2019-04-25 MED ORDER — DICLOFENAC SODIUM 75 MG PO TBEC: 75.0000 mg | DELAYED_RELEASE_TABLET | Freq: Two times a day (BID) | ORAL | 1 refills | Status: DC

## 2019-04-25 NOTE — Progress Notes (Signed)
PCP: Debbrah Alar, NP  Subjective:   HPI: Patient is a 48 y.o. male here for bilateral foot pain.  Patient has prior history of bilateral plantar fasciitis with a left plantar fascia rupture on the left in 2014. 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 Working Monday-Friday. Current pain really worsened on Saturday when it started with a little pain that progressed with activity and is at its worst today at 10/10 on left, sharp.  4-5/10 on right. Tried biofreeze, epsom salt bath, advil yesterday at lunch. Pain on left is posterolateral heel, right plantar foot. No skin changes, numbness.  Past Medical History:  Diagnosis Date  . Rupture spleen age 72   hx of abdominal trauma    Current Outpatient Medications on File Prior to Visit  Medication Sig Dispense Refill  . amLODipine (NORVASC) 10 MG tablet TAKE 1 TABLET BY MOUTH EVERY DAY 90 tablet 1  . cefUROXime (CEFTIN) 500 MG tablet Take 1 tablet (500 mg total) by mouth 2 (two) times daily with a meal. 20 tablet 0  . mirabegron ER (MYRBETRIQ) 25 MG TB24 tablet Take 25 mg by mouth daily.    . ranitidine (ZANTAC) 150 MG capsule TAKE 1 CAPSULE BY MOUTH TWICE A DAY 180 capsule 1   No current facility-administered medications on file prior to visit.     Past Surgical History:  Procedure Laterality Date  . BIOPSY PROSTATE  5/13   Dr. Lowella Bandy- negative  . SPLENECTOMY      No Known Allergies  Social History   Socioeconomic History  . Marital status: Married    Spouse name: Not on file  . Number of children: Not on file  . Years of education: Not on file  . Highest education level: Not on file  Occupational History  . Not on file  Social Needs  . Financial resource strain: Not on file  . Food insecurity:    Worry: Not on file    Inability: Not on file  . Transportation needs:    Medical: Not on file    Non-medical: Not on file  Tobacco Use  . Smoking status: Current Every Day  Smoker    Types: Cigars  . Smokeless tobacco: Never Used  . Tobacco comment: 1 cigar a day  Substance and Sexual Activity  . Alcohol use: Yes    Alcohol/week: 1.0 - 2.0 standard drinks    Types: 1 - 2 Cans of beer per week  . Drug use: No  . Sexual activity: Not on file  Lifestyle  . Physical activity:    Days per week: Not on file    Minutes per session: Not on file  . Stress: Not on file  Relationships  . Social connections:    Talks on phone: Not on file    Gets together: Not on file    Attends religious service: Not on file    Active member of club or organization: Not on file    Attends meetings of clubs or organizations: Not on file    Relationship status: Not on file  . Intimate partner violence:    Fear of current or ex partner: Not on file    Emotionally abused: Not on file    Physically abused: Not on file    Forced sexual activity: Not on file  Other Topics Concern  . 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 They live in [place]   Remarried 7/15                   Family History  Problem Relation Age of Onset  . Lupus Mother   . Vision loss Mother        in left eye  . Hypertension Father   . COPD Father   . Obesity Sister   . Thyroid disease Sister   . Multiple sclerosis Sister   . Cancer Neg Hx        prostate    BP (!) 152/74   Ht 6\' 2"  (1.88 m)   Wt 215 lb (97.5 kg)   BMI 27.60 kg/m   Review of Systems: See HPI above.     Objective:  Physical Exam:  Gen: NAD, comfortable in exam room  Left foot/ankle: Mod pronation.  No other gross deformity, swelling, ecchymoses FROM with 5/5 strength all directions. TTP minimally lateral distal achilles.  No other tenderness of foot or ankle. Negative ant drawer and talar tilt.   Negative syndesmotic compression. Negative calcaneal squeeze. Thompsons test negative. NV intact  distally.  Right foot/ankle: Mod pronation.  No other gross deformity, swelling, ecchymoses FROM with 5/5 strength all directions. TTP in body of plantar fascia. Negative ant drawer and talar tilt.   Negative syndesmotic compression. Thompsons test negative. NV intact distally.   Assessment & Plan:  1. 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  Start diclofenac twice a day with food given severity.  Cam walker with heel lift or heel cup.  Also discussed sports insoles with scaphoid pads and heel lifts.  Shown home rehab exercises to do daily.  Icing, avoid uneven ground.  F/u in 4 weeks.

## 2019-04-25 NOTE — Patient Instructions (Signed)
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 Diclofenac 75mg  twice a day with food for pain and inflammation. Most conservative way to rest this is the boot with a heel lift or heel cup. Consider arch supports with scaphoid pads and heel lifts (we'd need your shoes to fit these in there if you want to go this route). Wait at least 2-3 days before doing these exercises: Calf raises 3 sets of 10 on level ground once a day first. When these are easy, can do them one legged 3 sets of 10. Finally advance to doing them on a step. Can add heel walks, toe walks forward and backward as well Icing 15 minutes at a time 3-4 times a day  Avoid uneven ground, hills as much as possible. Consider physical therapy, orthotics, nitro patches if not improving as expected. Let me know how you're doing over the next 1-2 weeks otherwise follow up with me in 4 weeks.

## 2019-05-01 ENCOUNTER — Other Ambulatory Visit: Payer: Self-pay

## 2019-05-01 ENCOUNTER — Ambulatory Visit: Payer: BLUE CROSS/BLUE SHIELD | Admitting: Family Medicine

## 2019-05-01 ENCOUNTER — Encounter: Payer: Self-pay | Admitting: Family Medicine

## 2019-05-01 ENCOUNTER — Ambulatory Visit (INDEPENDENT_AMBULATORY_CARE_PROVIDER_SITE_OTHER): Payer: BLUE CROSS/BLUE SHIELD | Admitting: Family

## 2019-05-01 VITALS — BP 143/97 | HR 85 | Ht 74.0 in | Wt 215.0 lb

## 2019-05-01 DIAGNOSIS — I10 Essential (primary) hypertension: Secondary | ICD-10-CM | POA: Diagnosis not present

## 2019-05-01 DIAGNOSIS — Z9081 Acquired absence of spleen: Secondary | ICD-10-CM | POA: Diagnosis not present

## 2019-05-01 DIAGNOSIS — R972 Elevated prostate specific antigen [PSA]: Secondary | ICD-10-CM

## 2019-05-01 DIAGNOSIS — M722 Plantar fascial fibromatosis: Secondary | ICD-10-CM | POA: Diagnosis not present

## 2019-05-01 DIAGNOSIS — M7660 Achilles tendinitis, unspecified leg: Secondary | ICD-10-CM

## 2019-05-01 DIAGNOSIS — K219 Gastro-esophageal reflux disease without esophagitis: Secondary | ICD-10-CM

## 2019-05-01 DIAGNOSIS — M7662 Achilles tendinitis, left leg: Secondary | ICD-10-CM

## 2019-05-01 MED ORDER — CEFUROXIME AXETIL 500 MG PO TABS: 500.0000 mg | ORAL_TABLET | Freq: Two times a day (BID) | ORAL | 0 refills | Status: DC

## 2019-05-01 MED ORDER — SILDENAFIL CITRATE 20 MG PO TABS: ORAL_TABLET | ORAL | 0 refills | Status: DC

## 2019-05-01 MED ORDER — NITROGLYCERIN 0.2 MG/HR TD PT24: MEDICATED_PATCH | TRANSDERMAL | 1 refills | Status: DC

## 2019-05-01 MED ORDER — OMEPRAZOLE 20 MG PO CPDR: 20.0000 mg | DELAYED_RELEASE_CAPSULE | Freq: Every day | ORAL | 1 refills | Status: DC

## 2019-05-01 NOTE — Progress Notes (Signed)
. PCP: Debbrah Alar, NP  Subjective:   HPI: Patient is a 48 y.o. male here for bilateral foot pain.  4/28:  Patient has prior history of bilateral plantar fasciitis with a left plantar fascia rupture on the left in 2014. 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 Working Monday-Friday. Current pain really worsened on Saturday when it started with a little pain that progressed with activity and is at its worst today at 10/10 on left, sharp.  4-5/10 on right. Tried biofreeze, epsom salt bath, advil yesterday at lunch. Pain on left is posterolateral heel, right plantar foot. No skin changes, numbness.  5/4: Patient returns today with left Achilles pain.  He reports worsening pain which is 10/10 and sharp.  Pain worse with ambulation even with heel lifts.  He tried doing the home exercises but was unable.  He has been taking the diclofenac twice daily but feels it has not been helpful.  He does get some relief with soaking in ice water.  Denies any new injury.  No erythema.  No numbness or tingling distally.  No skin changes.  Past Medical History:  Diagnosis Date  . Rupture spleen age 30   hx of abdominal trauma    Current Outpatient Medications on File Prior to Visit  Medication Sig Dispense Refill  . amLODipine (NORVASC) 10 MG tablet TAKE 1 TABLET BY MOUTH EVERY DAY 90 tablet 1  . cefUROXime (CEFTIN) 500 MG tablet Take 1 tablet (500 mg total) by mouth 2 (two) times daily with a meal. 20 tablet 0  . diclofenac (VOLTAREN) 75 MG EC tablet Take 1 tablet (75 mg total) by mouth 2 (two) times daily. 60 tablet 1  . mirabegron ER (MYRBETRIQ) 25 MG TB24 tablet Take 25 mg by mouth daily.    Marland Kitchen omeprazole (PRILOSEC) 20 MG capsule Take 1 capsule (20 mg total) by mouth daily. 90 capsule 1  . sildenafil (REVATIO) 20 MG tablet Take as needed prior to sexual activity 10 tablet 0   No current facility-administered medications on file prior to visit.     Past  Surgical History:  Procedure Laterality Date  . BIOPSY PROSTATE  5/13   Dr. Lowella Bandy- negative  . SPLENECTOMY      No Known Allergies  Social History   Socioeconomic History  . Marital status: Married    Spouse name: Not on file  . Number of children: Not on file  . Years of education: Not on file  . Highest education level: Not on file  Occupational History  . Not on file  Social Needs  . Financial resource strain: Not on file  . Food insecurity:    Worry: Not on file    Inability: Not on file  . Transportation needs:    Medical: Not on file    Non-medical: Not on file  Tobacco Use  . Smoking status: Current Every Day Smoker    Types: Cigars  . Smokeless tobacco: Never Used  . Tobacco comment: 1 cigar a day  Substance and Sexual Activity  . Alcohol use: Yes    Alcohol/week: 1.0 - 2.0 standard drinks    Types: 1 - 2 Cans of beer per week  . Drug use: No  . Sexual activity: Not on file  Lifestyle  . Physical activity:    Days per week: Not on file    Minutes per session: Not on file  . Stress:  Not on file  Relationships  . Social connections:    Talks on phone: Not on file    Gets together: Not on file    Attends religious service: Not on file    Active member of club or organization: Not on file    Attends meetings of clubs or organizations: Not on file    Relationship status: Not on file  . Intimate partner violence:    Fear of current or ex partner: Not on file    Emotionally abused: Not on file    Physically abused: Not on file    Forced sexual activity: Not on file  Other Topics Concern  . 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 They live in [place]   Remarried 7/15                   Family History  Problem Relation Age of Onset  . Lupus Mother   . Vision loss Mother        in left eye  . Hypertension Father   . COPD Father    . Obesity Sister   . Thyroid disease Sister   . Multiple sclerosis Sister   . Cancer Neg Hx        prostate    BP (!) 143/97   Pulse 85   Ht 6\' 2"  (1.88 m)   Wt 215 lb (97.5 kg)   BMI 27.60 kg/m   Review of Systems: See HPI above.     Objective:  Physical Exam:  GEN: Awake, alert, no acute distress Pulmonary: Breathing unlabored  Left foot/ankle: - Inspection: No obvious deformity, erythema, swelling, or ecchymosis - Palpation: Tenderness over the mid substance Achilles.  No insertional tenderness.  No bony tenderness.  He does have some tenderness over the medial calcaneal tubercle as well - Strength: Normal strength with dorsiflexion, plantarflexion, inversion, and eversion.  Pain with resisted plantarflexion - ROM: Full ROM - Neuro/vasc: NV intact Negative Thomas test  MS[facility] [place]: Limited ultrasound of the left Achilles performed.  Trace amount of fluid seen within the retrocalcaneal bursa.  The Achilles tendon is intact with no obvious swelling or tears.  Right foot/ankle: No obvious deformity No tenderness over the Achilles tendon Full range of motion N/V intact distally   Assessment & Plan:  1.  Left Achilles tendinitis-patient continues to have significant pain which is making activity difficult.  Not noting improvement with rehab exercises or the diclofenac over the past week. - Cam walker.  Patient states he does have one at home from a previous injury he will wear this for the majority of the day as we discussed.  He will place a heel lift in this. - Nitroglycerin patches - Discontinue diclofenac, and switch to Aleve - Out of work for the next 2 weeks - Follow-up in 2 weeks

## 2019-05-01 NOTE — Patient Instructions (Addendum)
Wear the boot with heel lift when up and walking around for next 2 weeks. Continue icing. Stop the home exercises now but come out of the boot at least twice a day to do simple motion exercises. Stop diclofenac and try aleve 2 tabs twice a day with food instead. Topical aspercreme, salon pas patches may help. Nitro patches 1/4th patch to affected area, change daily. Out of work for 2 weeks. Follow up with me at that time. We will consider physical therapy, increase nitro patches, those new feet we've been discussing.

## 2019-05-01 NOTE — Progress Notes (Signed)
Virtual Visit via Video Note  I connected with Darin Smith on 05/01/19 at  7:00 AM EDT by a video enabled telemedicine application and verified that I am speaking with the correct person using two identifiers.  Location: Patient: home Provider: home   I discussed the limitations of evaluation and management by telemedicine and the availability of in person appointments. The patient expressed understanding and agreed to proceed.  History of Present Illness:  Patient is a 48 yr old male who presents today for follow up.  HTN- Patient states that he was seen by sports medicine last week and his blood pressure was elevated. Reports that he has been compliant with amlodipine.   BP Readings from Last 3 Encounters:  04/25/19 (!) 152/74  10/31/18 129/79  04/27/18 128/85   GERD- reports that he is almost out of zantac.  Zantac is no longer being manufactured due to recall.   Plantar fasciitis/achilles tendinopathy- this is being managed by Dr. Barbaraann Barthel. He is maintained on diclofenac. Continues to have pain. He has follow up with Dr. Barbaraann Barthel this week.    Observations/Objective:  Gen: Awake, alert, no acute distress Resp: Breathing is even and non-labored Psych: calm/pleasant demeanor Neuro: Alert and Oriented x 3, + facial symmetry, speech is clear.   Assessment and Plan:  HTN- will await follow up blood pressure check at his follow up sports medicine visit.  Continue current dose of amlodipine for now. Further recommendations pending review of blood pressure.  GERD-  Stable. D/c zantac, trial of omeprazole 20mg  once daily.  Plantar fasciitis/achilles tendinopathy- work up and treatment ongoing- management per sports medicine.  Asplenia- refilled rx for ceftin to use prn illness.  Hx of elevated PSA- following with [facility]. Also being treated with prn sildenafil for ED.   Follow Up Instructions:    I discussed the assessment and treatment plan with the patient. The  patient was provided an opportunity to ask questions and all were answered. The patient agreed with the plan and demonstrated an understanding of the instructions.   The patient was advised to call back or seek an in-person evaluation if the symptoms worsen or if the condition fails to improve as anticipated.  [name], NP

## 2019-05-02 ENCOUNTER — Telehealth: Payer: Self-pay | Admitting: Family

## 2019-05-02 MED ORDER — METOPROLOL SUCCINATE ER 50 MG PO TB24: 50.0000 mg | ORAL_TABLET | Freq: Every day | ORAL | 0 refills | Status: DC

## 2019-05-02 NOTE — Telephone Encounter (Signed)
I contacted pt and let him know that I reviewed his blood pressure reading from his visit with Dr. Barbaraann Barthel yesterday.   Will add toprol xl 50mg  once daily.  BP Readings from Last 3 Encounters:  05/01/19 (!) 143/97  04/25/19 (!) 152/74  10/31/18 129/79   Pt will check BP and HR at home for a few days after starting toprol and will send me his readings via mychart.

## 2019-05-15 ENCOUNTER — Encounter: Payer: Self-pay | Admitting: Family Medicine

## 2019-05-15 ENCOUNTER — Other Ambulatory Visit: Payer: Self-pay

## 2019-05-15 ENCOUNTER — Ambulatory Visit (INDEPENDENT_AMBULATORY_CARE_PROVIDER_SITE_OTHER): Payer: BLUE CROSS/BLUE SHIELD | Admitting: Family Medicine

## 2019-05-15 VITALS — BP 138/97 | HR 73 | Ht 74.0 in | Wt 215.0 lb

## 2019-05-15 DIAGNOSIS — M7662 Achilles tendinitis, left leg: Secondary | ICD-10-CM

## 2019-05-15 NOTE — Progress Notes (Signed)
. PCP: Debbrah Alar, NP  Subjective:   HPI: Patient is a 48 y.o. male here for bilateral foot pain.  4/28:  Patient has prior history of bilateral plantar fasciitis with a left plantar fascia rupture on the left in 2014. 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 Working Monday-Friday. Current pain really worsened on Saturday when it started with a little pain that progressed with activity and is at its worst today at 10/10 on left, sharp.  4-5/10 on right. Tried biofreeze, epsom salt bath, advil yesterday at lunch. Pain on left is posterolateral heel, right plantar foot. No skin changes, numbness.  5/4: Patient returns today with left Achilles pain.  He reports worsening pain which is 10/10 and sharp.  Pain worse with ambulation even with heel lifts.  He tried doing the home exercises but was unable.  He has been taking the diclofenac twice daily but feels it has not been helpful.  He does get some relief with soaking in ice water.  Denies any new injury.  No erythema.  No numbness or tingling distally.  No skin changes.  5/18: Patient reports he feels better compared to last visit. Pain level down to 2/10. Wore cam walker for 1.5-2 weeks. Now using heel lifts in shoes but feels they may be worsening some back pain - feels from low back up to upper thoracic area more on right side. Taking aleve, nitro. Feels nitro patches are helping significantly. No skin changes.  Past Medical History:  Diagnosis Date  . Rupture spleen age 59   hx of abdominal trauma    Current Outpatient Medications on File Prior to Visit  Medication Sig Dispense Refill  . amLODipine (NORVASC) 10 MG tablet TAKE 1 TABLET BY MOUTH EVERY DAY 90 tablet 1  . cefUROXime (CEFTIN) 500 MG tablet Take 1 tablet (500 mg total) by mouth 2 (two) times daily with a meal. 20 tablet 0  . diclofenac (VOLTAREN) 75 MG EC tablet Take 1 tablet (75 mg total) by mouth 2 (two) times daily. 60  tablet 1  . metoprolol succinate (TOPROL-XL) 50 MG 24 hr tablet Take 1 tablet (50 mg total) by mouth daily. Take with or immediately following a meal. 90 tablet 0  . mirabegron ER (MYRBETRIQ) 25 MG TB24 tablet Take 25 mg by mouth daily.    . nitroGLYCERIN (NITRODUR - DOSED IN MG/24 HR) 0.2 mg/hr patch Apply 1/4th patch to affected achilles, change daily 30 patch 1  . omeprazole (PRILOSEC) 20 MG capsule Take 1 capsule (20 mg total) by mouth daily. 90 capsule 1   No current facility-administered medications on file prior to visit.     Past Surgical History:  Procedure Laterality Date  . BIOPSY PROSTATE  5/13   Dr. Lowella Bandy- negative  . SPLENECTOMY      No Known Allergies  Social History   Socioeconomic History  . Marital status: Married    Spouse name: Not on file  . Number of children: Not on file  . Years of education: Not on file  . Highest education level: Not on file  Occupational History  . Not on file  Social Needs  . Financial resource strain: Not on file  . Food insecurity:    Worry: Not on file    Inability: Not on file  . Transportation needs:    Medical: Not on file    Non-medical: Not on file  Tobacco  Use  . Smoking status: Current Every Day Smoker    Types: Cigars  . Smokeless tobacco: Never Used  . Tobacco comment: 1 cigar a day  Substance and Sexual Activity  . Alcohol use: Yes    Alcohol/week: 1.0 - 2.0 standard drinks    Types: 1 - 2 Cans of beer per week  . Drug use: No  . Sexual activity: Not on file  Lifestyle  . Physical activity:    Days per week: Not on file    Minutes per session: Not on file  . Stress: Not on file  Relationships  . Social connections:    Talks on phone: Not on file    Gets together: Not on file    Attends religious service: Not on file    Active member of club or organization: Not on file    Attends meetings of clubs or organizations: Not on file    Relationship status: Not on file  . Intimate partner violence:     Fear of current or ex partner: Not on file    Emotionally abused: Not on file    Physically abused: Not on file    Forced sexual activity: Not on file  Other Topics Concern  . 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 They live in [place]   Remarried 7/15                   Family History  Problem Relation Age of Onset  . Lupus Mother   . Vision loss Mother        in left eye  . Hypertension Father   . COPD Father   . Obesity Sister   . Thyroid disease Sister   . Multiple sclerosis Sister   . Cancer Neg Hx        prostate    BP (!) 138/97   Pulse 73   Ht 6\' 2"  (1.88 m)   Wt 215 lb (97.5 kg)   BMI 27.60 kg/m   Review of Systems: See HPI above.     Objective:  Physical Exam:  Gen: NAD, comfortable in exam room  Left foot/ankle: No gross deformity, swelling, ecchymoses FROM with 5/5 strength all directions - mild pain bilateral calf raise. TTP achilles at insertion. Negative ant drawer and talar tilt.   Negative syndesmotic compression. Thompsons test negative. NV intact distally.  Back: No deformity. FROM with 5/5 strength. TTP bilateral lumbar paraspinal regions, right thoracic paraspinal region.  No midline/bony tenderness. NVI distally.   Assessment & Plan:  1.  Left Achilles tendinitis- improving with nitro patches, s/p cam walker, now using heel lifts, aleve.  Add theraband strengthening now.  Continue heel lifts, aleve, increase nitro to 1/2 patch and change daily.  F/u in 4 weeks. Light duty note written.  2. Back pain - likely due to cam walker altering gait mechanics.  Shown home exercises and stretches to do daily.  Aleve, heat.

## 2019-05-15 NOTE — Patient Instructions (Signed)
Continue the heel lifts if you can. Increase nitro patches to 1/2 patch, change daily. Start theraband exercises up to 3 sets of 10 once a day (you can start lower and work up to this though). Aleve twice a day with food as needed. Heat as needed 15 minutes at a time for your back. Consider physical therapy if you're struggling. Follow up with me in 4 weeks.

## 2019-05-23 ENCOUNTER — Ambulatory Visit: Payer: BLUE CROSS/BLUE SHIELD | Admitting: Family Medicine

## 2019-06-11 ENCOUNTER — Other Ambulatory Visit: Payer: Self-pay | Admitting: Family

## 2019-06-12 ENCOUNTER — Other Ambulatory Visit: Payer: Self-pay

## 2019-06-12 ENCOUNTER — Encounter: Payer: Self-pay | Admitting: Family Medicine

## 2019-06-12 ENCOUNTER — Ambulatory Visit: Payer: BC Managed Care – PPO | Admitting: Family Medicine

## 2019-06-12 VITALS — BP 136/92 | HR 68 | Ht 74.0 in | Wt 216.0 lb

## 2019-06-12 DIAGNOSIS — M7672 Peroneal tendinitis, left leg: Secondary | ICD-10-CM | POA: Diagnosis not present

## 2019-06-12 NOTE — Patient Instructions (Signed)
You have peroneal tendinopathy; your achilles is improving. Wean off the heel lifts as tolerated. Continue nitro patches - 1/2 patch and change daily. Theraband exercises, calf raises 3 sets of 10 once a day. Aleve twice a day with food as needed. Heat as needed 15 minutes at a time for your back. Consider physical therapy if you're struggling. Follow up with me in 1 month. See work note for restrictions.

## 2019-06-12 NOTE — Progress Notes (Signed)
. PCP: Debbrah Alar, NP  Subjective:   HPI: Patient is a 48 y.o. male here for bilateral foot pain.  4/28:  Patient has prior history of bilateral plantar fasciitis with a left plantar fascia rupture on the left in 2014. 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 Working Monday-Friday. Current pain really worsened on Saturday when it started with a little pain that progressed with activity and is at its worst today at 10/10 on left, sharp.  4-5/10 on right. Tried biofreeze, epsom salt bath, advil yesterday at lunch. Pain on left is posterolateral heel, right plantar foot. No skin changes, numbness.  5/4: Patient returns today with left Achilles pain.  He reports worsening pain which is 10/10 and sharp.  Pain worse with ambulation even with heel lifts.  He tried doing the home exercises but was unable.  He has been taking the diclofenac twice daily but feels it has not been helpful.  He does get some relief with soaking in ice water.  Denies any new injury.  No erythema.  No numbness or tingling distally.  No skin changes.  5/18: Patient reports he feels better compared to last visit. Pain level down to 2/10. Wore cam walker for 1.5-2 weeks. Now using heel lifts in shoes but feels they may be worsening some back pain - feels from low back up to upper thoracic area more on right side. Taking aleve, nitro. Feels nitro patches are helping significantly. No skin changes.  6/15: Pt reports continued discomfort 2-3/10. Worst over the posterior lateral part of the ankle. He feels that he was starting to do well until 2 weeks ago and believes things were reaggravated but denies any specific injury. At the time, he was still wearing the walking boot. He does report pain with walking. He continues to use heel lifts and use nitro patches. Taking aleve as needed. Tolerating medications well. No numbness/tingling. No swelling. No skin changes. He also notes  that his back pain has resolved.  Past Medical History:  Diagnosis Date  . 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 amLODipine (NORVASC) 10 MG tablet TAKE 1 TABLET BY MOUTH EVERY DAY 90 tablet 1  . cefUROXime (CEFTIN) 500 MG tablet Take 1 tablet (500 mg total) by mouth 2 (two) times daily with a meal. 20 tablet 0  . diclofenac (VOLTAREN) 75 MG EC tablet Take 1 tablet (75 mg total) by mouth 2 (two) times daily. 60 tablet 1  . metoprolol succinate (TOPROL-XL) 50 MG 24 hr tablet Take 1 tablet (50 mg total) by mouth daily. Take with or immediately following a meal. 90 tablet 0  . mirabegron ER (MYRBETRIQ) 25 MG TB24 tablet Take 25 mg by mouth daily.    . nitroGLYCERIN (NITRODUR - DOSED IN MG/24 HR) 0.2 mg/hr patch Apply 1/4th patch to affected achilles, change daily 30 patch 1  . omeprazole (PRILOSEC) 20 MG capsule Take 1 capsule (20 mg total) by mouth daily. 90 capsule 1   No current facility-administered medications on file prior to visit.     Past Surgical History:  Procedure Laterality Date  . BIOPSY PROSTATE  5/13   Dr. Lowella Bandy- negative  . SPLENECTOMY      No Known Allergies  Social History   Socioeconomic History  . Marital status: Married  Spouse name: Not on file  . Number of children: Not on file  . Years of education: Not on file  . Highest education level: Not on file  Occupational History  . Not on file  Social Needs  . Financial resource strain: Not on file  . Food insecurity    Worry: Not on file    Inability: Not on file  . Transportation needs    Medical: Not on file    Non-medical: Not on file  Tobacco Use  . Smoking status: Current Every Day Smoker    Types: Cigars  . Smokeless tobacco: Never Used  . Tobacco comment: 1 cigar a day  Substance and Sexual Activity  . Alcohol use: Yes    Alcohol/week: 1.0 - 2.0 standard drinks    Types: 1 - 2 Cans of  beer per week  . Drug use: No  . Sexual activity: Not on file  Lifestyle  . Physical activity    Days per week: Not on file    Minutes per session: Not on file  . Stress: Not on file  Relationships  . Social Herbalist on phone: Not on file    Gets together: Not on file    Attends religious service: Not on file    Active member of club or organization: Not on file    Attends meetings of clubs or organizations: Not on file    Relationship status: Not on file  . Intimate partner violence    Fear of current or ex partner: Not on file    Emotionally abused: Not on file    Physically abused: Not on file    Forced sexual activity: Not on file  Other Topics Concern  . 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 They live in [place]   Remarried 7/15                   Family History  Problem Relation Age of Onset  . Lupus Mother   . Vision loss Mother        in left eye  . Hypertension Father   . COPD Father   . Obesity Sister   . Thyroid disease Sister   . Multiple sclerosis Sister   . Cancer Neg Hx        prostate    BP (!) 136/92   Pulse 68   Ht 6\' 2"  (1.88 m)   Wt 216 lb (98 kg)   BMI 27.73 kg/m   Review of Systems: See HPI above.     Objective:  Physical Exam:  GEN: awake, alert, NAD Pulm: breathing unlabored  Left Ankle: - Inspection: No obvious deformity, erythema, swelling, or ecchymosis - Palpation: No significant TTP over the achilles. There is TTP posterior to the lateral malleolus at the peroneal tendons - Strength: Normal strength with dorsiflexion, plantarflexion, inversion, and eversion. Mild pain reproduced with resisted eversion.  - ROM: Full ROM - Neuro/vasc: NV intact - negative anterior drawer   Assessment & Plan:  1.  Left ankle pain-  Achilles pain is improving but still present - peroneal tendons worsening from  compensation. - will continue NTG - add additional home strengthening, up until now he has been doing primarily stretching - wean out of heel lifts - anticipate return to work in about 3 weeks (07/03/2019).  Ideally, would start with half-days for gradual return to full duty. If continuing to struggle, will refer to formal physical therapy.  2. Upper back pain - resolved

## 2019-06-14 ENCOUNTER — Other Ambulatory Visit: Payer: Self-pay | Admitting: Family Medicine

## 2019-07-12 ENCOUNTER — Other Ambulatory Visit: Payer: Self-pay

## 2019-07-12 ENCOUNTER — Ambulatory Visit: Payer: BC Managed Care – PPO | Admitting: Family Medicine

## 2019-07-12 ENCOUNTER — Encounter: Payer: Self-pay | Admitting: Family Medicine

## 2019-07-12 VITALS — BP 126/89 | HR 66 | Ht 74.0 in | Wt 213.6 lb

## 2019-07-12 DIAGNOSIS — M7662 Achilles tendinitis, left leg: Secondary | ICD-10-CM | POA: Diagnosis not present

## 2019-07-12 MED ORDER — PENNSAID 2 % TD SOLN: 1.0000 "application " | Freq: Two times a day (BID) | TRANSDERMAL | 3 refills | Status: DC

## 2019-07-12 NOTE — Progress Notes (Signed)
Darin Smith - 48 y.o. male MRN [id number]  Date of birth: 07/22/1971  SUBJECTIVE:  Including CC & ROS.  Chief Complaint  Patient presents with  . Follow-up    follow up for left foot    Darin Smith is a 48 y.o. male that is following up for left Achilles tendinitis and left peroneal tendinitis and plantar fasciitis bilaterally.  He has been out of work since April.  They do not have light duty for him.  He walks roughly 8 miles per day when he is at a regular workday.  He reports no pain today.  He is tried nitroglycerin patches and home exercise with improvement.  He has been out walking in the street and does not notice any significant pain.  He has some tightness.  Feels like he is ready to get back to work.   Review of Systems  Constitutional: Negative for fever.  HENT: Negative for congestion.   Respiratory: Negative for cough.   Cardiovascular: Negative for chest pain.  Gastrointestinal: Negative for abdominal pain.  Musculoskeletal: Negative for gait problem.  Skin: Negative for color change.  Neurological: Negative for weakness.  Hematological: Negative for adenopathy.    HISTORY: Past Medical, Surgical, Social, and Family History Reviewed & Updated per EMR.   Pertinent Historical Findings include:  Past Medical History:  Diagnosis Date  . Rupture spleen age 83   hx of abdominal trauma    Past Surgical History:  Procedure Laterality Date  . BIOPSY PROSTATE  5/13   Dr. Lowella Bandy- negative  . SPLENECTOMY      No Known Allergies  Family History  Problem Relation Age of Onset  . Lupus Mother   . Vision loss Mother        in left eye  . Hypertension Father   . COPD Father   . Obesity Sister   . Thyroid disease Sister   . Multiple sclerosis Sister   . Cancer Neg Hx        prostate     Social History   Socioeconomic History  . Marital status: Married    Spouse name: Not on file  . Number of children: Not on file  . Years of education: Not on file   . Highest education level: Not on file  Occupational History  . Not on file  Social Needs  . Financial resource strain: Not on file  . Food insecurity    Worry: Not on file    Inability: Not on file  . Transportation needs    Medical: Not on file    Non-medical: Not on file  Tobacco Use  . Smoking status: Current Every Day Smoker    Types: Cigars  . Smokeless tobacco: Never Used  . Tobacco comment: 1 cigar a day  Substance and Sexual Activity  . Alcohol use: Yes    Alcohol/week: 1.0 - 2.0 standard drinks    Types: 1 - 2 Cans of beer per week  . Drug use: No  . Sexual activity: Not on file  Lifestyle  . Physical activity    Days per week: Not on file    Minutes per session: Not on file  . Stress: Not on file  Relationships  . Social Herbalist on phone: Not on file    Gets together: Not on file    Attends religious service: Not on file    Active member of club or organization: Not on file  Attends meetings of clubs or organizations: Not on file    Relationship status: Not on file  . Intimate partner violence    Fear of current or ex partner: Not on file    Emotionally abused: Not on file    Physically abused: Not on file    Forced sexual activity: Not on file  Other Topics Concern  . 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 They live in [place]   Remarried 7/15                    PHYSICAL EXAM:  VS: BP 126/89   Pulse 66   Ht 6\' 2"  (1.88 m)   Wt 213 lb 9.6 oz (96.9 kg)   BMI 27.42 kg/m  Physical Exam Gen: NAD, alert, cooperative with exam, well-appearing ENT: normal lips, normal nasal mucosa,  Eye: normal EOM, normal conjunctiva and lids CV:  no edema, +2 pedal pulses   Resp: no accessory muscle use, non-labored,  Skin: no rashes, no areas of induration  Neuro: normal tone, normal sensation to touch Psych:  normal insight,  alert and oriented MSK:  Left ankle/foot:  Normal range of motion. No Haglund's deformity. No tenderness palpation over the Achilles or peroneal tendons. Normal strength resistance. Neurovascularly intact     ASSESSMENT & PLAN:   Tendonitis, Achilles, left Feels significant improvement of his symptoms.  Feels that he is ready to go back to work. -[employer]. -Counseled on home exercise therapy and supportive care. -Provided a work note with returning back to full duty on 7/20.

## 2019-07-12 NOTE — Assessment & Plan Note (Signed)
Feels significant improvement of his symptoms.  Feels that he is ready to go back to work. -[employer]. -Counseled on home exercise therapy and supportive care. -Provided a work note with returning back to full duty on 7/20.

## 2019-07-12 NOTE — Patient Instructions (Signed)
Nice to meet you Please continue the exercises  Please try the rub on medicine  Please send me a message in MyChart with any questions or updates.  Please see me back if needed.   --Dr. Raeford Razor

## 2019-07-25 ENCOUNTER — Other Ambulatory Visit: Payer: Self-pay | Admitting: Family

## 2019-08-13 ENCOUNTER — Emergency Department
Admission: EM | Admit: 2019-08-13 | Discharge: 2019-08-13 | Disposition: A | Discharge status: Home / Self Care | Payer: BC Managed Care – PPO | Source: Home / Self Care | Attending: Family Medicine | Admitting: Family Medicine

## 2019-08-13 ENCOUNTER — Other Ambulatory Visit: Payer: Self-pay

## 2019-08-13 DIAGNOSIS — R1030 Lower abdominal pain, unspecified: Secondary | ICD-10-CM

## 2019-08-13 DIAGNOSIS — R109 Unspecified abdominal pain: Secondary | ICD-10-CM | POA: Diagnosis not present

## 2019-08-13 LAB — POCT CBC W AUTO DIFF (K'VILLE URGENT CARE)

## 2019-08-13 LAB — POCT URINALYSIS DIP (MANUAL ENTRY)
Blood, UA: NEGATIVE
Glucose, UA: NEGATIVE mg/dL
Ketones, POC UA: NEGATIVE mg/dL
Leukocytes, UA: NEGATIVE
Nitrite, UA: NEGATIVE
Spec Grav, UA: >=1.03 — AB (ref 1.010–1.025)
Urobilinogen, UA: 0.2 E.U./dL
pH, UA: 5.5 (ref 5.0–8.0)

## 2019-08-13 NOTE — ED Provider Notes (Signed)
[name]    CSN: [id number] Arrival date & time: 08/13/19  1432     History   Chief Complaint Chief Complaint  Patient presents with  . Flank Pain    LT side    HPI Darin Smith is a 48 y.o. male.   [place] beach five days ago patient suddenly felt vague pain in his left flank.  The pain has recurred intermittently, but he has felt well otherwise.  He awoke this morning with the pain again, which he describes as a "hard punch" in his side.  He denies hematuria or other changes in urination and his bowel movements have been normal.  He recalls no injury and denies rash.  Aleve is somewhat helpful.  The history is provided by the patient.  Abdominal Pain Pain location:  L flank Pain quality: dull and stabbing   Pain radiates to:  Does not radiate Pain severity:  Severe Onset quality:  Sudden Duration:  5 days Timing:  Sporadic Progression:  Unchanged Chronicity:  New Context: recent travel   Context: not awakening from sleep, not diet changes, not eating, not previous surgeries, not recent illness, not sick contacts, not suspicious food intake and not trauma   Relieved by:  NSAIDs Worsened by:  Nothing Associated symptoms: no anorexia, no belching, no chest pain, no chills, no constipation, no cough, no diarrhea, no dysuria, no fatigue, no fever, no flatus, no hematemesis, no hematochezia, no hematuria, no melena, no nausea, no shortness of breath and no vomiting     Past Medical History:  Diagnosis Date  . Rupture spleen age 89   hx of abdominal trauma    Patient Active Problem List   Diagnosis Date Noted  . Tendonitis, Achilles, left 07/12/2019  . Allergic rhinitis 04/22/2016  . Routine general medical examination at a health care facility 09/24/2014  . HTN (hypertension) 09/24/2014  . Plantar fascia rupture 08/07/2013  . GERD (gastroesophageal reflux disease) 08/12/2012  . Elevated PSA 08/12/2012  . Preventative health care  08/04/2011  . NOCTURIA 05/14/2009  . 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 BIOPSY PROSTATE  5/13   Dr. Lowella Bandy- negative  . SPLENECTOMY         Home Medications    Prior to Admission medications   Medication Sig Start Date End Date Taking? Authorizing Provider  amLODipine (NORVASC) 10 MG tablet TAKE 1 TABLET BY MOUTH EVERY DAY 06/12/19   Debbrah Alar, NP  cefUROXime (CEFTIN) 500 MG tablet Take 1 tablet (500 mg total) by mouth 2 (two) times daily with a meal. 05/01/19   Debbrah Alar, NP  diclofenac (VOLTAREN) 75 MG EC tablet Take 1 tablet (75 mg total) by mouth 2 (two) times daily. 04/25/19   Hudnall, Sharyn Lull, MD  Diclofenac Sodium (PENNSAID) 2 % SOLN Place 1 application onto the skin 2 (two) times daily. 07/12/19   Rosemarie Ax, MD  metoprolol succinate (TOPROL-XL) 50 MG 24 hr tablet TAKE 1 TABLET (50 MG TOTAL) BY MOUTH DAILY. TAKE WITH OR IMMEDIATELY FOLLOWING A MEAL. 07/25/19   Debbrah Alar, NP  mirabegron ER (MYRBETRIQ) 25 MG TB24 tablet Take 25 mg by mouth daily.    [provider]  nitroGLYCERIN (NITRODUR - DOSED IN MG/24 HR) 0.2 mg/hr patch Apply 1/4th patch to affected achilles, change daily 05/01/19   Hudnall, Sharyn Lull, MD  omeprazole (PRILOSEC) 20 MG capsule Take 1 capsule (20 mg total) by mouth  daily. 05/01/19   Debbrah Alar, NP    Family History Family History  Problem Relation Age of Onset  . Lupus Mother   . Vision loss Mother        in left eye  . Hypertension Father   . COPD Father   . Obesity Sister   . Thyroid disease Sister   . Multiple sclerosis Sister   . Cancer Neg Hx        prostate    Social History Social History   Tobacco Use  . Smoking status: Current Every Day Smoker    Types: Cigars  . Smokeless tobacco: Never Used  . Tobacco comment: 1 cigar a day  Substance Use Topics  . Alcohol use: Yes    Alcohol/week: 1.0 - 2.0 standard drinks    Types: 1 - 2 Cans of  beer per week  . Drug use: No     Allergies   Patient has no known allergies.   Review of Systems Review of Systems  Constitutional: Negative for chills, fatigue and fever.  Respiratory: Negative for cough and shortness of breath.   Cardiovascular: Negative for chest pain.  Gastrointestinal: Positive for abdominal pain. Negative for anorexia, constipation, diarrhea, flatus, hematemesis, hematochezia, melena, nausea and vomiting.  Genitourinary: Negative for dysuria and hematuria.  All other systems reviewed and are negative.    Physical Exam Triage Vital Signs ED Triage Vitals  Enc Vitals Group     BP 08/13/19 1453 137/86     Pulse Rate 08/13/19 1453 (!) 59     Resp --      Temp 08/13/19 1453 98.3 F (36.8 C)     Temp Source 08/13/19 1453 Oral     SpO2 08/13/19 1453 97 %     Weight 08/13/19 1501 217 lb (98.4 kg)     Height 08/13/19 1501 6\' 2"  (1.88 m)     Head Circumference --      Peak Flow --      Pain Score 08/13/19 1501 9     Pain Loc --      Pain Edu? --      Excl. in [place]? --    No data found.  Updated Vital Signs BP 137/86 (BP Location: Right Arm)   Pulse (!) 59   Temp 98.3 F (36.8 C) (Oral)   Ht 6\' 2"  (1.88 m)   Wt 98.4 kg   SpO2 97%   BMI 27.86 kg/m   Visual Acuity Right Eye Distance:   Left Eye Distance:   Bilateral Distance:    Right Eye Near:   Left Eye Near:    Bilateral Near:     Physical Exam Vitals signs and nursing note reviewed.  Constitutional:      General: He is not in acute distress. HENT:     Head: Normocephalic.     Nose: Nose normal.     Mouth/Throat:     Mouth: Mucous membranes are moist.  Eyes:     Pupils: Pupils are equal, round, and reactive to light.  Cardiovascular:     Heart sounds: Normal heart sounds.  Pulmonary:     Breath sounds: Normal breath sounds.  Abdominal:     General: Abdomen is flat. Bowel sounds are normal. There is no distension.     Palpations: Abdomen is soft. There is no hepatomegaly.      Tenderness: There is abdominal tenderness in the periumbilical area.       Comments: No definite left flank or  lower back pain.  Vague periumbilical tenderness to palpation as noted on diagram.   Musculoskeletal:     Right lower leg: No edema.     Left lower leg: No edema.  Lymphadenopathy:     Cervical: No cervical adenopathy.  Skin:    General: Skin is warm and dry.     Findings: No rash.  Neurological:     Mental Status: He is alert.      UC Treatments / Results  Labs (all labs ordered are listed, but only abnormal results are displayed) Labs Reviewed  POCT URINALYSIS DIP (MANUAL ENTRY) - Abnormal; Notable for the following components:      Result Value   Bilirubin, UA small (*)    Spec Grav, UA >=1.030 (*)    Protein Ur, POC trace (*)    All other components within normal limits  COMPLETE METABOLIC PANEL WITH GFR  SEDIMENTATION RATE  POCT CBC W AUTO DIFF ([facility]):  WBC 7.8; LY 25.4; MO 3.0; GR 71.6; Hgb 16.0; Platelets 344     EKG   Radiology No results found.  Procedures Procedures (including critical care time)  Medications Ordered in UC Medications - No data to display  Initial Impression / Assessment and Plan / UC Course  I have reviewed the triage vital signs and the nursing notes.  Pertinent labs & imaging results that were available during my care of the patient were reviewed by me and considered in my medical decision making (see chart for details).    Normal CBC and negative urinalysis reassuring. Etiology unclear.  CMP and sed rate pending. ?early shingles. Followup with Family Doctor if not improved in about 5 days.   Final Clinical Impressions(s) / UC Diagnoses   Final diagnoses:  Acute left flank pain  Lower abdominal pain     Discharge Instructions     May take Ibuprofen 200mg , 4 tabs every 8 hours with food, as needed. Call if rash develops.  If symptoms become significantly worse during the night or over the  weekend, proceed to the local emergency room.     ED Prescriptions    None        Kandra Nicolas, MD 08/15/19 1244

## 2019-08-13 NOTE — Discharge Instructions (Addendum)
May take Ibuprofen 200mg , 4 tabs every 8 hours with food, as needed. Call if rash develops.  If symptoms become significantly worse during the night or over the weekend, proceed to the local emergency room.

## 2019-08-13 NOTE — ED Triage Notes (Signed)
Pt c/o left sided flank pain x 3-4 days. Denies urinary sxs. No hx of kidney stones. Pain 9/10

## 2019-08-15 ENCOUNTER — Encounter (HOSPITAL_COMMUNITY): Payer: Self-pay | Admitting: Emergency Medicine

## 2019-08-15 ENCOUNTER — Ambulatory Visit: Payer: BC Managed Care – PPO | Admitting: Family

## 2019-08-15 ENCOUNTER — Telehealth: Payer: Self-pay

## 2019-08-15 ENCOUNTER — Emergency Department (HOSPITAL_COMMUNITY)
Admission: EM | Admit: 2019-08-15 | Discharge: 2019-08-16 | Disposition: A | Discharge status: Home / Self Care | Payer: BC Managed Care – PPO | Attending: Emergency Medicine | Admitting: Emergency Medicine

## 2019-08-15 ENCOUNTER — Other Ambulatory Visit: Payer: Self-pay

## 2019-08-15 ENCOUNTER — Ambulatory Visit (HOSPITAL_BASED_OUTPATIENT_CLINIC_OR_DEPARTMENT_OTHER)
Admission: RE | Admit: 2019-08-15 | Discharge: 2019-08-15 | Disposition: A | Discharge status: Home / Self Care | Payer: BC Managed Care – PPO | Source: Ambulatory Visit | Attending: Family | Admitting: Family

## 2019-08-15 ENCOUNTER — Telehealth: Payer: Self-pay | Admitting: *Deleted

## 2019-08-15 ENCOUNTER — Encounter: Payer: Self-pay | Admitting: Family

## 2019-08-15 DIAGNOSIS — R1032 Left lower quadrant pain: Secondary | ICD-10-CM

## 2019-08-15 DIAGNOSIS — I10 Essential (primary) hypertension: Secondary | ICD-10-CM | POA: Diagnosis not present

## 2019-08-15 DIAGNOSIS — K388 Other specified diseases of appendix: Secondary | ICD-10-CM | POA: Insufficient documentation

## 2019-08-15 DIAGNOSIS — Z79899 Other long term (current) drug therapy: Secondary | ICD-10-CM | POA: Insufficient documentation

## 2019-08-15 DIAGNOSIS — F17290 Nicotine dependence, other tobacco product, uncomplicated: Secondary | ICD-10-CM | POA: Diagnosis not present

## 2019-08-15 DIAGNOSIS — R109 Unspecified abdominal pain: Secondary | ICD-10-CM | POA: Diagnosis present

## 2019-08-15 DIAGNOSIS — K769 Liver disease, unspecified: Secondary | ICD-10-CM

## 2019-08-15 DIAGNOSIS — R1030 Lower abdominal pain, unspecified: Secondary | ICD-10-CM

## 2019-08-15 LAB — BASIC METABOLIC PANEL
Anion gap: 9 (ref 5–15)
BUN: 14 mg/dL (ref 6–20)
CO2: 24 mmol/L (ref 22–32)
Calcium: 9.4 mg/dL (ref 8.9–10.3)
Chloride: 108 mmol/L (ref 98–111)
Creatinine, Ser: 1.09 mg/dL (ref 0.61–1.24)
GFR calc Af Amer: >60 mL/min (ref >60)
GFR calc non Af Amer: >60 mL/min (ref >60)
Glucose, Bld: 93 mg/dL (ref 70–99)
Potassium: 4 mmol/L (ref 3.5–5.1)
Sodium: 141 mmol/L (ref 135–145)

## 2019-08-15 LAB — COMPLETE METABOLIC PANEL WITH GFR
AG Ratio: 1.7 (calc) (ref 1.0–2.5)
ALT: 27 U/L (ref 9–46)
AST: 23 U/L (ref 10–40)
Albumin: 4.5 g/dL (ref 3.6–5.1)
Alkaline phosphatase (APISO): 61 U/L (ref 36–130)
BUN: 14 mg/dL (ref 7–25)
CO2: 28 mmol/L (ref 20–32)
Calcium: 10.2 mg/dL (ref 8.6–10.3)
Chloride: 106 mmol/L (ref 98–110)
Creat: 1.13 mg/dL (ref 0.60–1.35)
GFR, Est African American: 89 mL/min/{1.73_m2} (ref >=60)
GFR, Est Non African American: 77 mL/min/{1.73_m2} (ref >=60)
Globulin: 2.6 g/dL (calc) (ref 1.9–3.7)
Glucose, Bld: 73 mg/dL (ref 65–99)
Potassium: 4.4 mmol/L (ref 3.5–5.3)
Sodium: 143 mmol/L (ref 135–146)
Total Bilirubin: 0.5 mg/dL (ref 0.2–1.2)
Total Protein: 7.1 g/dL (ref 6.1–8.1)

## 2019-08-15 LAB — CBC
HCT: 47.5 % (ref 39.0–52.0)
Hemoglobin: 15.7 g/dL (ref 13.0–17.0)
MCH: 30.4 pg (ref 26.0–34.0)
MCHC: 33.1 g/dL (ref 30.0–36.0)
MCV: 92.1 fL (ref 80.0–100.0)
Platelets: 293 10*3/uL (ref 150–400)
RBC: 5.16 MIL/uL (ref 4.22–5.81)
RDW: 13.4 % (ref 11.5–15.5)
WBC: 7.3 10*3/uL (ref 4.0–10.5)
nRBC: 0 % (ref 0.0–0.2)

## 2019-08-15 LAB — URINALYSIS, ROUTINE W REFLEX MICROSCOPIC
Bilirubin Urine: NEGATIVE
Glucose, UA: NEGATIVE mg/dL
Hgb urine dipstick: NEGATIVE
Ketones, ur: NEGATIVE mg/dL
Leukocytes,Ua: NEGATIVE
Nitrite: NEGATIVE
Protein, ur: NEGATIVE mg/dL
Specific Gravity, Urine: >1.046 — ABNORMAL HIGH (ref 1.005–1.030)
pH: 5 (ref 5.0–8.0)

## 2019-08-15 LAB — SEDIMENTATION RATE: Sed Rate: 2 mm/h (ref 0–15)

## 2019-08-15 MED ORDER — IOHEXOL 300 MG/ML  SOLN
100.0000 mL | Freq: Once | INTRAMUSCULAR | Status: AC | PRN
  Administered 2019-08-15: 17:00:00 100 mL via INTRAVENOUS

## 2019-08-15 NOTE — ED Notes (Signed)
Patient asked earlier if he could eat. Spoke with [place], [place] and he states patient can not eat until he talks to general surgery. Informed patient of this information.

## 2019-08-15 NOTE — Telephone Encounter (Signed)
Copied from [place] [phone]. Topic: Appointment Scheduling - Scheduling Inquiry for Clinic >> Aug 15, 2019  7:05 AM Rayann Heman wrote: Reason for CRM: pt called and stated that he would like to schedule an appointment for a urgent care follow up (left ankle pain)

## 2019-08-15 NOTE — Progress Notes (Signed)
Subjective:    Patient ID: Darin Smith, male    DOB: 02-26-1971, 48 y.o.   MRN: [id number]  HPI  Patient is a 48 yr old male who presents today for follow up from his urgent care visit on 08/13/19.  He presented that day with chief complaint of left flank pain.  A urinalysis was performed which noted trace protein. CBC, ESR,CMET all OK.    Pt reports that symptoms began Wednesday night of last week.  He was at the beach.  Pain started overnight.  Thursday during the day he felt it 1-2 times.  Friday they left the beach.  Pain woke him up Saturday AM. Pain is described as "someone hitting you in the kidney stone."  He has not developed a rash.  He reports that he has been moving his bowels daily.   He denies fever.  Rates today's pain 5/10. Pain was 10 the day that he went to the urgent care.  Pain does not seem to be worse with movement.  Denies dysuria/frequency. He reports that he is taking otc ibuprofen 471m Q8 hrs. Reports that this does help some with his pain.   He has not yet returned to work.    Review of Systems See HPI  Past Medical History:  Diagnosis Date  . Rupture spleen age 48  hx of abdominal trauma     Social History   Socioeconomic History  . Marital status: Married    Spouse name: Not on file  . Number of children: Not on file  . Years of education: Not on file  . Highest education level: Not on file  Occupational History  . Not on file  Social Needs  . Financial resource strain: Not on file  . Food insecurity    Worry: Not on file    Inability: Not on file  . Transportation needs    Medical: Not on file    Non-medical: Not on file  Tobacco Use  . Smoking status: Current Every Day Smoker    Types: Cigars  . Smokeless tobacco: Never Used  . Tobacco comment: 1 cigar a day  Substance and Sexual Activity  . Alcohol use: Yes    Alcohol/week: 1.0 - 2.0 standard drinks    Types: 1 - 2 Cans of beer per week  . Drug use: No  . Sexual activity: Not  on file  Lifestyle  . Physical activity    Days per week: Not on file    Minutes per session: Not on file  . Stress: Not on file  Relationships  . Social [place] phone: Not on file    Gets together: Not on file    Attends religious service: Not on file    Active member of club or organization: Not on file    Attends meetings of clubs or organizations: Not on file    Relationship status: Not on file  . Intimate partner violence    Fear of current or ex partner: Not on file    Emotionally abused: Not on file    Physically abused: Not on file    Forced sexual activity: Not on file  Other Topics Concern  . 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  Remarried 7/15  Past Surgical History:  Procedure Laterality Date  . BIOPSY PROSTATE  5/13   Dr. Lowella Bandy- negative  . SPLENECTOMY      Family History  Problem Relation Age of Onset  . Lupus Mother   . Vision loss Mother        in left eye  . Hypertension Father   . COPD Father   . Obesity Sister   . Thyroid disease Sister   . Multiple sclerosis Sister   . Cancer Neg Hx        prostate    No Known Allergies  Current Outpatient Medications on File Prior to Visit  Medication Sig Dispense Refill  . amLODipine (NORVASC) 10 MG tablet TAKE 1 TABLET BY MOUTH EVERY DAY 90 tablet 1  . diclofenac (VOLTAREN) 75 MG EC tablet Take 1 tablet (75 mg total) by mouth 2 (two) times daily. 60 tablet 1  . metoprolol succinate (TOPROL-XL) 50 MG 24 hr tablet TAKE 1 TABLET (50 MG TOTAL) BY MOUTH DAILY. TAKE WITH OR IMMEDIATELY FOLLOWING A MEAL. 90 tablet 0  . mirabegron ER (MYRBETRIQ) 25 MG TB24 tablet Take 25 mg by mouth daily.    Marland Kitchen omeprazole (PRILOSEC) 20 MG capsule Take 1 capsule (20 mg total) by mouth daily. 90 capsule 1   No current facility-administered medications  on file prior to visit.     BP (!) 153/92 (BP Location: Right Arm, Patient Position: Sitting, Cuff Size: Large)   Pulse 69   Temp 97.8 F (36.6 C) (Temporal)   Resp 16   Ht _0  (1.88 m)   Wt 218 lb (98.9 kg)   SpO2 (!) 69%   BMI 27.99 kg/m       Objective:   Physical Exam Constitutional:      General: He is not in acute distress.    Appearance: He is well-developed.  HENT:     Head: Normocephalic and atraumatic.  Cardiovascular:     Rate and Rhythm: Normal rate and regular rhythm.     Heart sounds: No murmur.  Pulmonary:     Effort: Pulmonary effort is normal. No respiratory distress.     Breath sounds: Normal breath sounds. No wheezing or rales.  Abdominal:     General: Bowel sounds are normal. There is no distension.     Tenderness: There is abdominal tenderness in the left lower quadrant. There is no guarding.  Skin:    General: Skin is warm and dry.  Neurological:     Mental Status: He is alert and oriented to person, place, and time.  Psychiatric:        Behavior: Behavior normal.        Thought Content: Thought content normal.           Assessment & Plan:  LLQ pain- Initial differential included diverticulitis, kidney stone.  However suprisingly he had an appendiceal mass.  Incidental finding of liver mass is also noted.  Will need follow up imaging in 3 months.   A CT abd/pelvis was performed with the following findings:  1. No definite acute abnormality detected. 2. There is a small nonobstructing stone in the lower pole the left kidney. There is no left-sided hydronephrosis. There is a tiny calcification in the left hemipelvis which is favored to be external to the ureter and urinary bladder and most likely represents a small phlebolith as opposed to a nonobstructing stone. 3. 6 cm cystic structure involving the distal appendix without significant periappendiceal inflammatory changes. There are  enlarged lymph nodes in the right lower quadrant.  Differential considerations include a mucocele of the appendix, versus less likely acute appendicitis in the absence of right lower quadrant pain, versus an underlying appendiceal tumor. Surgical consultation is recommended, urgency of which depends on the patient's symptoms and laboratory studies. The enlarged regional lymph nodes may be reactive or malignant. 4. Indeterminate 1 cm hypoattenuating region in hepatic segment 6 with some mild associated capsular retraction. Consider follow-up with ultrasound for further evaluation of this finding versus a three-month follow-up CT or MRI to confirm stability. This finding is indeterminate.  Patient was advised to proceed to the [facility] ED for further evaluation.

## 2019-08-15 NOTE — Patient Instructions (Signed)
Please complete lab work prior to leaving. Complete CT scan in imaging on the first floor. Check in with the ER secretary.

## 2019-08-15 NOTE — Telephone Encounter (Signed)
Spoke to pt given lab results. He states that he is feeling some better today.

## 2019-08-15 NOTE — ED Provider Notes (Signed)
[facility] DEPT Provider Note   CSN: [id number] Arrival date & time: 08/15/19  2004    History   Chief Complaint Chief Complaint  Patient presents with   Abdominal Pain    HPI Darin Smith is a 48 y.o. male.     HPI Patient presents to the emergency room for evaluation of abdominal pain.  Patient states that started about 6 days ago.  Patient was having a vague discomfort on the left side of his flank.  On the 16th he had a more intense discomfort that felt like a hard punched on that left flank area.  He denies any changes in his urination.  He has not had any trouble with appetite.  He has not had any fevers or vomiting.  He has been taking Aleve with some improvement.  He went to an urgent care on the 16th.  His urinalysis was was normal.  They wonder whether he could have early onset of shingles.  Patient was instructed to follow-up with his primary care doctor.  Patient went to see Dr. Elisabeth Cara today.  Patient had an outpatient CT scan and laboratory tests ordered.  CT scan came back showing a 6 cm cystic structure involving the distal appendix without any significant peri-appendiceal inflammatory changes.  There are also some enlarged lymph nodes in the right lower quadrant.  The differential included mucocele and less likely acute appendicitis.  Dr. Inda Castle was contacted about the test result.  She asked the patient to come to the ED for further evaluation. 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 Lowella Bandy- negative   [place] Medications    Prior to Admission medications   Medication Sig Start Date End Date Taking? Authorizing Provider  amLODipine (NORVASC) 10 MG tablet TAKE 1 TABLET BY MOUTH EVERY DAY 06/12/19  Yes Debbrah Alar, NP  cetirizine (ZYRTEC) 10 MG tablet Take 10 mg by mouth daily as needed for allergies.   Yes [provider]  diclofenac (VOLTAREN) 75 MG EC tablet Take 1 tablet (75 mg total) by mouth 2 (two) times daily. Patient taking differently: Take 75 mg by mouth 2 (two) times daily as needed for mild pain.  04/25/19  Yes Hudnall, Sharyn Lull, MD  metoprolol succinate (TOPROL-XL) 50 MG 24 hr tablet TAKE 1 TABLET (50 MG TOTAL) BY MOUTH DAILY. TAKE WITH OR IMMEDIATELY FOLLOWING A MEAL. 07/25/19  Yes Debbrah Alar, NP  mirabegron ER (MYRBETRIQ) 25 MG TB24 tablet Take 25 mg by mouth daily.   Yes [provider]  omeprazole (PRILOSEC) 20 MG capsule Take 1 capsule (20 mg total) by mouth daily. 05/01/19  Yes Debbrah Alar, NP    Family History Family History  Problem Relation Age of Onset   Lupus Mother    Vision loss Mother        in  left eye   Hypertension Father    COPD Father    Obesity Sister    Thyroid disease Sister    Multiple sclerosis Sister    Cancer Neg Hx        prostate    Social History Social History   Tobacco Use   Smoking status: Current Every Day Smoker    Types: Cigars   Smokeless tobacco: Never Used   Tobacco comment: 1 cigar a day  Substance Use Topics   Alcohol use: Yes    Alcohol/week: 1.0 - 2.0 standard drinks    Types: 1 - 2 Cans of beer per week   Drug use: No     Allergies   Patient has no known allergies.   Review of Systems Review of Systems  All other systems reviewed and are negative.    Physical Exam Updated  Vital Signs BP (!) 159/102    Pulse 68    Temp 99 F (37.2 C)    Resp 18    SpO2 99%   Physical Exam Vitals signs and nursing note reviewed.  Constitutional:      General: He is not in acute distress.    Appearance: He is well-developed.  HENT:     Head: Normocephalic and atraumatic.     Right Ear: External ear normal.     Left Ear: External ear normal.  Eyes:     General: No scleral icterus.       Right eye: No discharge.        Left eye: No discharge.     Conjunctiva/sclera: Conjunctivae normal.  Neck:     Musculoskeletal: Neck supple.     Trachea: No tracheal deviation.  Cardiovascular:     Rate and Rhythm: Normal rate and regular rhythm.  Pulmonary:     Effort: Pulmonary effort is normal. No respiratory distress.     Breath sounds: Normal breath sounds. No stridor. No wheezing or rales.  Abdominal:     General: Bowel sounds are normal. There is no distension.     Palpations: Abdomen is soft.     Tenderness: There is abdominal tenderness in the right lower quadrant, suprapubic area and left lower quadrant. There is no guarding or rebound.  Musculoskeletal:        General: No tenderness.  Skin:    General: Skin is warm and dry.     Findings: No rash.  Neurological:     Mental Status: He is alert.     Cranial Nerves: No cranial nerve deficit (no facial droop, extraocular movements intact, no slurred speech).     Sensory: No sensory deficit.     Motor: No abnormal muscle tone or seizure activity.     Coordination: Coordination normal.      ED Treatments / Results  Labs (all labs ordered are listed, but only abnormal results are displayed) Labs Reviewed  CBC  BASIC METABOLIC PANEL  URINALYSIS, ROUTINE W REFLEX MICROSCOPIC    EKG None  Radiology Ct Abdomen Pelvis W Contrast  Addendum Date: 08/15/2019   ADDENDUM REPORT: 08/15/2019 18:45 ADDENDUM: These results will be called to the ordering clinician or representative by the Radiologist Assistant, and  communication documented in the PACS or zVision Dashboard. Electronically Signed   By: Constance Holster M.D.   On: 08/15/2019 18:45   Result Date: 08/15/2019 CLINICAL DATA:  Abdominal pain with diverticulitis suspected. Left flank pain x5 days. EXAM: CT ABDOMEN AND PELVIS WITH CONTRAST TECHNIQUE: Multidetector CT imaging of the  abdomen and pelvis was performed using the standard protocol following bolus administration of intravenous contrast. CONTRAST:  128mL OMNIPAQUE IOHEXOL 300 MG/ML  SOLN COMPARISON:  None. FINDINGS: Lower chest: The lung bases are clear. The heart size is normal. Hepatobiliary: There is a small 1 cm hypoattenuating area in hepatic segment 6. There appears to be some capsular retraction in this area. Normal gallbladder.There is no biliary ductal dilation. Pancreas: Normal contours without ductal dilatation. No peripancreatic fluid collection. Spleen: No splenic laceration or hematoma. Adrenals/Urinary Tract: --Adrenal glands: No adrenal hemorrhage. --Right kidney/ureter: No hydronephrosis or perinephric hematoma. --Left kidney/ureter: There is a 2-3 mm nonobstructing stone in lower pole the left kidney. There is a small calcification at the inferior aspect of the urinary bladder on the left which is felt to be external to the ureter and bladder and likely represents a phlebolith. --Urinary bladder: Unremarkable. Stomach/Bowel: --Stomach/Duodenum: No hiatal hernia or other gastric abnormality. Normal duodenal course and caliber. --Small bowel: No dilatation or inflammation. --Colon: No focal abnormality. --Appendix: The proximal appendix is unremarkable. The distal appendix demonstrates significant cystic dilatation and measures approximately 6 x 3.7 cm. There are no significant periappendiceal inflammatory changes. Vascular/Lymphatic: Normal course and caliber of the major abdominal vessels. --there are no significant retroperitoneal lymph nodes. --there are enlarged lymph nodes in the right  lower quadrant measuring up to approximately 1 cm in the short axis. --No pelvic or inguinal lymphadenopathy. Reproductive: The prostate gland is enlarged. Other: No ascites or free air. The abdominal wall is normal. Musculoskeletal. No acute displaced fractures. IMPRESSION: 1. No definite acute abnormality detected. 2. There is a small nonobstructing stone in the lower pole the left kidney. There is no left-sided hydronephrosis. There is a tiny calcification in the left hemipelvis which is favored to be external to the ureter and urinary bladder and most likely represents a small phlebolith as opposed to a nonobstructing stone. 3. 6 cm cystic structure involving the distal appendix without significant periappendiceal inflammatory changes. There are enlarged lymph nodes in the right lower quadrant. Differential considerations include a mucocele of the appendix, versus less likely acute appendicitis in the absence of right lower quadrant pain, versus an underlying appendiceal tumor. Surgical consultation is recommended, urgency of which depends on the patient's symptoms and laboratory studies. The enlarged regional lymph nodes may be reactive or malignant. 4. Indeterminate 1 cm hypoattenuating region in hepatic segment 6 with some mild associated capsular retraction. Consider follow-up with ultrasound for further evaluation of this finding versus a three-month follow-up CT or MRI to confirm stability. This finding is indeterminate. Electronically Signed: By: Constance Holster M.D. On: 08/15/2019 18:02    Procedures Procedures (including critical care time)  Medications Ordered in ED Medications - No data to display   Initial Impression / Assessment and Plan / ED Course  I have reviewed the triage vital signs and the nursing notes.  Pertinent labs & imaging results that were available during my care of the patient were reviewed by me and considered in my medical decision making (see chart for  details).   CT scan report reviewed.  Patient does have abnormal findings around the appendix but there is no evidence of periappendiceal inflammation.  Patient symptoms are certainly not typical for appendicitis however he does have tenderness in the right lower abdomen.  CBC and metabolic panel have been ordered.  Plan on consultation with surgery after the test results return.  Final Clinical Impressions(s) / ED Diagnoses   Final diagnoses:  Lower abdominal  pain    ED Discharge Orders    None       Dorie Rank, MD 08/15/19 [id number]

## 2019-08-15 NOTE — ED Triage Notes (Signed)
Patient c/o abdominal pain x6 days. Denies N/V/D. Reports seen at PCP today and had CT scan completed. States sent for eval by PCP after scan resulted.

## 2019-08-15 NOTE — Telephone Encounter (Signed)
'  Darin Smith' from [facility] calling. States Dr. Nyoka Cowden wanted to ensure results of abd and pelvis CT were in Epic for viewing.   Noted available in Epic.  After hours call.

## 2019-08-16 ENCOUNTER — Telehealth: Payer: Self-pay | Admitting: *Deleted

## 2019-08-16 ENCOUNTER — Encounter: Payer: Self-pay | Admitting: Family

## 2019-08-16 LAB — COMPREHENSIVE METABOLIC PANEL
ALT: 26 U/L (ref 0–53)
AST: 21 U/L (ref 0–37)
Albumin: 4.4 g/dL (ref 3.5–5.2)
Alkaline Phosphatase: 62 U/L (ref 39–117)
BUN: 15 mg/dL (ref 6–23)
CO2: 29 mEq/L (ref 19–32)
Calcium: 9.6 mg/dL (ref 8.4–10.5)
Chloride: 104 mEq/L (ref 96–112)
Creatinine, Ser: 1.31 mg/dL (ref 0.40–1.50)
GFR: 70.71 mL/min (ref >60.00)
Glucose, Bld: 107 mg/dL — ABNORMAL HIGH (ref 70–99)
Potassium: 4.2 mEq/L (ref 3.5–5.1)
Sodium: 140 mEq/L (ref 135–145)
Total Bilirubin: 0.3 mg/dL (ref 0.2–1.2)
Total Protein: 7.5 g/dL (ref 6.0–8.3)

## 2019-08-16 LAB — CBC WITH DIFFERENTIAL/PLATELET
Basophils Absolute: 0.1 10*3/uL (ref 0.0–0.1)
Basophils Relative: 1.1 % (ref 0.0–3.0)
Eosinophils Absolute: 0.2 10*3/uL (ref 0.0–0.7)
Eosinophils Relative: 1.9 % (ref 0.0–5.0)
HCT: 46.7 % (ref 39.0–52.0)
Hemoglobin: 15.6 g/dL (ref 13.0–17.0)
Lymphocytes Relative: 26.6 % (ref 12.0–46.0)
Lymphs Abs: 2.1 10*3/uL (ref 0.7–4.0)
MCHC: 33.4 g/dL (ref 30.0–36.0)
MCV: 92.1 fl (ref 78.0–100.0)
Monocytes Absolute: 0.7 10*3/uL (ref 0.1–1.0)
Monocytes Relative: 9.1 % (ref 3.0–12.0)
Neutro Abs: 4.9 10*3/uL (ref 1.4–7.7)
Neutrophils Relative %: 61.3 % (ref 43.0–77.0)
Platelets: 315 10*3/uL (ref 150.0–400.0)
RBC: 5.07 Mil/uL (ref 4.22–5.81)
RDW: 13.9 % (ref 11.5–15.5)
WBC: 8.1 10*3/uL (ref 4.0–10.5)

## 2019-08-16 MED ORDER — OXYCODONE-ACETAMINOPHEN 5-325 MG PO TABS: 1.0000 | ORAL_TABLET | Freq: Three times a day (TID) | ORAL | 0 refills | Status: DC | PRN

## 2019-08-16 MED ORDER — MORPHINE SULFATE (PF) 4 MG/ML IV SOLN
4.0000 mg | Freq: Once | INTRAVENOUS | Status: AC
  Administered 2019-08-16: 02:00:00 4 mg via INTRAVENOUS
  Filled 2019-08-16: qty 1

## 2019-08-16 NOTE — ED Notes (Signed)
When giving patient he wanted an update and would like to speak with provider. Informed Brandon, PA and he is going to bedside.

## 2019-08-16 NOTE — Telephone Encounter (Signed)
Spoke with wife. She reports that pt is scheduled to see the surgeon Monday 8/24 which was the soonest available appointment.  Advised her to bring pt back to the ER if worsening symptoms in the meantime.  She verbalizes understanding. States pt is sleeping comfortably.  She is requesting a note for pt's work. Note was posted to his mychart account.

## 2019-08-16 NOTE — Telephone Encounter (Signed)
I spoke to the wife and she states she is all set, did not call back again.

## 2019-08-16 NOTE — Telephone Encounter (Signed)
Copied from [place] [id number]. Topic: General - Other >> Aug 16, 2019  9:21 AM Leward Quan A wrote: Reason for CRM: Patient wife called to request a call back from Debbrah Alar in reference to his ED visit on 08/15/2019 patient can be reached at Ph# [phone]

## 2019-08-16 NOTE — Discharge Instructions (Signed)
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  At this time there does not appear to be the presence of an emergent medical condition, however there is always the potential for conditions to change. Please read and follow the below instructions.  Please return to the Emergency Department immediately for any new or worsening symptoms. Please be sure to follow up with your Primary Care Provider within one week regarding your visit today; please call their office to schedule an appointment even if you are feeling better for a follow-up visit. Please call [facility] tomorrow morning to schedule a follow-up appointment regarding your appendix abnormality. Your CT scan today also showed a 1 cm hypoattenuation in your liver, discussed this with the surgeon tomorrow morning along with your primary care provider at your next visit. Your CT scan also showed a small calcification in your left hemipelvis which may be a small phlebolith versus kidney stone, discussed this with your surgeon along with your primary care provider. You may use the pain medication Percocet as prescribed for severe pain.  Do not drive or operate machinery while taking Percocet as will make you drowsy.  Do not drink alcohol or take other sedating medications with Percocet as this will worsen side effects.  Get help right away if: Your pain does not go away as soon as your doctor says it should. You cannot stop throwing up. Your pain is only in areas of your belly, such as the right side or the left lower part of the belly. You have bloody or black poop, or poop that looks like tar. You have very bad pain, cramping, or bloating in your belly. You have signs of not having enough fluid or water in your body (dehydration), such as: Dark pee, very little pee, or no pee. Cracked lips. Dry mouth. Sunken eyes. Sleepiness. Weakness. Any new/concerning or worsening symptoms  Please read the additional  information packets attached to your discharge summary.  Do not take your medicine if  develop an itchy rash, swelling in your mouth or lips, or difficulty breathing; call 911 and seek immediate emergency medical attention if this occurs.

## 2019-08-16 NOTE — ED Provider Notes (Signed)
Care handoff received from Dr. Tomi Bamberger at shift change please see his note for further details.  48 year old male presents today for 6 days of left side and flank pain.  He was evaluated by PCP who ordered a CT scan for concern of diverticulitis, scan happened to show 6 cm cystic structure of the distal appendix without significant periappendiceal inflammation.  Previous team exam showed some right lower quadrant tenderness without guarding or rebound along with left lower quadrant and suprapubic tenderness.  CT Abd/Pelvis: IMPRESSION: 1. No definite acute abnormality detected. 2. There is a small nonobstructing stone in the lower pole the left kidney. There is no left-sided hydronephrosis. There is a tiny calcification in the left hemipelvis which is favored to be external to the ureter and urinary bladder and most likely represents a small phlebolith as opposed to a nonobstructing stone. 3. 6 cm cystic structure involving the distal appendix without significant periappendiceal inflammatory changes. There are enlarged lymph nodes in the right lower quadrant. Differential considerations include a mucocele of the appendix, versus less likely acute appendicitis in the absence of right lower quadrant pain, versus an underlying appendiceal tumor. Surgical consultation is recommended, urgency of which depends on the patient's symptoms and laboratory studies. The enlarged regional lymph nodes may be reactive or malignant. 4. Indeterminate 1 cm hypoattenuating region in hepatic segment 6 with some mild associated capsular retraction. Consider follow-up with ultrasound for further evaluation of this finding versus a three-month follow-up CT or MRI to confirm stability. This finding is indeterminate.  Plan of care at shift change is to follow lab work, upon completion of basic labs consult to general surgery team for recommendations. Physical Exam  BP (!) 137/98 (BP Location: Right Arm)   Pulse 89    Temp 98.6 F (37 C) (Oral)   Resp 18   SpO2 97%   Physical Exam Constitutional:      General: He is not in acute distress.    Appearance: Normal appearance. He is well-developed. He is not ill-appearing or diaphoretic.  HENT:     Head: Normocephalic and atraumatic.     Right Ear: External ear normal.     Left Ear: External ear normal.     Nose: Nose normal.  Eyes:     General: Vision grossly intact. Gaze aligned appropriately.     Pupils: Pupils are equal, round, and reactive to light.  Neck:     Musculoskeletal: Normal range of motion.     Trachea: Trachea and phonation normal. No tracheal deviation.  Pulmonary:     Effort: Pulmonary effort is normal. No respiratory distress.  Abdominal:     General: There is no distension.  Musculoskeletal: Normal range of motion.  Skin:    General: Skin is warm and dry.  Neurological:     Mental Status: He is alert.     GCS: GCS eye subscore is 4. GCS verbal subscore is 5. GCS motor subscore is 6.     Comments: Speech is clear and goal oriented, follows commands Major Cranial nerves without deficit, no facial droop Moves extremities without ataxia, coordination intact  Psychiatric:        Behavior: Behavior normal.     ED Course/Procedures   Clinical Course as of Aug 15 237  Tue Aug 15, 2019  2211 Follow-up labs, consult general surgery.    [BM]  Wed Aug 16, 2019  0212 Dr. Lucia Gaskins: Call tomorrow morning to schedule outpatient appointment.   [BM]    Clinical Course User  Index [BM] Deliah Boston, PA-C    Procedures  MDM  CBC within normal limits BMP within normal limits Urinalysis nonacute Vital signs stable - Patient and wife updated on care plan and are agreeable to continue waiting for general surgery consultation. - Discussed case with on-call general surgeon Dr. Lucia Gaskins.  Dr. Lucia Gaskins advises patient call his office tomorrow morning to schedule outpatient appointment. - On reassessment patient sitting  comfortably in bed no acute distress.  Patient and his wife state understanding of Dr. Pollie Friar recommendations and are agreeable for discharge at this time.  Will discharge patient with short course of pain medication, discussed precautions regarding opiates with patient and his wife and they state understanding, wife driving home.  Referral given to [facility] surgery for follow-up, they are to call office first thing tomorrow morning and return to the ER immediately for any new or worsening symptoms.  At this time there does not appear to be any evidence of an acute emergency medical condition and the patient appears stable for discharge with appropriate outpatient follow up. Diagnosis was discussed with patient who verbalizes understanding of care plan and is agreeable to discharge. I have discussed return precautions with patient who verbalizes understanding of return precautions. Patient encouraged to follow-up with their PCP and general surgery. All questions answered.  Patient has been discharged in good condition.  Note: Portions of this report may have been transcribed using voice recognition software. Every effort was made to ensure accuracy; however, inadvertent computerized transcription errors may still be present.   Gari Crown 08/16/19 0252    Fatima Blank, MD 08/18/19 [phone]

## 2019-08-21 ENCOUNTER — Encounter: Payer: Self-pay | Admitting: Family

## 2019-08-23 ENCOUNTER — Telehealth: Payer: Self-pay | Admitting: Gastroenterology

## 2019-08-23 NOTE — Telephone Encounter (Signed)
Dr Ardis Hughs please review.  Records to be placed on your desk.

## 2019-08-28 NOTE — Telephone Encounter (Signed)
Darin Smith did you put those records on Dr Ardis Hughs desk for review?

## 2019-08-28 NOTE — Telephone Encounter (Signed)
I have not seen these records yet.

## 2019-08-29 NOTE — Telephone Encounter (Signed)
I cannot find them on my desk.

## 2019-08-29 NOTE — Telephone Encounter (Signed)
Okay, thank you I was able to see his H&P from Dr. Dema Severin from [facility] surgery.  It looks like he has an incidental 6 cm appendiceal cystic lesion noted recently.  Dr. Dema Severin is planning surgery and is hoping for a colonoscopy to be performed before then to make sure there are no intraluminal colon issues that he would need to know about.  That all seems perfectly reasonable.  Darin Smith, he needs a colonoscopy at his soonest convenience for abnormal CT scan of the abdomen. [place]

## 2019-08-29 NOTE — Telephone Encounter (Signed)
Adriane printed records from [employer] and sent to Dr. Ardis Hughs for review

## 2019-08-29 NOTE — Telephone Encounter (Signed)
Patty, yes I sent them back to be placed on Dr. Eugenia Pancoast desk for review

## 2019-08-29 NOTE — Telephone Encounter (Signed)
Christy see the note from Dr Ardis Hughs pt needs Colon in the [facility] and previsit

## 2019-08-29 NOTE — Telephone Encounter (Signed)
Dr Gordy Councilman put them on your desk.

## 2019-08-31 ENCOUNTER — Encounter: Payer: Self-pay | Admitting: Gastroenterology

## 2019-08-31 ENCOUNTER — Other Ambulatory Visit: Payer: Self-pay

## 2019-08-31 ENCOUNTER — Ambulatory Visit: Payer: BC Managed Care – PPO | Admitting: *Deleted

## 2019-08-31 VITALS — Temp 97.5°F | Ht 74.0 in | Wt 214.0 lb

## 2019-08-31 DIAGNOSIS — R9389 Abnormal findings on diagnostic imaging of other specified body structures: Secondary | ICD-10-CM

## 2019-08-31 MED ORDER — PEG 3350-KCL-NA BICARB-NACL 420 G PO SOLR: 4000.0000 mL | Freq: Once | ORAL | 0 refills | Status: AC

## 2019-08-31 NOTE — Progress Notes (Signed)
No egg or soy allergy known to patient  No issues with past sedation with any surgeries  or procedures, no intubation problems  No diet pills per patient No home 02 use per patient  No blood thinners per patient  Pt denies issues with constipation  No A fib or A flutter  EMMI  Information given. Wife present at the time of previsit

## 2019-09-11 ENCOUNTER — Telehealth: Payer: Self-pay | Admitting: Gastroenterology

## 2019-09-11 NOTE — Telephone Encounter (Signed)
Pt returned call and answered “No” to all questions.  °  °Pt made aware of that care partner may come to the lobby during the procedure but will need to provide their own mask. ° ° °

## 2019-09-11 NOTE — Telephone Encounter (Signed)
Do you now or have you had a fever in the last 14 days?         Do you have any respiratory symptoms of shortness of breath or cough now or in the last 14 days?        Do you have any family members or close contacts with diagnosed or suspected Covid-19 in the past 14 days?         Have you been tested for Covid-19 and found to be positive?        Pt made aware to check in on theth floor and that care partner may wait in the car or come up to the lobby during the procedure but will need to provide their own mask.

## 2019-09-12 ENCOUNTER — Other Ambulatory Visit: Payer: Self-pay

## 2019-09-12 ENCOUNTER — Encounter: Payer: Self-pay | Admitting: Gastroenterology

## 2019-09-12 ENCOUNTER — Ambulatory Visit (AMBULATORY_SURGERY_CENTER): Payer: BC Managed Care – PPO | Admitting: Gastroenterology

## 2019-09-12 VITALS — BP 115/80 | HR 61 | Temp 99.1°F | Resp 25 | Ht 74.0 in | Wt 218.0 lb

## 2019-09-12 DIAGNOSIS — K635 Polyp of colon: Secondary | ICD-10-CM | POA: Diagnosis not present

## 2019-09-12 DIAGNOSIS — D124 Benign neoplasm of descending colon: Secondary | ICD-10-CM

## 2019-09-12 DIAGNOSIS — R9389 Abnormal findings on diagnostic imaging of other specified body structures: Secondary | ICD-10-CM | POA: Diagnosis present

## 2019-09-12 DIAGNOSIS — K648 Other hemorrhoids: Secondary | ICD-10-CM

## 2019-09-12 HISTORY — PX: COLONOSCOPY: SHX174

## 2019-09-12 MED ORDER — SODIUM CHLORIDE 0.9 % IV SOLN: 500.0000 mL | Freq: Once | INTRAVENOUS | Status: DC

## 2019-09-12 NOTE — Patient Instructions (Signed)
Handouts given for polyps and hemorrhoids.  YOU HAD AN ENDOSCOPIC PROCEDURE TODAY AT THE [facility]:   Refer to the procedure report that was given to you for any specific questions about what was found during the examination.  If the procedure report does not answer your questions, please call your gastroenterologist to clarify.  If you requested that your care partner not be given the details of your procedure findings, then the procedure report has been included in a sealed envelope for you to review at your convenience later.  YOU SHOULD EXPECT: Some feelings of bloating in the abdomen. Passage of more gas than usual.  Walking can help get rid of the air that was put into your GI tract during the procedure and reduce the bloating. If you had a lower endoscopy (such as a colonoscopy or flexible sigmoidoscopy) you may notice spotting of blood in your stool or on the toilet paper. If you underwent a bowel prep for your procedure, you may not have a normal bowel movement for a few days.  Please Note:  You might notice some irritation and congestion in your nose or some drainage.  This is from the oxygen used during your procedure.  There is no need for concern and it should clear up in a day or so.  SYMPTOMS TO REPORT IMMEDIATELY:   Following lower endoscopy (colonoscopy or flexible sigmoidoscopy):  Excessive amounts of blood in the stool  Significant tenderness or worsening of abdominal pains  Swelling of the abdomen that is new, acute  Fever of 100F or higher  For urgent or emergent issues, a gastroenterologist can be reached at any hour by calling [phone].   DIET:  We do recommend a small meal at first, but then you may proceed to your regular diet.  Drink plenty of fluids but you should avoid alcoholic beverages for 24 hours.  ACTIVITY:  You should plan to take it easy for the rest of today and you should NOT DRIVE or use heavy machinery until tomorrow (because of the  sedation medicines used during the test).    FOLLOW UP: Our staff will call the number listed on your records 48-72 hours following your procedure to check on you and address any questions or concerns that you may have regarding the information given to you following your procedure. If we do not reach you, we will leave a message.  We will attempt to reach you two times.  During this call, we will ask if you have developed any symptoms of COVID 19. If you develop any symptoms (ie: fever, flu-like symptoms, shortness of breath, cough etc.) before then, please call [phone].  If you test positive for Covid 19 in the 2 weeks post procedure, please call and report this information to us.    If any biopsies were taken you will be contacted by phone or by letter within the next 1-3 weeks.  Please call us at [phone] if you have not heard about the biopsies in 3 weeks.    SIGNATURES/CONFIDENTIALITY: You and/or your care partner have signed paperwork which will be entered into your electronic medical record.  These signatures attest to the fact that that the information above on your After Visit Summary has been reviewed and is understood.  Full responsibility of the confidentiality of this discharge information lies with you and/or your care-partner. 

## 2019-09-12 NOTE — Progress Notes (Signed)
Called to room to assist during endoscopic procedure.  Patient ID and intended procedure confirmed with present staff. Received instructions for my participation in the procedure from the performing physician.  

## 2019-09-12 NOTE — Progress Notes (Signed)
Report given to PACU, vss 

## 2019-09-12 NOTE — Progress Notes (Signed)
Temp check by AM/vital check by CW.  No changes in health or surgical history since pre-visit screening.

## 2019-09-12 NOTE — Op Note (Signed)
[facility] Patient Name: Darin Smith Procedure Date: 09/12/2019 10:02 AM MRN: [id number] Endoscopist: Milus Banister , MD Age: 48 Referring MD:  Date of Birth: November 28, 1971 Gender: Male Account #: [id number] Procedure:                Colonoscopy Indications:              Abnormal CT of the GI tract; cystic lesion                            involving the appendix; colonoscopy 2011 single                            subCM hyperplastic polyp Medicines:                Monitored Anesthesia Care Procedure:                Pre-Anesthesia Assessment:                           - Prior to the procedure, a History and Physical                            was performed, and patient medications and                            allergies were reviewed. The patient's tolerance of                            previous anesthesia was also reviewed. The risks                            and benefits of the procedure and the sedation                            options and risks were discussed with the patient.                            All questions were answered, and informed consent                            was obtained. Prior Anticoagulants: The patient has                            taken no previous anticoagulant or antiplatelet                            agents. ASA Grade Assessment: II - A patient with                            mild systemic disease. After reviewing the risks                            and benefits, the patient was deemed in  satisfactory condition to undergo the procedure.                           After obtaining informed consent, the colonoscope                            was passed under direct vision. Throughout the                            procedure, the patient's blood pressure, pulse, and                            oxygen saturations were monitored continuously. The                            Colonoscope was introduced through the anus  and                            advanced to the the cecum, identified by                            appendiceal orifice and ileocecal valve. The                            colonoscopy was performed without difficulty. The                            patient tolerated the procedure well. The quality                            of the bowel preparation was good. The ileocecal                            valve, appendiceal orifice, and rectum were                            photographed. Scope In: 10:09:57 AM Scope Out: 10:20:07 AM Scope Withdrawal Time: 0 hours 7 minutes 53 seconds  Total Procedure Duration: 0 hours 10 minutes 10 seconds  Findings:                 Smooth, 1cm bulging inward of the mucosa at the                            appendiceal orifice, see images.                           A 2 mm polyp was found in the descending colon. The                            polyp was sessile. The polyp was removed with a                            cold snare. Resection and retrieval were complete.  Internal hemorrhoids were found. The hemorrhoids                            were small.                           The exam was otherwise without abnormality on                            direct and retroflexion views. Complications:            No immediate complications. Estimated blood loss:                            None. Estimated Blood Loss:     Estimated blood loss: none. Impression:               - One 2 mm polyp in the descending colon, removed                            with a cold snare. Resected and retrieved.                           - Internal hemorrhoids.                           - Smooth bulging inward of the mucosa at the                            appendiceal orifice. This is likely related to the                            known appendiceal cystic lesion noted on recent CT.                            It was not sampled.                           - The  examination was otherwise normal on direct                            and retroflexion views. Recommendation:           - Patient has a contact number available for                            emergencies. The signs and symptoms of potential                            delayed complications were discussed with the                            patient. Return to normal activities tomorrow.                            Written discharge instructions were provided to the  patient.                           - Resume previous diet.                           - Continue present medications.                           - Repeat colonoscopy in 10 years for screening. Milus Banister, MD 09/12/2019 10:25:45 AM This report has been signed electronically.

## 2019-09-14 ENCOUNTER — Telehealth: Payer: Self-pay

## 2019-09-14 NOTE — Telephone Encounter (Signed)
  Follow up Call-  Call back number 09/12/2019  Post procedure Call Back phone  # [phone]  Permission to leave phone message Yes  Some recent data might be hidden     Patient questions:  Do you have a fever, pain , or abdominal swelling? No. Pain Score  0 *  Have you tolerated food without any problems? Yes.    Have you been able to return to your normal activities? Yes.    Do you have any questions about your discharge instructions: Diet   No. Medications  No. Follow up visit  No.  Do you have questions or concerns about your Care? No.  Actions: * If pain score is 4 or above: No action needed, pain <4. 1. Have you developed a fever since your procedure? no  2.   Have you had an respiratory symptoms (SOB or cough) since your procedure? no  3.   Have you tested positive for COVID 19 since your procedure no  4.   Have you had any family members/close contacts diagnosed with the COVID 19 since your procedure?  no   If yes to any of these questions please route to Joylene John, RN and Alphonsa Gin, Therapist, sports.

## 2019-09-15 ENCOUNTER — Encounter: Payer: Self-pay | Admitting: Gastroenterology

## 2019-09-28 DIAGNOSIS — D373 Neoplasm of uncertain behavior of appendix: Secondary | ICD-10-CM

## 2019-09-28 HISTORY — DX: Neoplasm of uncertain behavior of appendix: D37.3

## 2019-10-06 ENCOUNTER — Encounter (HOSPITAL_COMMUNITY): Payer: Self-pay

## 2019-10-06 NOTE — Patient Instructions (Addendum)
DUE TO COVID-19 ONLY ONE VISITOR IS ALLOWED TO COME WITH YOU AND STAY IN THE WAITING ROOM ONLY DURING PRE OP AND PROCEDURE. THE ONE VISITOR MAY VISIT WITH YOU IN YOUR PRIVATE ROOM DURING VISITING HOURS ONLY!!   COVID SWAB TESTING COMPLETED ON:  Saturday, Oct. 10, 2020    (Must self quarantine after testing. Follow instructions on handout.)             Your procedure is scheduled on: Wednesday, Oct. 14, 2020   Report to [facility] Main  Entrance    Report to admitting at 10:00 AM   Call this number if you have problems the morning of surgery [phone]   Do not eat food or drink liquids :DRINK 2 PRESURGERY ENSURE DRINKS THE NIGHT BEFORE SURGERY AT  1000 PM AND 1 PRESURGERY DRINK THE DAY OF THE PROCEDURE 3 HOURS PRIOR TO SCHEDULED SURGERY.   NO SOLIDS AFTER MIDNIGHT THE DAY PRIOR TO THE SURGERY. NOTHING BY MOUTH EXCEPT CLEAR LIQUIDS UNTIL THREE HOURS PRIOR TO SCHEDULED SURGERY. PLEASE FINISH PRESURGERY ENSURE DRINK PER SURGEON ORDER 3 HOURS PRIOR TO SCHEDULED SURGERY TIME WHICH NEEDS TO BE COMPLETED AT 9:00 AM.      CLEAR LIQUID DIET   Foods Allowed                                                                     Foods Excluded  Coffee and tea, regular and decaf                             liquids that you cannot  Plain Jell-O any favor except red or purple                                           see through such as: Fruit ices (not with fruit pulp)                                     milk, soups, orange juice  Iced Popsicles                                    All solid food Carbonated beverages, regular and diet                                    Cranberry, grape and apple juices Sports drinks like Gatorade Lightly seasoned clear broth or consume(fat free) Sugar, honey syrup  Sample Menu Breakfast                                Lunch                                     Supper Cranberry juice  Beef broth                            Chicken  broth Jell-O                                     Grape juice                           Apple juice Coffee or tea                        Jell-O                                      Popsicle                                                Coffee or tea                        Coffee or tea  _____________________________________________________________________   Brush your teeth the morning of surgery.   Do NOT smoke after Midnight   Take these medicines the morning of surgery with A SIP OF WATER: Amlodipine, Cetirizine, Metoprolol, Myrbetriq, Omeprazole                               You may not have any metal on your body including jewelry, and body piercings              Do not wear lotions, powders,cologne, or deodorant                          Men may shave face and neck.   Do not bring valuables to the hospital. [place].   Contacts, dentures or bridgework may not be worn into surgery.   Bring small overnight bag day of surgery. And follow your prep per your surgeon's instructions.    Special Instructions: Bring a copy of your healthcare power of attorney and living will documents the day of surgery if you haven't scanned them in before. Please follow the prep, per your surgeon's instructions              Please read over the following fact sheets you were given:  [facility] - Preparing for Surgery Before surgery, you can play an important role.  Because skin is not sterile, your skin needs to be as free of germs as possible.  You can reduce the number of germs on your skin by washing with CHG (chlorahexidine gluconate) soap before surgery.  CHG is an antiseptic cleaner which kills germs and bonds with the skin to continue killing germs even after washing. Please DO NOT use if you have an allergy to CHG or antibacterial soaps.  If your skin becomes reddened/irritated stop using the CHG and inform your nurse when you arrive at Short  Stay. Do  not shave (including legs and underarms) for at least 48 hours prior to the first CHG shower.  You may shave your face/neck.  Please follow these instructions carefully:  1.  Shower with CHG Soap the night before surgery and the  morning of surgery.  2.  If you choose to wash your hair, wash your hair first as usual with your normal  shampoo.  3.  After you shampoo, rinse your hair and body thoroughly to remove the shampoo.                             4.  Use CHG as you would any other liquid soap.  You can apply chg directly to the skin and wash.  Gently with a scrungie or clean washcloth.  5.  Apply the CHG Soap to your body ONLY FROM THE NECK DOWN.   Do   not use on face/ open                           Wound or open sores. Avoid contact with eyes, ears mouth and   genitals (private parts).                       Wash face,  Genitals (private parts) with your normal soap.             6.  Wash thoroughly, paying special attention to the area where your    surgery  will be performed.  7.  Thoroughly rinse your body with warm water from the neck down.  8.  DO NOT shower/wash with your normal soap after using and rinsing off the CHG Soap.                9.  Pat yourself dry with a clean towel.            10.  Wear clean pajamas.            11.  Place clean sheets on your bed the night of your first shower and do not  sleep with pets. Day of Surgery : Do not apply any lotions/deodorants the morning of surgery.  Please wear clean clothes to the hospital/surgery center.  FAILURE TO FOLLOW THESE INSTRUCTIONS MAY RESULT IN THE CANCELLATION OF YOUR SURGERY  PATIENT SIGNATURE_________________________________  NURSE SIGNATURE__________________________________  ________________________________________________________________________

## 2019-10-07 ENCOUNTER — Other Ambulatory Visit (HOSPITAL_COMMUNITY)
Admission: RE | Admit: 2019-10-07 | Discharge: 2019-10-07 | Disposition: A | Discharge status: Home / Self Care | Payer: BC Managed Care – PPO | Source: Ambulatory Visit | Attending: Surgery | Admitting: Surgery

## 2019-10-07 DIAGNOSIS — Z20828 Contact with and (suspected) exposure to other viral communicable diseases: Secondary | ICD-10-CM | POA: Diagnosis not present

## 2019-10-07 DIAGNOSIS — Z01812 Encounter for preprocedural laboratory examination: Secondary | ICD-10-CM | POA: Diagnosis not present

## 2019-10-08 LAB — NOVEL CORONAVIRUS, NAA (HOSP ORDER, SEND-OUT TO REF LAB; TAT 18-24 HRS): SARS-CoV-2, NAA: NOT DETECTED

## 2019-10-09 ENCOUNTER — Other Ambulatory Visit: Payer: Self-pay

## 2019-10-09 ENCOUNTER — Encounter (HOSPITAL_COMMUNITY)
Admission: RE | Admit: 2019-10-09 | Discharge: 2019-10-09 | Disposition: A | Discharge status: Home / Self Care | Payer: BC Managed Care – PPO | Source: Ambulatory Visit | Attending: Surgery | Admitting: Surgery

## 2019-10-09 ENCOUNTER — Encounter (HOSPITAL_COMMUNITY): Payer: Self-pay

## 2019-10-09 ENCOUNTER — Ambulatory Visit: Payer: Self-pay | Admitting: Surgery

## 2019-10-09 DIAGNOSIS — Z01818 Encounter for other preprocedural examination: Secondary | ICD-10-CM | POA: Diagnosis not present

## 2019-10-09 HISTORY — DX: Other seasonal allergic rhinitis: J30.2

## 2019-10-09 HISTORY — DX: Personal history of colonic polyps: Z86.010

## 2019-10-09 HISTORY — DX: Diverticulitis of intestine, part unspecified, without perforation or abscess without bleeding: K57.92

## 2019-10-09 HISTORY — DX: Overactive bladder: N32.81

## 2019-10-09 HISTORY — DX: Personal history of colon polyps, unspecified: Z86.0100

## 2019-10-09 LAB — DIFFERENTIAL
Abs Immature Granulocytes: 0.01 10*3/uL (ref 0.00–0.07)
Basophils Absolute: 0 10*3/uL (ref 0.0–0.1)
Basophils Relative: 0 %
Eosinophils Absolute: 0 10*3/uL (ref 0.0–0.5)
Eosinophils Relative: 0 %
Immature Granulocytes: 0 %
Lymphocytes Relative: 31 %
Lymphs Abs: 1.8 10*3/uL (ref 0.7–4.0)
Monocytes Absolute: 0.5 10*3/uL (ref 0.1–1.0)
Monocytes Relative: 8 %
Neutro Abs: 3.4 10*3/uL (ref 1.7–7.7)
Neutrophils Relative %: 61 %

## 2019-10-09 LAB — BASIC METABOLIC PANEL
Anion gap: 7 (ref 5–15)
BUN: 16 mg/dL (ref 6–20)
CO2: 26 mmol/L (ref 22–32)
Calcium: 9.4 mg/dL (ref 8.9–10.3)
Chloride: 106 mmol/L (ref 98–111)
Creatinine, Ser: 1.14 mg/dL (ref 0.61–1.24)
GFR calc Af Amer: >60 mL/min (ref >60)
GFR calc non Af Amer: >60 mL/min (ref >60)
Glucose, Bld: 97 mg/dL (ref 70–99)
Potassium: 4.6 mmol/L (ref 3.5–5.1)
Sodium: 139 mmol/L (ref 135–145)

## 2019-10-09 LAB — HEPATIC FUNCTION PANEL
ALT: 26 U/L (ref 0–44)
AST: 27 U/L (ref 15–41)
Albumin: 4.2 g/dL (ref 3.5–5.0)
Alkaline Phosphatase: 49 U/L (ref 38–126)
Bilirubin, Direct: 0.1 mg/dL (ref 0.0–0.2)
Indirect Bilirubin: 1.1 mg/dL — ABNORMAL HIGH (ref 0.3–0.9)
Total Bilirubin: 1.2 mg/dL (ref 0.3–1.2)
Total Protein: 7.5 g/dL (ref 6.5–8.1)

## 2019-10-09 LAB — CBC
HCT: 49.5 % (ref 39.0–52.0)
Hemoglobin: 16 g/dL (ref 13.0–17.0)
MCH: 30.3 pg (ref 26.0–34.0)
MCHC: 32.3 g/dL (ref 30.0–36.0)
MCV: 93.8 fL (ref 80.0–100.0)
Platelets: 279 10*3/uL (ref 150–400)
RBC: 5.28 MIL/uL (ref 4.22–5.81)
RDW: 13 % (ref 11.5–15.5)
WBC: 6 10*3/uL (ref 4.0–10.5)
nRBC: 0 % (ref 0.0–0.2)

## 2019-10-09 LAB — HEMOGLOBIN A1C
Hgb A1c MFr Bld: 5.6 % (ref 4.8–5.6)
Mean Plasma Glucose: 114.02 mg/dL

## 2019-10-09 NOTE — Progress Notes (Addendum)
Pt seen at 0800 on 10/09/19. Orders placed by surgeon's office at 8:26 AM, after patient had left PAT. Spoke to lab to see what labs can be added on, per surgeon's orders. Per Lab, CBC w/Diff , CMP, and Hemoglobin A1C could be added based on existing labs that were part of the anesthesia protocol. However, PT/INR, PTT or T&S have to be done the day of surgery.   ERAS protocol also noted. Spoke to [place] in Triage at Dr. Orest Dikes office to explain the patient was seen prior to orders placed. As a result, ERAS drinks were not given. Asked  Sharyn Lull to verify with Dr. Dema Severin if ERAS supplements were  Necessary.  If so, patient needs to be contacted to return for the ERAS supplements. Per Michelle's response on 10-10-19 ERAS drink needed. Patient contacted. Ensure supplements given along with updated instructions.

## 2019-10-09 NOTE — H&P (Signed)
CC: Referred by ED for f/u of appendiceal cystic structure  HPI: [name] is a very pleasant 68yoM with hx of kidney stones who had left flank pain 08/15/2019. He was evaluated by Darin Livings, NP, who ordered a CT scan of the abdomen and pelvis with contrast. No definite acute abnormalities. He was found to have nonobstructing stones in the left kidney without hydronephrosis. He also is found to have a calcification in the left hemipelvis possibly phlebolith or nonobstructing stone. He was found to have a 6 cm cystic structure involving the distal appendix without periappendiceal inflammatory changes. There are also enlarged lymph nodes in the right lower quadrant. Differential includes mucocele versus less likely acute appendicitis in the absence of right lower quadrant pain however less likely. Indeterminate 1 severe hypoattenuating segment 6 region which was indeterminate. He is subsequently referred to [place] for follow-up. He also has follow-up scheduled with Darin Smith, Darin Smith. He continues to have left flank discomfort. He denies any fevers/chills/nausea/vomiting. He has been moving his bowels normally. He denies any right-sided abdominal pain including in the right lower quadrant.  Last colonoscopy was in 2011 with Dr. Ardis Smith. Small polyp was removed  INTERVAL HX He underwent colonoscopy with Dr. Ardis Smith 09/12/2019 which demonstrated a smooth, 1 severe bulging inward of the mucosa at the appendiceal orifice. A single 2 mm polyp was found the descending colon and removed. Internal hemorrhoids.  Path returned hyperplastic polyp on the descending colon. His case is also reviewed through our multidisciplinary tumor board and the consensus was given the enlarged lymph nodes, considering right hemicolectomy. He has been doing well. He denies any complaints today. He has been working [place].  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 Works with [employer]  ROS: A comprehensive 10 system review of systems was completed with the patient and pertinent findings as noted above.  The patient is a 48 year old male.   Allergies Darin Smith, [place]; 09/25/2019 1:59 PM) No Known Drug Allergies  [08/21/2019]: Allergies Reconciled   Medication History Darin Smith, CMA; 09/25/2019 2:00 PM) amLODIPine Besylate (10MG  Tablet, Oral) Active. Myrbetriq (25MG  Tablet ER 24HR, Oral) Active. Omeprazole (20MG  Capsule DR, Oral) Active. Metoprolol Succinate ER (50MG  Tablet ER 24HR, Oral) Active. Medications Reconciled    Review of Systems Darin Gave M. Laverle Pillard MD; 09/25/2019 2:23 PM) General Not Present- Appetite Loss, Chills, Fatigue, Fever, Night Sweats, Weight Gain and Weight Loss. Skin Not Present- Change in Wart/Mole, Dryness, Hives, Jaundice, New Lesions, Non-Healing Wounds, Rash and Ulcer. HEENT Present- Seasonal Allergies. Not Present- Earache, Hearing Loss, Hoarseness, Nose Bleed, Oral Ulcers, Ringing in the Ears, Sinus Pain, Sore Throat, Visual Disturbances, Wears glasses/contact lenses and Yellow Eyes. Respiratory Present- Snoring. Not Present- Bloody sputum, Chronic Cough, Difficulty Breathing and Wheezing. Breast Not Present- Breast Mass, Breast Pain, Nipple Discharge and Skin Changes. Cardiovascular Not Present- Chest Pain, Difficulty Breathing Lying Down, Leg Cramps, Palpitations, Rapid Heart Rate, Shortness of Breath and Swelling of Extremities. Gastrointestinal Not Present- Abdominal Pain, Bloating, Bloody Stool, Change in Bowel Habits, Chronic diarrhea, Constipation, Difficulty Swallowing, Excessive gas, Gets full quickly at meals, Hemorrhoids, Indigestion, Nausea, Rectal Pain and Vomiting. Male Genitourinary Not Present- Blood in Urine, Change in Urinary Stream, Frequency, Impotence, Nocturia, Painful Urination, Urgency and Urine Leakage. Musculoskeletal Not  Present- Back Pain, Joint Pain, Joint Stiffness, Muscle Pain, Muscle Weakness and Swelling of Extremities. Neurological Not Present- Decreased Memory, Fainting, Headaches, Numbness,  Seizures, Tingling, Tremor, Trouble walking and Weakness. Psychiatric Not Present- Anxiety, Bipolar, Change in Sleep Pattern, Depression, Fearful and Frequent crying. Endocrine Not Present- Cold Intolerance, Excessive Hunger, Hair Changes, Heat Intolerance and New Diabetes. Hematology Not Present- Blood Thinners, Easy Bruising, Excessive bleeding, Gland problems, HIV and Persistent Infections.  Vitals Darin Smith CMA; 09/25/2019 1:59 PM) 09/25/2019 1:59 PM Weight: 220.8 lb Height: 74in Body Surface Area: 2.27 m Body Mass Index: 28.35 kg/m  Temp.: 97.24F  Pulse: 71 (Regular)  BP: 124/82(Sitting, Left Arm, Standard)       Physical Exam Darin Gave M. Darion Juhasz MD; 09/25/2019 2:23 PM) The physical exam findings are as follows: Note: Constitutional: No acute distress; conversant; no deformities Eyes: Moist conjunctiva; no lid lag; anicteric sclerae; pupils equal round and reactive to light Neck: Trachea midline; no palpable thyromegaly Lungs: Normal respiratory effort; no tactile fremitus CV: RRR; no palpable thrill; no pitting edema GI: Midline scar consistent with stated surgical history. Abdomen soft, nontender, nondistended; no palpable hepatosplenomegaly. MSK: Normal gait; no clubbing/cyanosis Psychiatric: Appropriate affect; alert and oriented 3 Lymphatic: No palpable cervical or axillary lymphadenopathy    Assessment & Plan Darin Gave M. 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 is a very pleasant 59yoM with incidentally found 6 cm appendiceal cystic lesion on workup for left flank pain. Impression: -Given prominent lymph nodes in right colon mesentery in setting of possible appendiceal tumor, we discussed proceeding with laparoscopic possible open  right hemicolectomy. -The anatomy and physiology of the GI tract was discussed at length with the patient and his wife again today. The pathophysiology of appendiceal lesions was reviewed at length with associated pictures as well. -The planned procedure, material risks (including, but not limited to, pain, bleeding, infection, scarring, need for blood transfusion, damage to surrounding structures- blood vessels/nerves/viscus/organs, damage to ureter, urine leak, leak from anastomosis, need for additional procedures, possibility of stoma, hernia, recurrence, positive margins/need for further surgery, worsening of pre-existing medical conditions; persistence of abdominal symptoms, pneumonia, heart attack, stroke, death) benefits and alternatives to surgery were discussed at length. We reviewed the rational for surgery. The patient's and his wife's questions were answered to their satisfaction, they voiced understanding and elected to proceed. Additionally, we discussed typical postoperative expectations and the recovery process. 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

## 2019-10-09 NOTE — Progress Notes (Signed)
PCP - Debbrah Alar Cardiologist -   Chest x-ray -  EKG -  10-09-19 Stress Test -  ECHO -  Cardiac Cath -   Sleep Study -  CPAP -   Fasting Blood Sugar -  Checks Blood Sugar _____ times a day  Blood Thinner Instructions: Aspirin Instructions: Last Dose:  Anesthesia review:   Patient denies shortness of breath, fever, cough and chest pain at PAT appointment   Patient verbalized understanding of instructions that were given to them at the PAT appointment. Patient was also instructed that they will need to review over the PAT instructions again at home before surgery.

## 2019-10-10 MED ORDER — BUPIVACAINE LIPOSOME 1.3 % IJ SUSP
20.0000 mL | Freq: Once | INTRAMUSCULAR | Status: DC
  Filled 2019-10-10: qty 20

## 2019-10-10 NOTE — Progress Notes (Signed)
PCP - Jeri Lager' Conley Canal Cardiologist -   Chest x-ray -  EKG -  Stress Test -  ECHO -  Cardiac Cath -   Sleep Study -  CPAP -   Fasting Blood Sugar -  Checks Blood Sugar _____ times a day  Blood Thinner Instructions: Aspirin Instructions: Last Dose:  Anesthesia review:   Patient denies shortness of breath, fever, cough and chest pain at PAT appointment   Patient verbalized understanding of instructions that were given to them at the PAT appointment. Patient was also instructed that they will need to review over the PAT instructions again at home before surgery.

## 2019-10-11 ENCOUNTER — Encounter (HOSPITAL_COMMUNITY): Payer: Self-pay

## 2019-10-11 ENCOUNTER — Inpatient Hospital Stay (HOSPITAL_COMMUNITY): Payer: BC Managed Care – PPO | Admitting: Physician Assistant

## 2019-10-11 ENCOUNTER — Other Ambulatory Visit: Payer: Self-pay

## 2019-10-11 ENCOUNTER — Encounter (HOSPITAL_COMMUNITY)
Admission: RE | Disposition: A | Discharge status: Home / Self Care | Payer: Self-pay | Source: Home / Self Care | Attending: Surgery

## 2019-10-11 ENCOUNTER — Inpatient Hospital Stay (HOSPITAL_COMMUNITY): Payer: BC Managed Care – PPO | Admitting: Registered Nurse

## 2019-10-11 ENCOUNTER — Inpatient Hospital Stay (HOSPITAL_COMMUNITY)
Admission: RE | Admit: 2019-10-11 | Discharge: 2019-10-14 | DRG: 331 | Disposition: A | Discharge status: Home / Self Care | Payer: BC Managed Care – PPO | Attending: Surgery | Admitting: Surgery

## 2019-10-11 DIAGNOSIS — F17290 Nicotine dependence, other tobacco product, uncomplicated: Secondary | ICD-10-CM | POA: Diagnosis present

## 2019-10-11 DIAGNOSIS — R599 Enlarged lymph nodes, unspecified: Secondary | ICD-10-CM | POA: Diagnosis present

## 2019-10-11 DIAGNOSIS — Z8349 Family history of other endocrine, nutritional and metabolic diseases: Secondary | ICD-10-CM

## 2019-10-11 DIAGNOSIS — Z832 Family history of diseases of the blood and blood-forming organs and certain disorders involving the immune mechanism: Secondary | ICD-10-CM

## 2019-10-11 DIAGNOSIS — Z8249 Family history of ischemic heart disease and other diseases of the circulatory system: Secondary | ICD-10-CM | POA: Diagnosis not present

## 2019-10-11 DIAGNOSIS — K660 Peritoneal adhesions (postprocedural) (postinfection): Secondary | ICD-10-CM | POA: Diagnosis present

## 2019-10-11 DIAGNOSIS — K648 Other hemorrhoids: Secondary | ICD-10-CM | POA: Diagnosis present

## 2019-10-11 DIAGNOSIS — Z825 Family history of asthma and other chronic lower respiratory diseases: Secondary | ICD-10-CM | POA: Diagnosis not present

## 2019-10-11 DIAGNOSIS — Z820 Family history of epilepsy and other diseases of the nervous system: Secondary | ICD-10-CM

## 2019-10-11 DIAGNOSIS — Z9081 Acquired absence of spleen: Secondary | ICD-10-CM | POA: Diagnosis not present

## 2019-10-11 DIAGNOSIS — Z9049 Acquired absence of other specified parts of digestive tract: Secondary | ICD-10-CM

## 2019-10-11 DIAGNOSIS — C181 Malignant neoplasm of appendix: Principal | ICD-10-CM | POA: Diagnosis present

## 2019-10-11 DIAGNOSIS — Z87442 Personal history of urinary calculi: Secondary | ICD-10-CM

## 2019-10-11 DIAGNOSIS — Z86010 Personal history of colonic polyps: Secondary | ICD-10-CM | POA: Diagnosis not present

## 2019-10-11 DIAGNOSIS — K388 Other specified diseases of appendix: Secondary | ICD-10-CM | POA: Diagnosis present

## 2019-10-11 HISTORY — PX: LAPAROSCOPIC PARTIAL COLECTOMY: SHX5907

## 2019-10-11 LAB — CBC WITH DIFFERENTIAL/PLATELET
Abs Immature Granulocytes: 0.01 10*3/uL (ref 0.00–0.07)
Basophils Absolute: 0 10*3/uL (ref 0.0–0.1)
Basophils Relative: 1 %
Eosinophils Absolute: 0.1 10*3/uL (ref 0.0–0.5)
Eosinophils Relative: 2 %
HCT: 47.4 % (ref 39.0–52.0)
Hemoglobin: 15.8 g/dL (ref 13.0–17.0)
Immature Granulocytes: 0 %
Lymphocytes Relative: 30 %
Lymphs Abs: 1.3 10*3/uL (ref 0.7–4.0)
MCH: 30.7 pg (ref 26.0–34.0)
MCHC: 33.3 g/dL (ref 30.0–36.0)
MCV: 92 fL (ref 80.0–100.0)
Monocytes Absolute: 0.3 10*3/uL (ref 0.1–1.0)
Monocytes Relative: 6 %
Neutro Abs: 2.7 10*3/uL (ref 1.7–7.7)
Neutrophils Relative %: 61 %
Platelets: 265 10*3/uL (ref 150–400)
RBC: 5.15 MIL/uL (ref 4.22–5.81)
RDW: 12.8 % (ref 11.5–15.5)
WBC: 4.4 10*3/uL (ref 4.0–10.5)
nRBC: 0 % (ref 0.0–0.2)

## 2019-10-11 LAB — COMPREHENSIVE METABOLIC PANEL
ALT: 30 U/L (ref 0–44)
AST: 29 U/L (ref 15–41)
Albumin: 4.4 g/dL (ref 3.5–5.0)
Alkaline Phosphatase: 54 U/L (ref 38–126)
Anion gap: 8 (ref 5–15)
BUN: 11 mg/dL (ref 6–20)
CO2: 25 mmol/L (ref 22–32)
Calcium: 9.1 mg/dL (ref 8.9–10.3)
Chloride: 103 mmol/L (ref 98–111)
Creatinine, Ser: 1.1 mg/dL (ref 0.61–1.24)
GFR calc Af Amer: >60 mL/min (ref >60)
GFR calc non Af Amer: >60 mL/min (ref >60)
Glucose, Bld: 127 mg/dL — ABNORMAL HIGH (ref 70–99)
Potassium: 3.4 mmol/L — ABNORMAL LOW (ref 3.5–5.1)
Sodium: 136 mmol/L (ref 135–145)
Total Bilirubin: 1.1 mg/dL (ref 0.3–1.2)
Total Protein: 7.6 g/dL (ref 6.5–8.1)

## 2019-10-11 LAB — PROTIME-INR
INR: 1 (ref 0.8–1.2)
Prothrombin Time: 13.2 seconds (ref 11.4–15.2)

## 2019-10-11 LAB — APTT: aPTT: 32 seconds (ref 24–36)

## 2019-10-11 LAB — ABO/RH: ABO/RH(D): O POS

## 2019-10-11 LAB — TYPE AND SCREEN
ABO/RH(D): O POS
Antibody Screen: NEGATIVE

## 2019-10-11 SURGERY — LAPAROSCOPIC PARTIAL COLECTOMY
Anesthesia: General | Site: Abdomen | Laterality: Right

## 2019-10-11 MED ORDER — DIPHENHYDRAMINE HCL 12.5 MG/5ML PO ELIX: 12.5000 mg | ORAL_SOLUTION | Freq: Four times a day (QID) | ORAL | Status: DC | PRN

## 2019-10-11 MED ORDER — FENTANYL CITRATE (PF) 100 MCG/2ML IJ SOLN
INTRAMUSCULAR | Status: AC
  Filled 2019-10-11: qty 2

## 2019-10-11 MED ORDER — ENSURE SURGERY PO LIQD
237.0000 mL | Freq: Two times a day (BID) | ORAL | Status: DC
  Administered 2019-10-12 – 2019-10-13 (×4): 237 mL via ORAL
  Filled 2019-10-11 (×6): qty 237

## 2019-10-11 MED ORDER — MIDAZOLAM HCL 5 MG/5ML IJ SOLN
INTRAMUSCULAR | Status: DC | PRN
  Administered 2019-10-11: 2 mg via INTRAVENOUS

## 2019-10-11 MED ORDER — METOPROLOL SUCCINATE ER 50 MG PO TB24
50.0000 mg | ORAL_TABLET | Freq: Every day | ORAL | Status: DC
  Administered 2019-10-12 – 2019-10-14 (×3): 50 mg via ORAL
  Filled 2019-10-11 (×3): qty 1

## 2019-10-11 MED ORDER — LACTATED RINGERS IV SOLN
INTRAVENOUS | Status: DC
  Administered 2019-10-11: 23:00:00 via INTRAVENOUS

## 2019-10-11 MED ORDER — SUGAMMADEX SODIUM 500 MG/5ML IV SOLN
INTRAVENOUS | Status: DC | PRN
  Administered 2019-10-11: 200 mg via INTRAVENOUS

## 2019-10-11 MED ORDER — LACTATED RINGERS IV SOLN
INTRAVENOUS | Status: DC
  Administered 2019-10-11 (×2): via INTRAVENOUS

## 2019-10-11 MED ORDER — CHLORHEXIDINE GLUCONATE CLOTH 2 % EX PADS: 6.0000 | MEDICATED_PAD | Freq: Once | CUTANEOUS | Status: DC

## 2019-10-11 MED ORDER — PANTOPRAZOLE SODIUM 40 MG PO TBEC
40.0000 mg | DELAYED_RELEASE_TABLET | Freq: Every day | ORAL | Status: DC
  Administered 2019-10-12 – 2019-10-14 (×3): 40 mg via ORAL
  Filled 2019-10-11 (×3): qty 1

## 2019-10-11 MED ORDER — LIDOCAINE 20MG/ML (2%) 15 ML SYRINGE OPTIME
INTRAMUSCULAR | Status: DC | PRN
  Administered 2019-10-11: 1.5 mg/kg/h via INTRAVENOUS

## 2019-10-11 MED ORDER — KETAMINE HCL 10 MG/ML IJ SOLN
INTRAMUSCULAR | Status: AC
  Filled 2019-10-11: qty 1

## 2019-10-11 MED ORDER — NEOMYCIN SULFATE 500 MG PO TABS
1000.0000 mg | ORAL_TABLET | ORAL | Status: DC
  Filled 2019-10-11: qty 2

## 2019-10-11 MED ORDER — ROCURONIUM BROMIDE 10 MG/ML (PF) SYRINGE
PREFILLED_SYRINGE | INTRAVENOUS | Status: AC
  Filled 2019-10-11: qty 10

## 2019-10-11 MED ORDER — MIDAZOLAM HCL 2 MG/2ML IJ SOLN
INTRAMUSCULAR | Status: AC
  Filled 2019-10-11: qty 2

## 2019-10-11 MED ORDER — ALVIMOPAN 12 MG PO CAPS
12.0000 mg | ORAL_CAPSULE | Freq: Two times a day (BID) | ORAL | Status: DC
  Administered 2019-10-12 – 2019-10-13 (×3): 12 mg via ORAL
  Filled 2019-10-11 (×3): qty 1

## 2019-10-11 MED ORDER — POLYETHYLENE GLYCOL 3350 17 GM/SCOOP PO POWD
1.0000 | Freq: Once | ORAL | Status: DC
  Filled 2019-10-11: qty 255

## 2019-10-11 MED ORDER — LIDOCAINE 2% (20 MG/ML) 5 ML SYRINGE
INTRAMUSCULAR | Status: DC | PRN
  Administered 2019-10-11: 60 mg via INTRAVENOUS

## 2019-10-11 MED ORDER — ALUM & MAG HYDROXIDE-SIMETH 200-200-20 MG/5ML PO SUSP: 30.0000 mL | Freq: Four times a day (QID) | ORAL | Status: DC | PRN

## 2019-10-11 MED ORDER — FENTANYL CITRATE (PF) 100 MCG/2ML IJ SOLN
25.0000 ug | INTRAMUSCULAR | Status: DC | PRN
  Administered 2019-10-11: 25 ug via INTRAVENOUS

## 2019-10-11 MED ORDER — PROPOFOL 10 MG/ML IV BOLUS
INTRAVENOUS | Status: AC
  Filled 2019-10-11: qty 20

## 2019-10-11 MED ORDER — HEPARIN SODIUM (PORCINE) 5000 UNIT/ML IJ SOLN
5000.0000 [IU] | Freq: Three times a day (TID) | INTRAMUSCULAR | Status: DC
  Administered 2019-10-12 – 2019-10-14 (×7): 5000 [IU] via SUBCUTANEOUS
  Filled 2019-10-11 (×8): qty 1

## 2019-10-11 MED ORDER — PROPOFOL 10 MG/ML IV BOLUS
INTRAVENOUS | Status: DC | PRN
  Administered 2019-10-11: 170 mg via INTRAVENOUS

## 2019-10-11 MED ORDER — BUPIVACAINE LIPOSOME 1.3 % IJ SUSP
INTRAMUSCULAR | Status: DC | PRN
  Administered 2019-10-11: 20 mL

## 2019-10-11 MED ORDER — ROCURONIUM BROMIDE 10 MG/ML (PF) SYRINGE
PREFILLED_SYRINGE | INTRAVENOUS | Status: DC | PRN
  Administered 2019-10-11: 20 mg via INTRAVENOUS
  Administered 2019-10-11: 60 mg via INTRAVENOUS
  Administered 2019-10-11 (×2): 20 mg via INTRAVENOUS

## 2019-10-11 MED ORDER — LIDOCAINE 2% (20 MG/ML) 5 ML SYRINGE
INTRAMUSCULAR | Status: AC
  Filled 2019-10-11: qty 5

## 2019-10-11 MED ORDER — KETAMINE HCL 10 MG/ML IJ SOLN
INTRAMUSCULAR | Status: DC | PRN
  Administered 2019-10-11: 40 mg via INTRAVENOUS

## 2019-10-11 MED ORDER — AMLODIPINE BESYLATE 10 MG PO TABS
10.0000 mg | ORAL_TABLET | Freq: Every day | ORAL | Status: DC
  Administered 2019-10-12 – 2019-10-14 (×3): 10 mg via ORAL
  Filled 2019-10-11 (×3): qty 1

## 2019-10-11 MED ORDER — BUPIVACAINE HCL 0.25 % IJ SOLN
INTRAMUSCULAR | Status: AC
  Filled 2019-10-11: qty 1

## 2019-10-11 MED ORDER — ONDANSETRON HCL 4 MG PO TABS: 4.0000 mg | ORAL_TABLET | Freq: Four times a day (QID) | ORAL | Status: DC | PRN

## 2019-10-11 MED ORDER — BUPIVACAINE HCL (PF) 0.25 % IJ SOLN
INTRAMUSCULAR | Status: AC
  Filled 2019-10-11: qty 30

## 2019-10-11 MED ORDER — ONDANSETRON HCL 4 MG/2ML IJ SOLN: 4.0000 mg | Freq: Four times a day (QID) | INTRAMUSCULAR | Status: DC | PRN

## 2019-10-11 MED ORDER — FENTANYL CITRATE (PF) 250 MCG/5ML IJ SOLN
INTRAMUSCULAR | Status: AC
  Filled 2019-10-11: qty 5

## 2019-10-11 MED ORDER — ALVIMOPAN 12 MG PO CAPS
12.0000 mg | ORAL_CAPSULE | ORAL | Status: AC
  Administered 2019-10-11: 11:00:00 12 mg via ORAL
  Filled 2019-10-11: qty 1

## 2019-10-11 MED ORDER — IBUPROFEN 200 MG PO TABS: 600.0000 mg | ORAL_TABLET | Freq: Four times a day (QID) | ORAL | Status: DC | PRN

## 2019-10-11 MED ORDER — SODIUM CHLORIDE 0.9 % IV SOLN
2.0000 g | INTRAVENOUS | Status: AC
  Administered 2019-10-11: 2 g via INTRAVENOUS
  Filled 2019-10-11: qty 2

## 2019-10-11 MED ORDER — MIRABEGRON ER 25 MG PO TB24
25.0000 mg | ORAL_TABLET | Freq: Every day | ORAL | Status: DC
  Administered 2019-10-12 – 2019-10-14 (×3): 25 mg via ORAL
  Filled 2019-10-11 (×3): qty 1

## 2019-10-11 MED ORDER — HEPARIN SODIUM (PORCINE) 5000 UNIT/ML IJ SOLN
5000.0000 [IU] | Freq: Once | INTRAMUSCULAR | Status: AC
  Administered 2019-10-11: 5000 [IU] via SUBCUTANEOUS
  Filled 2019-10-11: qty 1

## 2019-10-11 MED ORDER — LACTATED RINGERS IR SOLN
Status: DC | PRN
  Administered 2019-10-11: 1000 mL

## 2019-10-11 MED ORDER — FENTANYL CITRATE (PF) 250 MCG/5ML IJ SOLN
INTRAMUSCULAR | Status: DC | PRN
  Administered 2019-10-11: 100 ug via INTRAVENOUS
  Administered 2019-10-11: 50 ug via INTRAVENOUS

## 2019-10-11 MED ORDER — LIDOCAINE HCL 2 % IJ SOLN
INTRAMUSCULAR | Status: AC
  Filled 2019-10-11: qty 20

## 2019-10-11 MED ORDER — METRONIDAZOLE 500 MG PO TABS
1000.0000 mg | ORAL_TABLET | ORAL | Status: DC
  Filled 2019-10-11: qty 2

## 2019-10-11 MED ORDER — DIPHENHYDRAMINE HCL 50 MG/ML IJ SOLN: 12.5000 mg | Freq: Four times a day (QID) | INTRAMUSCULAR | Status: DC | PRN

## 2019-10-11 MED ORDER — BISACODYL 5 MG PO TBEC
20.0000 mg | DELAYED_RELEASE_TABLET | Freq: Once | ORAL | Status: DC
  Filled 2019-10-11: qty 4

## 2019-10-11 MED ORDER — HYDROMORPHONE HCL 1 MG/ML IJ SOLN: 0.5000 mg | INTRAMUSCULAR | Status: DC | PRN

## 2019-10-11 MED ORDER — ONDANSETRON HCL 4 MG/2ML IJ SOLN
INTRAMUSCULAR | Status: DC | PRN
  Administered 2019-10-11: 4 mg via INTRAVENOUS

## 2019-10-11 MED ORDER — 0.9 % SODIUM CHLORIDE (POUR BTL) OPTIME
TOPICAL | Status: DC | PRN
  Administered 2019-10-11: 1000 mL

## 2019-10-11 MED ORDER — ACETAMINOPHEN 500 MG PO TABS
1000.0000 mg | ORAL_TABLET | ORAL | Status: AC
  Administered 2019-10-11: 1000 mg via ORAL
  Filled 2019-10-11: qty 2

## 2019-10-11 MED ORDER — BUPIVACAINE HCL (PF) 0.25 % IJ SOLN
INTRAMUSCULAR | Status: DC | PRN
  Administered 2019-10-11: 30 mL

## 2019-10-11 MED ORDER — TRAMADOL HCL 50 MG PO TABS
50.0000 mg | ORAL_TABLET | Freq: Four times a day (QID) | ORAL | Status: DC | PRN
  Administered 2019-10-11 – 2019-10-12 (×2): 50 mg via ORAL
  Filled 2019-10-11 (×2): qty 1

## 2019-10-11 MED ORDER — DEXAMETHASONE SODIUM PHOSPHATE 10 MG/ML IJ SOLN
INTRAMUSCULAR | Status: AC
  Filled 2019-10-11: qty 1

## 2019-10-11 MED ORDER — ACETAMINOPHEN 500 MG PO TABS
1000.0000 mg | ORAL_TABLET | Freq: Four times a day (QID) | ORAL | Status: DC
  Administered 2019-10-11 – 2019-10-14 (×9): 1000 mg via ORAL
  Filled 2019-10-11 (×10): qty 2

## 2019-10-11 MED ORDER — ONDANSETRON HCL 4 MG/2ML IJ SOLN
INTRAMUSCULAR | Status: AC
  Filled 2019-10-11: qty 2

## 2019-10-11 MED ORDER — DEXAMETHASONE SODIUM PHOSPHATE 10 MG/ML IJ SOLN
INTRAMUSCULAR | Status: DC | PRN
  Administered 2019-10-11: 10 mg via INTRAVENOUS

## 2019-10-11 SURGICAL SUPPLY — 75 items
ADH SKN CLS APL DERMABOND .7 (GAUZE/BANDAGES/DRESSINGS) ×1
APPLIER CLIP 5 13 M/L LIGAMAX5 (MISCELLANEOUS)
APPLIER CLIP ROT 10 11.4 M/L (STAPLE)
APR CLP MED LRG 11.4X10 (STAPLE)
APR CLP MED LRG 5 ANG JAW (MISCELLANEOUS)
BLADE EXTENDED COATED 6.5IN (ELECTRODE) IMPLANT
CABLE HIGH FREQUENCY MONO STRZ (ELECTRODE) ×3 IMPLANT
CATH MUSHROOM 28FR (CATHETERS) IMPLANT
CATH MUSHROOM 30FR (CATHETERS) IMPLANT
CELLS DAT CNTRL 66122 CELL SVR (MISCELLANEOUS) IMPLANT
CLIP APPLIE 5 13 M/L LIGAMAX5 (MISCELLANEOUS) IMPLANT
CLIP APPLIE ROT 10 11.4 M/L (STAPLE) IMPLANT
COVER WAND RF STERILE (DRAPES) IMPLANT
DECANTER SPIKE VIAL GLASS SM (MISCELLANEOUS) ×3 IMPLANT
DERMABOND ADVANCED (GAUZE/BANDAGES/DRESSINGS) ×2
DERMABOND ADVANCED .7 DNX12 (GAUZE/BANDAGES/DRESSINGS) ×1 IMPLANT
DISSECTOR BLUNT TIP ENDO 5MM (MISCELLANEOUS) IMPLANT
DRAIN CHANNEL 19F RND (DRAIN) IMPLANT
DRAPE SURG IRRIG POUCH 19X23 (DRAPES) ×3 IMPLANT
DRSG OPSITE POSTOP 4X10 (GAUZE/BANDAGES/DRESSINGS) IMPLANT
DRSG OPSITE POSTOP 4X6 (GAUZE/BANDAGES/DRESSINGS) ×3 IMPLANT
DRSG OPSITE POSTOP 4X8 (GAUZE/BANDAGES/DRESSINGS) IMPLANT
DRSG TEGADERM 2-3/8X2-3/4 SM (GAUZE/BANDAGES/DRESSINGS) ×3 IMPLANT
ELECT REM PT RETURN 15FT ADLT (MISCELLANEOUS) ×3 IMPLANT
EVACUATOR SILICONE 100CC (DRAIN) IMPLANT
GAUZE SPONGE 4X4 12PLY STRL (GAUZE/BANDAGES/DRESSINGS) ×3 IMPLANT
GLOVE BIO SURGEON STRL SZ7.5 (GLOVE) ×6 IMPLANT
GLOVE INDICATOR 8.0 STRL GRN (GLOVE) ×6 IMPLANT
GOWN STRL REUS W/TWL XL LVL3 (GOWN DISPOSABLE) ×12 IMPLANT
HOLDER FOLEY CATH W/STRAP (MISCELLANEOUS) ×3 IMPLANT
KIT TURNOVER KIT A (KITS) IMPLANT
LIGASURE IMPACT 36 18CM CVD LR (INSTRUMENTS) IMPLANT
NEEDLE INSUFFLATION 14GA 120MM (NEEDLE) IMPLANT
PACK COLON (CUSTOM PROCEDURE TRAY) ×3 IMPLANT
PAD POSITIONING PINK XL (MISCELLANEOUS) ×3 IMPLANT
PORT LAP GEL ALEXIS MED 5-9CM (MISCELLANEOUS) ×3 IMPLANT
RELOAD PROXIMATE 75MM BLUE (ENDOMECHANICALS) ×6 IMPLANT
RTRCTR WOUND ALEXIS 18CM MED (MISCELLANEOUS)
SCISSORS LAP 5X35 DISP (ENDOMECHANICALS) ×3 IMPLANT
SEALER TISSUE G2 STRG ARTC 35C (ENDOMECHANICALS) ×3 IMPLANT
SET IRRIG TUBING LAPAROSCOPIC (IRRIGATION / IRRIGATOR) ×3 IMPLANT
SET TUBE SMOKE EVAC HIGH FLOW (TUBING) ×3 IMPLANT
SLEEVE ADV FIXATION 5X100MM (TROCAR) ×6 IMPLANT
SPONGE DRAIN TRACH 4X4 STRL 2S (GAUZE/BANDAGES/DRESSINGS) IMPLANT
STAPLER GUN LINEAR PROX 60 (STAPLE) ×3 IMPLANT
STAPLER PROXIMATE 75MM BLUE (STAPLE) ×3 IMPLANT
STAPLER VISISTAT 35W (STAPLE) IMPLANT
SUT ETHILON 3 0 PS 1 (SUTURE) IMPLANT
SUT PDS AB 1 CTX 36 (SUTURE) IMPLANT
SUT PDS AB 1 TP1 54 (SUTURE) IMPLANT
SUT PDS AB 1 TP1 96 (SUTURE) IMPLANT
SUT PROLENE 2 0 KS (SUTURE) ×3 IMPLANT
SUT PROLENE 2 0 SH DA (SUTURE) ×3 IMPLANT
SUT SILK 2 0 (SUTURE) ×3
SUT SILK 2 0 SH CR/8 (SUTURE) ×3 IMPLANT
SUT SILK 2-0 18XBRD TIE 12 (SUTURE) ×1 IMPLANT
SUT SILK 3 0 (SUTURE) ×3
SUT SILK 3 0 SH CR/8 (SUTURE) ×6 IMPLANT
SUT SILK 3-0 18XBRD TIE 12 (SUTURE) ×1 IMPLANT
SUT VIC AB 2-0 SH 27 (SUTURE)
SUT VIC AB 2-0 SH 27X BRD (SUTURE) IMPLANT
SUT VIC AB 3-0 SH 18 (SUTURE) IMPLANT
SUT VIC AB 3-0 SH 27 (SUTURE)
SUT VIC AB 3-0 SH 27X BRD (SUTURE) IMPLANT
SUT VICRYL 2 0 18  UND BR (SUTURE) ×2
SUT VICRYL 2 0 18 UND BR (SUTURE) ×1 IMPLANT
SYS LAPSCP GELPORT 120MM (MISCELLANEOUS)
SYSTEM LAPSCP GELPORT 120MM (MISCELLANEOUS) IMPLANT
TOWEL OR 17X26 10 PK STRL BLUE (TOWEL DISPOSABLE) IMPLANT
TOWEL OR NON WOVEN STRL DISP B (DISPOSABLE) ×3 IMPLANT
TRAY FOLEY MTR SLVR 14FR STAT (SET/KITS/TRAYS/PACK) IMPLANT
TRAY FOLEY MTR SLVR 16FR STAT (SET/KITS/TRAYS/PACK) IMPLANT
TRAY IRRIG W/60CC SYR STRL (SET/KITS/TRAYS/PACK) ×3 IMPLANT
TROCAR ADV FIXATION 5X100MM (TROCAR) ×3 IMPLANT
TROCAR XCEL BLUNT TIP 100MML (ENDOMECHANICALS) IMPLANT

## 2019-10-11 NOTE — H&P (Signed)
CC: Here today for surgery  HPI: [name] is a very pleasant 48yoM with hx of kidney stones who had left flank pain 08/15/2019. He was evaluated by [name], NP, who ordered a CT scan of the abdomen and pelvis with contrast. No definite acute abnormalities. He was found to have nonobstructing stones in the left kidney without hydronephrosis. He also is found to have a calcification in the left hemipelvis possibly phlebolith or nonobstructing stone. He was found to have a 6 cm cystic structure involving the distal appendix without periappendiceal inflammatory changes. There are also enlarged lymph nodes in the right lower quadrant. Differential includes mucocele versus less likely acute appendicitis in the absence of right lower quadrant pain however less likely. Indeterminate 1 severe hypoattenuating segment 6 region which was indeterminate. He is subsequently referred to [place] for follow-up. He also has follow-up scheduled with [facility], Dr. Gloriann Loan. He continues to have left flank discomfort. He denies any fevers/chills/nausea/vomiting. He has been moving his bowels normally. He denies any right-sided abdominal pain including in the right lower quadrant.  Last colonoscopy was in 2011 with Dr. Ardis Hughs. Small polyp was removed  He underwent colonoscopy with Dr. Ardis Hughs 09/12/2019 which demonstrated a smooth, 1 severe bulging inward of the mucosa at the appendiceal orifice. A single 2 mm polyp was found the descending colon and removed. Internal hemorrhoids.  Path returned hyperplastic polyp on the descending colon. His case is also reviewed through our multidisciplinary tumor board and the consensus was given the enlarged lymph nodes, considering right hemicolectomy. He has been doing well. He denies any complaints today. He has been working [place].  He denies any changes in his health or health history since we met in the office. He took his bowel prep without issue and is  ready for surgery.   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 Works with [employer]  ROS: A comprehensive 10 system review of systems was completed with the patient and pertinent findings as noted above.  Past Medical History:  Diagnosis Date  . Allergy   . Diverticulitis   . GERD (gastroesophageal reflux disease)   . History of colon polyps   . Hypertension   . OAB (overactive bladder)   . 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 Seasonal allergies     Past Surgical History:  Procedure Laterality Date  . BIOPSY PROSTATE  5/13   Dr. Lowella Bandy- negative  . COLONOSCOPY  09/12/2019  . SPLENECTOMY      Family History  Problem Relation Age of Onset  . Lupus Mother   . Vision loss Mother        in left eye  . Hypertension Father   . COPD Father   . Obesity Sister   . Thyroid disease Sister   . Multiple sclerosis Sister   . Cancer Neg Hx        prostate  . Colon cancer Neg Hx   . Colon polyps Neg Hx   . Esophageal cancer Neg Hx   . Rectal cancer Neg Hx   . Stomach cancer Neg Hx     Social:  reports that he has been smoking cigars. He has never used smokeless tobacco. He reports current alcohol use of about 1.0 standard drinks of alcohol per week. He reports that he does not use drugs.  Allergies: No Known Allergies  Medications:  I have reviewed the patient's current medications.  Results for orders placed or performed during the hospital encounter of 10/11/19 (from the past 48 hour(s))  APTT     Status: None   Collection Time: 10/11/19 11:00 AM  Result Value Ref Range   aPTT 32 24 - 36 seconds    Comment: Performed at [facility], [place] [address] [place], [place] [address]  CBC WITH DIFFERENTIAL     Status: None   Collection Time: 10/11/19 11:00 AM  Result Value Ref Range   WBC 4.4 4.0 - 10.5 K/uL   RBC 5.15 4.22 - 5.81 MIL/uL    Hemoglobin 15.8 13.0 - 17.0 g/dL   HCT 47.4 39.0 - 52.0 %   MCV 92.0 80.0 - 100.0 fL   MCH 30.7 26.0 - 34.0 pg   MCHC 33.3 30.0 - 36.0 g/dL   RDW 12.8 11.5 - 15.5 %   Platelets 265 150 - 400 K/uL   nRBC 0.0 0.0 - 0.2 %   Neutrophils Relative % 61 %   Neutro Abs 2.7 1.7 - 7.7 K/uL   Lymphocytes Relative 30 %   Lymphs Abs 1.3 0.7 - 4.0 K/uL   Monocytes Relative 6 %   Monocytes Absolute 0.3 0.1 - 1.0 K/uL   Eosinophils Relative 2 %   Eosinophils Absolute 0.1 0.0 - 0.5 K/uL   Basophils Relative 1 %   Basophils Absolute 0.0 0.0 - 0.1 K/uL   Immature Granulocytes 0 %   Abs Immature Granulocytes 0.01 0.00 - 0.07 K/uL    Comment: Performed at [facility], [place] [address] [place], [place] [address]  Comprehensive metabolic panel     Status: Abnormal   Collection Time: 10/11/19 11:00 AM  Result Value Ref Range   Sodium 136 135 - 145 mmol/L   Potassium 3.4 (L) 3.5 - 5.1 mmol/L   Chloride 103 98 - 111 mmol/L   CO2 25 22 - 32 mmol/L   Glucose, Bld 127 (H) 70 - 99 mg/dL   BUN 11 6 - 20 mg/dL   Creatinine, Ser 1.10 0.61 - 1.24 mg/dL   Calcium 9.1 8.9 - 10.3 mg/dL   Total Protein 7.6 6.5 - 8.1 g/dL   Albumin 4.4 3.5 - 5.0 g/dL   AST 29 15 - 41 U/L   ALT 30 0 - 44 U/L   Alkaline Phosphatase 54 38 - 126 U/L   Total Bilirubin 1.1 0.3 - 1.2 mg/dL   GFR calc non Af Amer >60 >60 mL/min   GFR calc Af Amer >60 >60 mL/min   Anion gap 8 5 - 15    Comment: Performed at [facility], [place] [address] [place], [place] [address]  Protime-INR     Status: None   Collection Time: 10/11/19 11:00 AM  Result Value Ref Range   Prothrombin Time 13.2 11.4 - 15.2 seconds   INR 1.0 0.8 - 1.2    Comment: (NOTE) INR goal varies based on device and disease states. Performed at [facility], [place] [address], [place], [place] [address]   Type and screen     Status: None   Collection Time: 10/11/19 11:00 AM  Result Value Ref Range   ABO/RH(D) O POS     Antibody Screen NEG    Sample Expiration      10/14/2019,2359 Performed at [facility], [place] [address] [place], [place] [address]   ABO/Rh     Status: None (Preliminary result)  Collection Time: 10/11/19 11:00 AM  Result Value Ref Range   ABO/RH(D)      O POS Performed at [facility], [place] [address] [place], [place] [address]     No results found.  ROS - all of the below systems have been reviewed with the patient and positives are indicated with bold text General: chills, fever or night sweats Eyes: blurry vision or double vision ENT: epistaxis or sore throat Allergy/Immunology: itchy/watery eyes or nasal congestion Hematologic/Lymphatic: bleeding problems, blood clots or swollen lymph nodes Endocrine: temperature intolerance or unexpected weight changes Breast: new or changing breast lumps or nipple discharge Resp: cough, shortness of breath, or wheezing CV: chest pain or dyspnea on exertion GI: as per HPI GU: dysuria, trouble voiding, or hematuria MSK: joint pain or joint stiffness Neuro: TIA or stroke symptoms Derm: pruritus and skin lesion changes Psych: anxiety and depression  PE Blood pressure (!) 148/81, pulse 60, temperature 98.1 F (36.7 C), temperature source Oral, resp. rate 18, height '6\' 2"'$  (1.88 m), weight 99.1 kg, SpO2 100 %. Constitutional: NAD; conversant; no deformities; wearing surgical mask Eyes: Moist conjunctiva; no lid lag; anicteric; PERRL Neck: Trachea midline; no thyromegaly Lungs: Normal respiratory effort; no tactile fremitus CV: RRR; no palpable thrills; no pitting edema GI: Abd soft, NT/ND; no palpable hepatosplenomegaly MSK: Normal gait; no clubbing/cyanosis Psychiatric: Appropriate affect; alert and oriented x3 Lymphatic: No palpable cervical or axillary lymphadenopathy  Results for orders placed or performed during the hospital encounter of 10/11/19 (from the past 48 hour(s))  APTT     Status:  None   Collection Time: 10/11/19 11:00 AM  Result Value Ref Range   aPTT 32 24 - 36 seconds    Comment: Performed at [facility], [place] [address], [place], [place] [address]  CBC WITH DIFFERENTIAL     Status: None   Collection Time: 10/11/19 11:00 AM  Result Value Ref Range   WBC 4.4 4.0 - 10.5 K/uL   RBC 5.15 4.22 - 5.81 MIL/uL   Hemoglobin 15.8 13.0 - 17.0 g/dL   HCT 47.4 39.0 - 52.0 %   MCV 92.0 80.0 - 100.0 fL   MCH 30.7 26.0 - 34.0 pg   MCHC 33.3 30.0 - 36.0 g/dL   RDW 12.8 11.5 - 15.5 %   Platelets 265 150 - 400 K/uL   nRBC 0.0 0.0 - 0.2 %   Neutrophils Relative % 61 %   Neutro Abs 2.7 1.7 - 7.7 K/uL   Lymphocytes Relative 30 %   Lymphs Abs 1.3 0.7 - 4.0 K/uL   Monocytes Relative 6 %   Monocytes Absolute 0.3 0.1 - 1.0 K/uL   Eosinophils Relative 2 %   Eosinophils Absolute 0.1 0.0 - 0.5 K/uL   Basophils Relative 1 %   Basophils Absolute 0.0 0.0 - 0.1 K/uL   Immature Granulocytes 0 %   Abs Immature Granulocytes 0.01 0.00 - 0.07 K/uL    Comment: Performed at [facility], [place] [address], [place], [place] [address]  Comprehensive metabolic panel     Status: Abnormal   Collection Time: 10/11/19 11:00 AM  Result Value Ref Range   Sodium 136 135 - 145 mmol/L   Potassium 3.4 (L) 3.5 - 5.1 mmol/L   Chloride 103 98 - 111 mmol/L   CO2 25 22 - 32 mmol/L   Glucose, Bld 127 (H) 70 - 99 mg/dL   BUN 11 6 - 20 mg/dL   Creatinine, Ser 1.10 0.61 -  1.24 mg/dL   Calcium 9.1 8.9 - 10.3 mg/dL   Total Protein 7.6 6.5 - 8.1 g/dL   Albumin 4.4 3.5 - 5.0 g/dL   AST 29 15 - 41 U/L   ALT 30 0 - 44 U/L   Alkaline Phosphatase 54 38 - 126 U/L   Total Bilirubin 1.1 0.3 - 1.2 mg/dL   GFR calc non Af Amer >60 >60 mL/min   GFR calc Af Amer >60 >60 mL/min   Anion gap 8 5 - 15    Comment: Performed at [facility], [place] [address] [place], [place] [address]  Protime-INR     Status: None   Collection Time: 10/11/19 11:00 AM  Result Value  Ref Range   Prothrombin Time 13.2 11.4 - 15.2 seconds   INR 1.0 0.8 - 1.2    Comment: (NOTE) INR goal varies based on device and disease states. Performed at [facility], [place] [address] [place], [place] [address]   Type and screen     Status: None   Collection Time: 10/11/19 11:00 AM  Result Value Ref Range   ABO/RH(D) O POS    Antibody Screen NEG    Sample Expiration      10/14/2019,2359 Performed at [facility], [place] [address], [place], [place] [address]   ABO/Rh     Status: None (Preliminary result)   Collection Time: 10/11/19 11:00 AM  Result Value Ref Range   ABO/RH(D)      O POS Performed at [facility], [place] [address] [place], [place] [address]     No results found.  A/P: Darin Smith is a very pleasant 63yoM with incidentally found 6 cm appendiceal cystic lesion on workup for left flank pain.  -Given prominent lymph nodes in right colon mesentery in setting of possible appendiceal tumor, we discussed proceeding with laparoscopic possible open right hemicolectomy. -The anatomy and physiology of the GI tract was discussed at length with the patient and his wife again today. The pathophysiology of appendiceal lesions was reviewed at length with associated pictures as well. -The planned procedure, material risks (including, but not limited to, pain, bleeding, infection, scarring, need for blood transfusion, damage to surrounding structures- blood vessels/nerves/viscus/organs, damage to ureter, urine leak, leak from anastomosis, need for additional procedures, possibility of stoma, hernia, recurrence, positive margins/need for further surgery, worsening of pre-existing medical conditions; persistence of abdominal symptoms, pneumonia, heart attack, stroke, death) benefits and alternatives to surgery were discussed at length. We reviewed the rational for surgery. The patient's and his wife's questions were answered to their  satisfaction, they voiced understanding and elected to proceed. Additionally, we discussed typical postoperative expectations and the recovery process. 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 Dema Severin, M.D. [facility]

## 2019-10-11 NOTE — Anesthesia Procedure Notes (Signed)
Procedure Name: Intubation Date/Time: 10/11/2019 12:45 PM Performed by: Talbot Grumbling, CRNA Pre-anesthesia Checklist: Patient identified, Emergency Drugs available, Suction available and Patient being monitored Patient Re-evaluated:Patient Re-evaluated prior to induction Oxygen Delivery Method: Circle system utilized Preoxygenation: Pre-oxygenation with 100% oxygen Induction Type: IV induction Ventilation: Mask ventilation without difficulty Laryngoscope Size: Mac and 3 Grade View: Grade I Tube type: Oral Tube size: 7.5 mm Number of attempts: 1 Airway Equipment and Method: Stylet Placement Confirmation: ETT inserted through vocal cords under direct vision,  positive ETCO2 and breath sounds checked- equal and bilateral Secured at: 23 cm Tube secured with: Tape Dental Injury: Teeth and Oropharynx as per pre-operative assessment

## 2019-10-11 NOTE — Anesthesia Preprocedure Evaluation (Addendum)
Anesthesia Evaluation  Patient identified by MRN, date of birth, ID band Patient awake    Airway Mallampati: II  TM Distance: >3 FB     Dental   Pulmonary Current Smoker,    breath sounds clear to auscultation       Cardiovascular hypertension,  Rhythm:Regular Rate:Normal     Neuro/Psych    GI/Hepatic Neg liver ROS, GERD  ,  Endo/Other  negative endocrine ROS  Renal/GU negative Renal ROS     Musculoskeletal   Abdominal   Peds  Hematology   Anesthesia Other Findings   Reproductive/Obstetrics                             Anesthesia Physical Anesthesia Plan  ASA: III  Anesthesia Plan: General   Post-op Pain Management:    Induction: Intravenous  PONV Risk Score and Plan: 1 and Ondansetron, Dexamethasone and Midazolam  Airway Management Planned:   Additional Equipment:   Intra-op Plan:   Post-operative Plan: Possible Post-op intubation/ventilation  Informed Consent: I have reviewed the patients History and Physical, chart, labs and discussed the procedure including the risks, benefits and alternatives for the proposed anesthesia with the patient or authorized representative who has indicated his/her understanding and acceptance.     Dental advisory given  Plan Discussed with: CRNA and Anesthesiologist  Anesthesia Plan Comments:         Anesthesia Quick Evaluation

## 2019-10-11 NOTE — Transfer of Care (Signed)
Immediate Anesthesia Transfer of Care Note  Patient: Darin Smith  Procedure(s) Performed: LAPAROSCOPIC RIGHT HEMICOLECTOMY (Right Abdomen)  Patient Location: PACU  Anesthesia Type:General  Level of Consciousness: awake, alert  and patient cooperative  Airway & Oxygen Therapy: Patient Spontanous Breathing and Patient connected to face mask oxygen  Post-op Assessment: Report given to RN and Post -op Vital signs reviewed and stable  Post vital signs: Reviewed and stable  Last Vitals:  Vitals Value Taken Time  BP 120/67 10/11/19 1615  Temp    Pulse 62 10/11/19 1617  Resp 18 10/11/19 1617  SpO2 100 % 10/11/19 1617  Vitals shown include unvalidated device data.  Last Pain:  Vitals:   10/11/19 1044  TempSrc:   PainSc: 0-No pain         Complications: No apparent anesthesia complications

## 2019-10-11 NOTE — Op Note (Signed)
PATIENT: Darin Smith  48 y.o. male  Patient Care Team: Debbrah Alar, NP as PCP - General (Internal Medicine)  PREOP DIAGNOSIS: Appendiceal mass  POSTOP DIAGNOSIS: Same  PROCEDURE: 1. Laparoscopic right hemicolectomy 2. Laparoscopic adhesiolysis x 70 minutes  SURGEON: Sharon Mt. Jayceion Lisenby, MD  ASSISTANT: Ralene Ok, MD  ANESTHESIA: General endotracheal  EBL: 30 mL Total I/O In: 1000 [I.V.:1000] Out: 32 [Urine:50]  DRAINS: None  SPECIMEN: Right colon, terminal ileum, appendix - all en bloc  COUNTS: Sponge, needle and instrument counts were reported correct x2  FINDINGS: Large burden of omental adhesions to abdominal wall, midline and left hemiabdomen. These had to be taken down to facilitate procedure as they were also tethering the transverse colon up. Liver also adherant to abdominal wall, occupying superior half of his prior incision. This was left in place and not taken down. Large mass on distal portion of appendix. Appendix was retrocecal and the mass was somewhat fixed in the retroperitoneum and quite firm; contained. The mass was not ruptured during the procedure and passed off intact with right colon. Standard ileocolic anastomosis fashioned.  STATEMENT OF MEDICAL NECESSITY: Mr. Isabell is a very pleasant 37yoM with hx of kidney stones who had left flank pain 08/15/2019. He was evaluated by [name], NP, who ordered a CT scan of the abdomen and pelvis with contrast. No definite acute abnormalities. He was found to have nonobstructing stones in the left kidney without hydronephrosis. He also is found to have a calcification in the left hemipelvis possibly phlebolith or nonobstructing stone. He was found to have a 6 cm cystic structure involving the distal appendix without periappendiceal inflammatory changes. There are also enlarged lymph nodes in the right lower quadrant. Differential includes mucocele versus less likely acute appendicitis in the  absence of right lower quadrant pain however less likely. Indeterminate 1 severe hypoattenuating segment 6 region which was indeterminate. He is subsequently referred to [place] for follow-up. He had no right sided abdominal pain.   He underwent colonoscopy with Dr. Ardis Hughs 09/12/2019 which demonstrated a smooth bulging inward of the mucosa at the appendiceal orifice. A single 2 mm polyp was found the descending colon and removed. Internal hemorrhoids.  We had met in the office and discussed his case in detail. We had discussed surgery to further assess this lesion and given enlarged ileocolic lymph nodes, discussed proceeding with right hemicolectomy. Please refer to notes elsewhere for all details regarding these discussions.  NARRATIVE:  The patient was identified & brought into the operating room, placed supine on the operating table and SCDs were applied to the lower extremities. General endotracheal anesthesia was induced. The patient was positioned supine with arms tucked. Antibiotics were administered. A foley catheter was placed under sterile conditions. Hair in the region of planned surgery was clipped. The abdomen was prepped and draped in a sterile fashion. A timeout was performed confirming our patient and plan.   Given his prior midline surgical history and having had a splenectomy, the decision was made to proceed with Veress needle entry.  In the right upper abdomen, a stab incision was created.  The Veress needle was inserted into the peritoneal cavity and intraperitoneal location confirmed using the aspiration and saline drop test.  Insufflation of CO2 commenced to maximum pressure 15 mmHg.  Using an Optiview 5 Miller trocar, intraperitoneal placement was obtained.  The laparoscope was inserted and demonstrated no evidence of Veress needle or trocar site complications.  He was found to have omental adhesions to  his anterior abdominal wall particularly on the left side crossing his midline.   His liver was also adherent to the abdominal wall and occupied the superior half of his prior midline incision.  Two 5 mm trochars were placed under direct visualization in the lower abdomen and delicate adhesio lysis was then taken out using scissors.  The omentum was carefully taken down.  Hemostasis was verified and the omentum was taken down.  The majority of the transverse colon had also been tented up towards abdominal wall due to the omental adhesions and this was gently mobilized down as well.  The omentum between the transverse colon was fixed to the underside of the liver as well as the gallbladder and this was also carefully taken down sharply.  The gallbladder was then inspected and noted to be free of any injury.  Ultimately 2 additional 5 mm trochars were placed for a total of five 5 mm ports.  The adhesiolysis process took approximately 70 minutes.  The stomach, small bowel, and colon were all inspected and there were no sites of injury identified.  Beginning with a lateral to medial approach, the cecum was retracted medially and the cecum and ascending colon were mobilized along the Beatriz Settles line of Toldt extending up towards the liver.  He was positioned in reverse Trendelenburg with some right side up.  As we approached the hepatic flexure, attachments were carefully taken down.  The duodenum was identified and carefully protected free of injury and swept "down."  In the retrocecal location, the appendix was identified extending up towards the liver and towards the distal portion appendix there was a large mass that was readily apparent.  This mass was firm and hard.  It was well seated in the retroperitoneum.  The appendix towards the cecum appeared normal and this was grasped for retraction purposes.  This mass was carefully dissected from the retroperitoneal tissues around it.  There was a pseudo-plane in this location.  Care was taken to avoid injuring any retroperitoneal structures including  the gonadal vessels or the right ureter.  After this was completely freed, attention was taken to takedown of the hepatic flexure.  He was repositioned in Trendelenburg.  The transverse colon was retracted caudad and the omentum retracted anteriorly.  The plane between the colon and the transverse omentum was opened.  This was then taken out to the level of the hepatic flexure.  The hepatic flexure was carefully taken down.  At this point the entire proximal third of the transverse colon as well as the hepatic flexure have been mobilized.  Attention was then turned to mobilizing the terminal ileum.  He was then repositioned in Trendelenburg.  The terminal ileum did have some adhesions into the pelvis likely from his prior surgery.  These were carefully lysed.  The terminal ileum was able to be reflected of the pelvis.  This was then carefully mobilized off the retroperitoneum.  At this point, the terminal ileum and appendix easily reached into the left upper quadrant.  The ileocolic pedicle was identified and retracted anteriorly.  The peritoneum overlying this was incised.  The avascular plane was developed cephalad to the ileocolic pedicle.  The duodenum was carefully reidentified.  This was well away from our field of dissection.  The ileocolic pedicle was then ligated using the Enseal device.  The pedicle was then inspected and noted to be hemostatic.  Attention was then turned to the extracorporeal portion of the procedure.  At the location of his  prior incision in the inferior half of it, an extraction incision was made.  This was carried down to the subcutaneous tissue and fascia is electrocautery.  Peritoneum was entered under pneumoperitoneum carefully.  The wound is opened.  A Alexis wound protector was then placed.  Blue towels were placed around the wound.  The specimen was then delivered onto the operative field.  Attention was turned to the proximal point of transection on the distal ileum.  A window  was created in the mesentery.  The distal ileum was then divided using a linear cutting 75 mm blue load stapler.  The distal point of transection was also identified and this was on the transverse colon at a location that included the right branch of the middle colic vessels.  The middle colic artery was inspected and noted to be intact with a palpable pulse.  The mesentery at the location that included the right branch of the middle colic was incised.  The transverse colon dislocation was then divided using a linear cutting 75 mm GIA blue load stapler.  The intervening mesentery was then divided using the Enseal device.  The specimen was passed off with the appendix intact.  The mesentery was inspected and noted to be hemostatic.  There was a palpable pulse in the transverse mesocolon.  The transverse mesocolon was pink and healthy in appearance.  The distal ileum was pink and healthy in appearance.  Attention was turned to creating the anastomosis.  The distal ileum and transverse colon were then aligned and orientation confirmed such that there is no twisting of the bowel or mesentery.  The corners of each respective staple line were trimmed and a anastomosis was created using a single firing of the 75 mm blue load GIA stapler.  The staple line was inspected and noted to be hemostatic.  The common enterotomy was then closed using a TA 60 blue load stapler after distracting the staple lines.  The staple lines were then inspected and hemostasis was achieved with 3-0 silk U stitches.  The anastomosis was palpated and noted to be widely patent.  A "crotch" stitch was placed at the apex anastomosis using a 3-0 silk suture.  The corners TA closure were then dunked with 3-0 silk suture.  Succus was then switched to and fro through the anastomosis and there is no evidence of any leakage.  This was then placed back into the abdomen.  The abdomen was then irrigated.  The trochars were removed under direct visualization  through the incision and noted to be hemostatic.  Attention was then turned to closing the abdomen. Sponge, needle and instrument counts were reported correct.  The wound protector was removed and all equipment exchanged for clean equipment, clean gown/gloves of all participants.  The fascia at the extraction incision was closed with 2 running #1 PDS sutures.  Additional anesthetic consisting of Exparel diluted with bupivacaine was infiltrated.  The wound was irrigated.  The midline skin was closed with staples.  The skin of all incision sites was then approximated with staples.  Sterile dressings were then placed over the incisions.  Sponge, needle and instrument counts were again reported correct.  He was then awakened from anesthesia, extubated, and transferred to a stretcher for transport to PACU in satisfactory condition  DISPOSITION: PACU in satisfactory condition

## 2019-10-12 ENCOUNTER — Encounter (HOSPITAL_COMMUNITY): Payer: Self-pay | Admitting: Surgery

## 2019-10-12 LAB — BASIC METABOLIC PANEL
Anion gap: 9 (ref 5–15)
BUN: 12 mg/dL (ref 6–20)
CO2: 21 mmol/L — ABNORMAL LOW (ref 22–32)
Calcium: 8.7 mg/dL — ABNORMAL LOW (ref 8.9–10.3)
Chloride: 105 mmol/L (ref 98–111)
Creatinine, Ser: 1.14 mg/dL (ref 0.61–1.24)
GFR calc Af Amer: >60 mL/min (ref >60)
GFR calc non Af Amer: >60 mL/min (ref >60)
Glucose, Bld: 115 mg/dL — ABNORMAL HIGH (ref 70–99)
Potassium: 4.5 mmol/L (ref 3.5–5.1)
Sodium: 135 mmol/L (ref 135–145)

## 2019-10-12 LAB — CBC
HCT: 46.9 % (ref 39.0–52.0)
Hemoglobin: 15.1 g/dL (ref 13.0–17.0)
MCH: 30 pg (ref 26.0–34.0)
MCHC: 32.2 g/dL (ref 30.0–36.0)
MCV: 93.1 fL (ref 80.0–100.0)
Platelets: 265 10*3/uL (ref 150–400)
RBC: 5.04 MIL/uL (ref 4.22–5.81)
RDW: 12.7 % (ref 11.5–15.5)
WBC: 11.7 10*3/uL — ABNORMAL HIGH (ref 4.0–10.5)
nRBC: 0 % (ref 0.0–0.2)

## 2019-10-12 MED ORDER — CHLORHEXIDINE GLUCONATE CLOTH 2 % EX PADS: 6.0000 | MEDICATED_PAD | Freq: Every day | CUTANEOUS | Status: DC

## 2019-10-12 NOTE — Progress Notes (Signed)
Subjective No acute events. Feeling well; has been up walking. Pain well controlled with tylenol. Denies n/v. Tolerating liquids. Denies flatus or BM yet.  Objective: Vital signs in last 24 hours: Temp:  [97.9 F (36.6 C)-98.8 F (37.1 C)] 98.2 F (36.8 C) (10/15 0619) Pulse Rate:  [60-73] 65 (10/15 0619) Resp:  [14-24] 18 (10/15 0619) BP: (120-163)/(67-98) 139/97 (10/15 0619) SpO2:  [98 %-100 %] 100 % (10/15 0619) Weight:  [99.1 kg] 99.1 kg (10/14 1044) Last BM Date: 10/10/19  Intake/Output from previous day: 10/14 0701 - 10/15 0700 In: 2730.8 [P.O.:600; I.V.:2030.8; IV Piggyback:100] Out: 3110 [Urine:3110] Intake/Output this shift: No intake/output data recorded.  Gen: NAD, comfortable CV: RRR Pulm: Normal work of breathing Abd: Soft, appropriately ttp around incision in midline; no tenderness elsewhere; dressings in place, clean/dry. Not significantly distended. No rebound/guarding. Ext: SCDs in place  Lab Results: CBC  Recent Labs    10/11/19 1100 10/12/19 0325  WBC 4.4 11.7*  HGB 15.8 15.1  HCT 47.4 46.9  PLT 265 265   BMET Recent Labs    10/11/19 1100 10/12/19 0325  NA 136 135  K 3.4* 4.5  CL 103 105  CO2 25 21*  GLUCOSE 127* 115*  BUN 11 12  CREATININE 1.10 1.14  CALCIUM 9.1 8.7*   PT/INR Recent Labs    10/11/19 1100  LABPROT 13.2  INR 1.0   ABG No results for input(s): PHART, HCO3 in the last 72 hours.  Invalid input(s): PCO2, PO2  Studies/Results:  Anti-infectives: Anti-infectives (From admission, onward)   Start     Dose/Rate Route Frequency Ordered Stop   10/11/19 1400  neomycin (MYCIFRADIN) tablet 1,000 mg  Status:  Discontinued     1,000 mg Oral 3 times per day 10/11/19 1019 10/11/19 1717   10/11/19 1400  metroNIDAZOLE (FLAGYL) tablet 1,000 mg  Status:  Discontinued     1,000 mg Oral 3 times per day 10/11/19 1019 10/11/19 1717   10/11/19 1030  cefoTEtan (CEFOTAN) 2 g in sodium chloride 0.9 % 100 mL IVPB     2 g 200 mL/hr over  30 Minutes Intravenous On call to O.R. 10/11/19 1019 10/11/19 1307       Assessment/Plan: Patient Active Problem List   Diagnosis Date Noted  . S/P right hemicolectomy 10/11/2019  . Tendonitis, Achilles, left 07/12/2019  . Allergic rhinitis 04/22/2016  . Routine general medical examination at a health care facility 09/24/2014  . HTN (hypertension) 09/24/2014  . Plantar fascia rupture 08/07/2013  . GERD (gastroesophageal reflux disease) 08/12/2012  . Elevated PSA 08/12/2012  . Preventative health care 08/04/2011  . NOCTURIA 05/14/2009  . 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 Dema Severin, M.D. [facility]

## 2019-10-12 NOTE — Anesthesia Postprocedure Evaluation (Signed)
Anesthesia Post Note  Patient: Darin Smith  Procedure(s) Performed: LAPAROSCOPIC RIGHT HEMICOLECTOMY (Right Abdomen)     Patient location during evaluation: PACU Anesthesia Type: General Level of consciousness: awake Pain management: pain level controlled Vital Signs Assessment: post-procedure vital signs reviewed and stable Respiratory status: spontaneous breathing Cardiovascular status: stable Postop Assessment: no apparent nausea or vomiting Anesthetic complications: no    Last Vitals:  Vitals:   10/12/19 0619 10/12/19 0931  BP: (!) 139/97 (!) 141/81  Pulse: 65 (!) 59  Resp: 18 17  Temp: 36.8 C 37 C  SpO2: 100% 100%    Last Pain:  Vitals:   10/12/19 0931  TempSrc: Oral  PainSc:                  [name]

## 2019-10-12 NOTE — Plan of Care (Signed)
Patient sitting up in bed; pain controlled at this time. No needs expressed. Will continue to monitor.

## 2019-10-13 LAB — BASIC METABOLIC PANEL
Anion gap: 8 (ref 5–15)
BUN: 15 mg/dL (ref 6–20)
CO2: 25 mmol/L (ref 22–32)
Calcium: 9 mg/dL (ref 8.9–10.3)
Chloride: 104 mmol/L (ref 98–111)
Creatinine, Ser: 1.18 mg/dL (ref 0.61–1.24)
GFR calc Af Amer: >60 mL/min (ref >60)
GFR calc non Af Amer: >60 mL/min (ref >60)
Glucose, Bld: 105 mg/dL — ABNORMAL HIGH (ref 70–99)
Potassium: 4.2 mmol/L (ref 3.5–5.1)
Sodium: 137 mmol/L (ref 135–145)

## 2019-10-13 LAB — CBC
HCT: 44.6 % (ref 39.0–52.0)
Hemoglobin: 14.8 g/dL (ref 13.0–17.0)
MCH: 31.2 pg (ref 26.0–34.0)
MCHC: 33.2 g/dL (ref 30.0–36.0)
MCV: 94.1 fL (ref 80.0–100.0)
Platelets: 283 10*3/uL (ref 150–400)
RBC: 4.74 MIL/uL (ref 4.22–5.81)
RDW: 13 % (ref 11.5–15.5)
WBC: 11.8 10*3/uL — ABNORMAL HIGH (ref 4.0–10.5)
nRBC: 0 % (ref 0.0–0.2)

## 2019-10-13 MED ORDER — TRAMADOL HCL 50 MG PO TABS: 50.0000 mg | ORAL_TABLET | Freq: Four times a day (QID) | ORAL | 0 refills | Status: AC | PRN

## 2019-10-13 NOTE — Progress Notes (Signed)
Subjective No acute events. Feeling well; has been up walking. Had 3 BMs. Denies n/v. Tolerating liquids - did report feeling quite bloated last night prior to having any return of bowel function but that resolved now  Objective: Vital signs in last 24 hours: Temp:  [98.3 F (36.8 C)-98.6 F (37 C)] 98.3 F (36.8 C) (10/16 0518) Pulse Rate:  [54-59] 57 (10/16 0518) Resp:  [17] 17 (10/16 0518) BP: (135-148)/(81-90) 135/82 (10/16 0518) SpO2:  [100 %] 100 % (10/16 0518) Weight:  [95 kg] 95 kg (10/16 0518) Last BM Date: 10/10/19  Intake/Output from previous day: 10/15 0701 - 10/16 0700 In: 960 [P.O.:960] Out: 1300 [Urine:1300] Intake/Output this shift: No intake/output data recorded.  Gen: NAD, comfortable CV: RRR Pulm: Normal work of breathing Abd: Soft, not significantly tender; not significantly distended; dressings in place, clean/dry.  Ext: SCDs in place  Lab Results: CBC  Recent Labs    10/12/19 0325 10/13/19 0612  WBC 11.7* 11.8*  HGB 15.1 14.8  HCT 46.9 44.6  PLT 265 283   BMET Recent Labs    10/12/19 0325 10/13/19 0612  NA 135 137  K 4.5 4.2  CL 105 104  CO2 21* 25  GLUCOSE 115* 105*  BUN 12 15  CREATININE 1.14 1.18  CALCIUM 8.7* 9.0   PT/INR Recent Labs    10/11/19 1100  LABPROT 13.2  INR 1.0   ABG No results for input(s): PHART, HCO3 in the last 72 hours.  Invalid input(s): PCO2, PO2  Studies/Results:  Anti-infectives: Anti-infectives (From admission, onward)   Start     Dose/Rate Route Frequency Ordered Stop   10/11/19 1400  neomycin (MYCIFRADIN) tablet 1,000 mg  Status:  Discontinued     1,000 mg Oral 3 times per day 10/11/19 1019 10/11/19 1717   10/11/19 1400  metroNIDAZOLE (FLAGYL) tablet 1,000 mg  Status:  Discontinued     1,000 mg Oral 3 times per day 10/11/19 1019 10/11/19 1717   10/11/19 1030  cefoTEtan (CEFOTAN) 2 g in sodium chloride 0.9 % 100 mL IVPB     2 g 200 mL/hr over 30 Minutes Intravenous On call to O.R. 10/11/19  1019 10/11/19 1307       Assessment/Plan: Patient Active Problem List   Diagnosis Date Noted  . S/P right hemicolectomy 10/11/2019  . Tendonitis, Achilles, left 07/12/2019  . Allergic rhinitis 04/22/2016  . Routine general medical examination at a health care facility 09/24/2014  . HTN (hypertension) 09/24/2014  . Plantar fascia rupture 08/07/2013  . GERD (gastroesophageal reflux disease) 08/12/2012  . Elevated PSA 08/12/2012  . Preventative health care 08/04/2011  . NOCTURIA 05/14/2009  . 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 Dema Severin, M.D. [facility]

## 2019-10-13 NOTE — Discharge Instructions (Signed)
POST OP INSTRUCTIONS AFTER COLON SURGERY  1. DIET: Be sure to include lots of fluids daily to stay hydrated - 64oz of water per day (8, 8 oz glasses).  Avoid fast food or heavy meals for the first couple of weeks as your are more likely to get nauseated. Avoid raw/uncooked fruits or vegetables for the first 4 weeks (its ok to have these if they are blended into smoothie form). If you have fruits/vegetables, make sure they are cooked until soft enough to mash on the roof of your mouth and chew your food well. Otherwise, diet as tolerated.  2. Take your usually prescribed home medications unless otherwise directed.  3. PAIN CONTROL: a. Pain is best controlled by a usual combination of three different methods TOGETHER: i. Ice/Heat ii. Over the counter pain medication iii. Prescription pain medication b. Most patients will experience some swelling and bruising around the surgical site.  Ice packs or heating pads (30-60 minutes up to 6 times a day) will help. Some people prefer to use ice alone, heat alone, alternating between ice & heat.  Experiment to what works for you.  Swelling and bruising can take several weeks to resolve.   c. It is helpful to take an over-the-counter pain medication regularly for the first few weeks: i. Ibuprofen (Motrin/Advil) - 200mg  tabs - take 3 tabs (600mg ) every 6 hours as needed for pain (unless you have been directed previously to avoid NSAIDs/ibuprofen) ii. Acetaminophen (Tylenol) - you may take 650mg  every 6 hours as needed. You can take this with motrin as they act differently on the body. If you are taking a narcotic pain medication that has acetaminophen in it, do not take over the counter tylenol at the same time. iii. NOTE: You may take both of these medications together - most patients  find it most helpful when alternating between the two (i.e. Ibuprofen at 6am, tylenol at 9am, ibuprofen at 12pm ...) d. A  prescription for pain medication should be given to you  upon discharge.  Take your pain medication as prescribed if your pain is not adequatly controlled with the over-the-counter pain reliefs mentioned above.  4. Avoid getting constipated.  Between the surgery and the pain medications, it is common to experience some constipation.  Increasing fluid intake and taking a fiber supplement (such as Metamucil, Citrucel, FiberCon, MiraLax, etc) 1-2 times a day regularly will usually help prevent this problem from occurring.  A mild laxative (prune juice, Milk of Magnesia, MiraLax, etc) should be taken according to package directions if there are no bowel movements after 48 hours.    5. Dressing: Your incisions are covered in Dermabond which is like sterile superglue for the skin. This will come off on it's own in a couple weeks. It is waterproof and you may bathe normally starting the day after your surgery in a shower. Avoid baths/pools/lakes/oceans until your wounds have fully healed. If you were discharged with a dressing, you may remove this on the day you arrive home.  6. ACTIVITIES as tolerated:   a. Avoid heavy lifting (>10lbs or 1 gallon of milk) for the next 6 weeks. b. You may resume regular daily activities as tolerated--such as daily self-care, walking, climbing stairs--gradually increasing activities as tolerated.  If you can walk 30 minutes without difficulty, it is safe to try more intense activity such as jogging, treadmill, bicycling, low-impact aerobics.  c. DO NOT PUSH THROUGH PAIN.  Let pain be your guide: If it hurts to do something,  don't do it. d. Dennis Bast may drive when you are no longer taking prescription pain medication, you can comfortably wear a seatbelt, and you can safely maneuver your car and apply brakes.  7. FOLLOW UP in our office a. Please call [facility] at [phone] to set up an appointment to see your surgeon in the office for a follow-up appointment approximately 2 weeks after your surgery. b. Make sure that you call for this  appointment the day you arrive home to insure a convenient appointment time.  9. If you have disability or family leave forms that need to be completed, you may have them completed by your primary care physician's office; for return to work instructions, please ask our office staff and they will be happy to assist you in obtaining this documentation   When to call us [phone]: 1. Poor pain control 2. Reactions / problems with new medications (rash/itching, etc)  3. Fever over 101.5 F (38.5 C) 4. Inability to urinate 5. Nausea/vomiting 6. Worsening swelling or bruising 7. Continued bleeding from incision. 8. Increased pain, redness, or drainage from the incision  The clinic staff is available to answer your questions during regular business hours (8:30am-5pm).  Please dont hesitate to call and ask to speak to one of our nurses for clinical concerns.   A surgeon from [facility] is always on call at the hospitals   If you have a medical emergency, go to the nearest emergency room or call 911.  [facility], [place] [address], [place], [place], [place]  [address] MAIN: [phone] FAX: [phone] [email or web address]

## 2019-10-14 LAB — BASIC METABOLIC PANEL
Anion gap: 9 (ref 5–15)
BUN: 17 mg/dL (ref 6–20)
CO2: 25 mmol/L (ref 22–32)
Calcium: 8.9 mg/dL (ref 8.9–10.3)
Chloride: 102 mmol/L (ref 98–111)
Creatinine, Ser: 1.11 mg/dL (ref 0.61–1.24)
GFR calc Af Amer: >60 mL/min (ref >60)
GFR calc non Af Amer: >60 mL/min (ref >60)
Glucose, Bld: 98 mg/dL (ref 70–99)
Potassium: 3.8 mmol/L (ref 3.5–5.1)
Sodium: 136 mmol/L (ref 135–145)

## 2019-10-14 LAB — CBC
HCT: 43.6 % (ref 39.0–52.0)
Hemoglobin: 14.2 g/dL (ref 13.0–17.0)
MCH: 30.7 pg (ref 26.0–34.0)
MCHC: 32.6 g/dL (ref 30.0–36.0)
MCV: 94.4 fL (ref 80.0–100.0)
Platelets: 251 10*3/uL (ref 150–400)
RBC: 4.62 MIL/uL (ref 4.22–5.81)
RDW: 12.9 % (ref 11.5–15.5)
WBC: 8.5 10*3/uL (ref 4.0–10.5)
nRBC: 0 % (ref 0.0–0.2)

## 2019-10-14 NOTE — Progress Notes (Signed)
Nurse reviewed discharge instructions with pt.  Pt verbalized understanding of discharge instructions, follow up appointments and new medications.  No concerns at time of discharge. 

## 2019-10-14 NOTE — Discharge Summary (Signed)
Patient ID: NNAMDI DREWS MRN: [id number] DOB/AGE: 01/15/1971 48 y.o.  Admit date: 10/11/2019 Discharge date: 10/14/2019  Discharge Diagnoses Patient Active Problem List   Diagnosis Date Noted  . S/P right hemicolectomy 10/11/2019  . Tendonitis, Achilles, left 07/12/2019  . Allergic rhinitis 04/22/2016  . Routine general medical examination at a health care facility 09/24/2014  . HTN (hypertension) 09/24/2014  . Plantar fascia rupture 08/07/2013  . GERD (gastroesophageal reflux disease) 08/12/2012  . Elevated PSA 08/12/2012  . Preventative health care 08/04/2011  . NOCTURIA 05/14/2009  . 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 His diet was gradually advanced and he did well. On 10/14/2019 he continued to have bowel function, was tolerating a regular diet, pain controlled on oral analgesics and overall feeling well. He was deemed stable for discharge home  AF VSS NAD, comfortable RRR Abdomen is soft, NT/ND; incisions c/d/i without erythema or drainage  WBC normalized  Allergies as of 10/14/2019   No Known Allergies     Medication List    STOP taking these medications   oxyCODONE-acetaminophen 5-325 MG tablet Commonly known as: PERCOCET/ROXICET     TAKE these medications   amLODipine 10 MG tablet Commonly known as: NORVASC TAKE 1 TABLET BY MOUTH EVERY DAY   cetirizine 10 MG tablet Commonly known as: ZYRTEC Take 10 mg by mouth daily.   diclofenac 75 MG EC tablet Commonly known as: VOLTAREN Take 1 tablet (75 mg total) by mouth 2 (two) times daily.   metoprolol succinate 50 MG 24 hr tablet Commonly known as: TOPROL-XL TAKE 1 TABLET (50 MG TOTAL) BY MOUTH DAILY. TAKE WITH OR IMMEDIATELY FOLLOWING A MEAL.   Myrbetriq 25 MG Tb24 tablet Generic drug: mirabegron ER Take 25 mg by mouth daily.    omeprazole 20 MG capsule Commonly known as: PRILOSEC Take 1 capsule (20 mg total) by mouth daily.   traMADol 50 MG tablet Commonly known as: Ultram Take 1 tablet (50 mg total) by mouth every 6 (six) hours as needed for up to 5 days (severe postop pain not controlled with tylenol and ibuprofen).         Sharon Mt. Dema Severin, M.D. [facility]

## 2019-10-16 LAB — SURGICAL PATHOLOGY

## 2019-10-18 ENCOUNTER — Other Ambulatory Visit: Payer: Self-pay | Admitting: Family

## 2019-10-21 ENCOUNTER — Other Ambulatory Visit: Payer: Self-pay

## 2019-10-21 ENCOUNTER — Emergency Department (HOSPITAL_COMMUNITY): Payer: BC Managed Care – PPO

## 2019-10-21 ENCOUNTER — Telehealth: Payer: Self-pay | Admitting: General Surgery

## 2019-10-21 ENCOUNTER — Inpatient Hospital Stay (HOSPITAL_COMMUNITY): Payer: BC Managed Care – PPO

## 2019-10-21 ENCOUNTER — Encounter (HOSPITAL_COMMUNITY): Payer: Self-pay

## 2019-10-21 ENCOUNTER — Inpatient Hospital Stay (HOSPITAL_COMMUNITY)
Admission: EM | Admit: 2019-10-21 | Discharge: 2019-10-25 | DRG: 394 | Disposition: A | Discharge status: Home / Self Care | Payer: BC Managed Care – PPO | Attending: Surgery | Admitting: Surgery

## 2019-10-21 DIAGNOSIS — R066 Hiccough: Secondary | ICD-10-CM | POA: Diagnosis present

## 2019-10-21 DIAGNOSIS — Z79899 Other long term (current) drug therapy: Secondary | ICD-10-CM

## 2019-10-21 DIAGNOSIS — Z8249 Family history of ischemic heart disease and other diseases of the circulatory system: Secondary | ICD-10-CM | POA: Diagnosis not present

## 2019-10-21 DIAGNOSIS — Z91048 Other nonmedicinal substance allergy status: Secondary | ICD-10-CM | POA: Diagnosis not present

## 2019-10-21 DIAGNOSIS — K9189 Other postprocedural complications and disorders of digestive system: Secondary | ICD-10-CM | POA: Diagnosis not present

## 2019-10-21 DIAGNOSIS — Z9081 Acquired absence of spleen: Secondary | ICD-10-CM | POA: Diagnosis not present

## 2019-10-21 DIAGNOSIS — F17290 Nicotine dependence, other tobacco product, uncomplicated: Secondary | ICD-10-CM | POA: Diagnosis present

## 2019-10-21 DIAGNOSIS — K567 Ileus, unspecified: Secondary | ICD-10-CM

## 2019-10-21 DIAGNOSIS — K219 Gastro-esophageal reflux disease without esophagitis: Secondary | ICD-10-CM | POA: Diagnosis present

## 2019-10-21 DIAGNOSIS — Z86010 Personal history of colonic polyps: Secondary | ICD-10-CM | POA: Diagnosis not present

## 2019-10-21 DIAGNOSIS — I10 Essential (primary) hypertension: Secondary | ICD-10-CM | POA: Diagnosis present

## 2019-10-21 DIAGNOSIS — K56600 Partial intestinal obstruction, unspecified as to cause: Secondary | ICD-10-CM | POA: Diagnosis present

## 2019-10-21 DIAGNOSIS — D490 Neoplasm of unspecified behavior of digestive system: Secondary | ICD-10-CM | POA: Diagnosis present

## 2019-10-21 DIAGNOSIS — Z20828 Contact with and (suspected) exposure to other viral communicable diseases: Secondary | ICD-10-CM | POA: Diagnosis present

## 2019-10-21 DIAGNOSIS — Z4659 Encounter for fitting and adjustment of other gastrointestinal appliance and device: Secondary | ICD-10-CM

## 2019-10-21 HISTORY — DX: Ileus, unspecified: K56.7

## 2019-10-21 LAB — URINALYSIS, ROUTINE W REFLEX MICROSCOPIC
Bilirubin Urine: NEGATIVE
Glucose, UA: NEGATIVE mg/dL
Hgb urine dipstick: NEGATIVE
Ketones, ur: 5 mg/dL — AB
Leukocytes,Ua: NEGATIVE
Nitrite: NEGATIVE
Protein, ur: NEGATIVE mg/dL
Specific Gravity, Urine: 1.04 — ABNORMAL HIGH (ref 1.005–1.030)
pH: 6 (ref 5.0–8.0)

## 2019-10-21 LAB — CBC
HCT: 45.6 % (ref 39.0–52.0)
HCT: 42.8 % (ref 39.0–52.0)
Hemoglobin: 15.1 g/dL (ref 13.0–17.0)
Hemoglobin: 14.2 g/dL (ref 13.0–17.0)
MCH: 30.6 pg (ref 26.0–34.0)
MCH: 31.3 pg (ref 26.0–34.0)
MCHC: 33.1 g/dL (ref 30.0–36.0)
MCHC: 33.2 g/dL (ref 30.0–36.0)
MCV: 92.5 fL (ref 80.0–100.0)
MCV: 94.3 fL (ref 80.0–100.0)
Platelets: 489 10*3/uL — ABNORMAL HIGH (ref 150–400)
Platelets: 450 10*3/uL — ABNORMAL HIGH (ref 150–400)
RBC: 4.93 MIL/uL (ref 4.22–5.81)
RBC: 4.54 MIL/uL (ref 4.22–5.81)
RDW: 12.7 % (ref 11.5–15.5)
RDW: 12.6 % (ref 11.5–15.5)
WBC: 12.7 10*3/uL — ABNORMAL HIGH (ref 4.0–10.5)
WBC: 12.2 10*3/uL — ABNORMAL HIGH (ref 4.0–10.5)
nRBC: 0 % (ref 0.0–0.2)
nRBC: 0 % (ref 0.0–0.2)

## 2019-10-21 LAB — LACTIC ACID, PLASMA: Lactic Acid, Venous: 1.2 mmol/L (ref 0.5–1.9)

## 2019-10-21 LAB — COMPREHENSIVE METABOLIC PANEL
ALT: 68 U/L — ABNORMAL HIGH (ref 0–44)
AST: 25 U/L (ref 15–41)
Albumin: 4.5 g/dL (ref 3.5–5.0)
Alkaline Phosphatase: 98 U/L (ref 38–126)
Anion gap: 12 (ref 5–15)
BUN: 15 mg/dL (ref 6–20)
CO2: 31 mmol/L (ref 22–32)
Calcium: 10.4 mg/dL — ABNORMAL HIGH (ref 8.9–10.3)
Chloride: 93 mmol/L — ABNORMAL LOW (ref 98–111)
Creatinine, Ser: 1.09 mg/dL (ref 0.61–1.24)
GFR calc Af Amer: >60 mL/min (ref >60)
GFR calc non Af Amer: >60 mL/min (ref >60)
Glucose, Bld: 120 mg/dL — ABNORMAL HIGH (ref 70–99)
Potassium: 4.6 mmol/L (ref 3.5–5.1)
Sodium: 136 mmol/L (ref 135–145)
Total Bilirubin: 0.8 mg/dL (ref 0.3–1.2)
Total Protein: 8.3 g/dL — ABNORMAL HIGH (ref 6.5–8.1)

## 2019-10-21 LAB — LIPASE, BLOOD: Lipase: 17 U/L (ref 11–51)

## 2019-10-21 LAB — CREATININE, SERUM
Creatinine, Ser: 1.09 mg/dL (ref 0.61–1.24)
GFR calc Af Amer: >60 mL/min (ref >60)
GFR calc non Af Amer: >60 mL/min (ref >60)

## 2019-10-21 MED ORDER — AMLODIPINE BESYLATE 10 MG PO TABS: 10.0000 mg | ORAL_TABLET | Freq: Every day | ORAL | Status: DC

## 2019-10-21 MED ORDER — PHENOL 1.4 % MT LIQD
1.0000 | OROMUCOSAL | Status: DC | PRN
  Administered 2019-10-21: 22:00:00 1 via OROMUCOSAL
  Filled 2019-10-21 (×2): qty 177

## 2019-10-21 MED ORDER — DIPHENHYDRAMINE HCL 25 MG PO CAPS: 25.0000 mg | ORAL_CAPSULE | Freq: Four times a day (QID) | ORAL | Status: DC | PRN

## 2019-10-21 MED ORDER — METOPROLOL SUCCINATE ER 50 MG PO TB24: 50.0000 mg | ORAL_TABLET | Freq: Every day | ORAL | Status: DC

## 2019-10-21 MED ORDER — METHOCARBAMOL 1000 MG/10ML IJ SOLN
500.0000 mg | Freq: Three times a day (TID) | INTRAVENOUS | Status: DC | PRN
  Administered 2019-10-21: 500 mg via INTRAVENOUS
  Filled 2019-10-21: qty 5
  Filled 2019-10-21: qty 500

## 2019-10-21 MED ORDER — SODIUM CHLORIDE 0.9 % IV BOLUS: 500.0000 mL | Freq: Once | INTRAVENOUS | Status: DC

## 2019-10-21 MED ORDER — ZOLPIDEM TARTRATE 5 MG PO TABS: 5.0000 mg | ORAL_TABLET | Freq: Every evening | ORAL | Status: DC | PRN

## 2019-10-21 MED ORDER — PANTOPRAZOLE SODIUM 40 MG IV SOLR
40.0000 mg | Freq: Every day | INTRAVENOUS | Status: DC
  Administered 2019-10-21 – 2019-10-24 (×4): 40 mg via INTRAVENOUS
  Filled 2019-10-21 (×4): qty 40

## 2019-10-21 MED ORDER — KCL IN DEXTROSE-NACL 20-5-0.45 MEQ/L-%-% IV SOLN
INTRAVENOUS | Status: DC
  Administered 2019-10-21 – 2019-10-25 (×7): via INTRAVENOUS
  Filled 2019-10-21 (×10): qty 1000

## 2019-10-21 MED ORDER — METOPROLOL TARTRATE 5 MG/5ML IV SOLN: 5.0000 mg | Freq: Four times a day (QID) | INTRAVENOUS | Status: DC | PRN

## 2019-10-21 MED ORDER — ONDANSETRON 4 MG PO TBDP: 4.0000 mg | ORAL_TABLET | Freq: Four times a day (QID) | ORAL | Status: DC | PRN

## 2019-10-21 MED ORDER — MENTHOL 3 MG MT LOZG
1.0000 | LOZENGE | OROMUCOSAL | Status: DC | PRN
  Administered 2019-10-21 – 2019-10-22 (×2): 3 mg via ORAL
  Filled 2019-10-21 (×2): qty 9

## 2019-10-21 MED ORDER — ACETAMINOPHEN 650 MG RE SUPP: 650.0000 mg | Freq: Four times a day (QID) | RECTAL | Status: DC | PRN

## 2019-10-21 MED ORDER — DIPHENHYDRAMINE HCL 50 MG/ML IJ SOLN: 25.0000 mg | Freq: Four times a day (QID) | INTRAMUSCULAR | Status: DC | PRN

## 2019-10-21 MED ORDER — ALUM & MAG HYDROXIDE-SIMETH 200-200-20 MG/5ML PO SUSP: 30.0000 mL | Freq: Four times a day (QID) | ORAL | Status: DC | PRN

## 2019-10-21 MED ORDER — SODIUM CHLORIDE 0.9 % IV BOLUS
1000.0000 mL | Freq: Once | INTRAVENOUS | Status: AC
  Administered 2019-10-21: 12:00:00 1000 mL via INTRAVENOUS

## 2019-10-21 MED ORDER — ONDANSETRON HCL 4 MG/2ML IJ SOLN: 4.0000 mg | Freq: Four times a day (QID) | INTRAMUSCULAR | Status: DC | PRN

## 2019-10-21 MED ORDER — LORATADINE 10 MG PO TABS
10.0000 mg | ORAL_TABLET | Freq: Every day | ORAL | Status: DC
  Administered 2019-10-22 – 2019-10-23 (×2): 10 mg via ORAL
  Filled 2019-10-21 (×2): qty 1

## 2019-10-21 MED ORDER — ACETAMINOPHEN 325 MG PO TABS: 650.0000 mg | ORAL_TABLET | Freq: Four times a day (QID) | ORAL | Status: DC | PRN

## 2019-10-21 MED ORDER — METHOCARBAMOL 500 MG PO TABS: 500.0000 mg | ORAL_TABLET | Freq: Four times a day (QID) | ORAL | Status: DC | PRN

## 2019-10-21 MED ORDER — LIDOCAINE HCL URETHRAL/MUCOSAL 2 % EX GEL
CUTANEOUS | Status: AC
  Filled 2019-10-21: qty 30

## 2019-10-21 MED ORDER — ENOXAPARIN SODIUM 40 MG/0.4ML ~~LOC~~ SOLN
40.0000 mg | SUBCUTANEOUS | Status: DC
  Administered 2019-10-21 – 2019-10-23 (×3): 40 mg via SUBCUTANEOUS
  Filled 2019-10-21 (×3): qty 0.4

## 2019-10-21 MED ORDER — ONDANSETRON HCL 4 MG/2ML IJ SOLN
4.0000 mg | Freq: Once | INTRAMUSCULAR | Status: AC
  Administered 2019-10-21: 14:00:00 4 mg via INTRAVENOUS
  Filled 2019-10-21: qty 2

## 2019-10-21 MED ORDER — SODIUM CHLORIDE (PF) 0.9 % IJ SOLN
INTRAMUSCULAR | Status: AC
  Filled 2019-10-21: qty 50

## 2019-10-21 MED ORDER — MORPHINE SULFATE (PF) 2 MG/ML IV SOLN
1.0000 mg | INTRAVENOUS | Status: DC | PRN
  Administered 2019-10-22: 15:00:00 2 mg via INTRAVENOUS
  Administered 2019-10-22: 11:00:00 1 mg via INTRAVENOUS
  Administered 2019-10-22 – 2019-10-23 (×2): 2 mg via INTRAVENOUS
  Filled 2019-10-21 (×4): qty 1

## 2019-10-21 MED ORDER — AMLODIPINE BESYLATE 10 MG PO TABS
10.0000 mg | ORAL_TABLET | Freq: Every day | ORAL | Status: DC
  Administered 2019-10-22 – 2019-10-23 (×2): 10 mg via ORAL
  Filled 2019-10-21 (×2): qty 1

## 2019-10-21 MED ORDER — SODIUM CHLORIDE 0.9 % IV SOLN
12.5000 mg | Freq: Four times a day (QID) | INTRAVENOUS | Status: DC | PRN
  Administered 2019-10-21: 21:00:00 12.5 mg via INTRAVENOUS
  Filled 2019-10-21 (×5): qty 0.5

## 2019-10-21 MED ORDER — MIRABEGRON ER 25 MG PO TB24
25.0000 mg | ORAL_TABLET | Freq: Every day | ORAL | Status: DC
  Administered 2019-10-22 – 2019-10-24 (×3): 25 mg via ORAL
  Filled 2019-10-21 (×4): qty 1

## 2019-10-21 MED ORDER — ORAL CARE MOUTH RINSE
15.0000 mL | Freq: Two times a day (BID) | OROMUCOSAL | Status: DC
  Administered 2019-10-21 – 2019-10-24 (×6): 15 mL via OROMUCOSAL

## 2019-10-21 MED ORDER — MORPHINE SULFATE (PF) 4 MG/ML IV SOLN
4.0000 mg | Freq: Once | INTRAVENOUS | Status: AC
  Administered 2019-10-21: 12:00:00 4 mg via INTRAVENOUS
  Filled 2019-10-21: qty 1

## 2019-10-21 MED ORDER — LIDOCAINE HCL (CARDIAC) PF 100 MG/5ML IV SOSY
PREFILLED_SYRINGE | INTRAVENOUS | Status: AC
  Filled 2019-10-21: qty 5

## 2019-10-21 MED ORDER — FAMOTIDINE IN NACL 20-0.9 MG/50ML-% IV SOLN
20.0000 mg | Freq: Once | INTRAVENOUS | Status: AC
  Administered 2019-10-21: 14:00:00 20 mg via INTRAVENOUS
  Filled 2019-10-21: qty 50

## 2019-10-21 MED ORDER — METOPROLOL SUCCINATE ER 50 MG PO TB24
50.0000 mg | ORAL_TABLET | Freq: Every day | ORAL | Status: DC
  Administered 2019-10-22 – 2019-10-23 (×2): 50 mg via ORAL
  Filled 2019-10-21 (×2): qty 1

## 2019-10-21 MED ORDER — IOHEXOL 300 MG/ML  SOLN
100.0000 mL | Freq: Once | INTRAMUSCULAR | Status: AC | PRN
  Administered 2019-10-21: 12:00:00 100 mL via INTRAVENOUS

## 2019-10-21 NOTE — Telephone Encounter (Signed)
Got a call from wife.    Pt called from ED waiting room.    Pt very constipated/distended.  They tried enema and suppository with no results.   Encouraged to wait as he does need imaging to determine next steps.    Will try to see if charge nurse can arrange for labs to be drawn while he is waiting.

## 2019-10-21 NOTE — ED Provider Notes (Signed)
[facility] Provider Note   CSN: [id number] Arrival date & time: 10/21/19  [id number]     History   Chief Complaint Chief Complaint  Patient presents with   Post-op Problem    HPI Darin Smith is a 48 y.o. male with history of hypertension, recent right hemicolectomy who presents with abdominal pain, distention, and reflux.  Patient has also had constipation and decreased flatus.  He denies any nausea or vomiting, fever, chest pain, shortness of breath.  Patient had surgery 10 days ago and had been doing well regarding his recovery, but over the last 3 days he developed the above symptoms.     HPI  Past Medical History:  Diagnosis Date   Allergy    Diverticulitis    GERD (gastroesophageal reflux disease)    History of colon polyps    Hypertension    OAB (overactive bladder)    Rupture spleen age 89   hx of abdominal trauma   Seasonal allergies     Patient Active Problem List   Diagnosis Date Noted   Ileus following gastrointestinal surgery ([place]) 10/21/2019   S/P right hemicolectomy 10/11/2019   Tendonitis, Achilles, left 07/12/2019   Allergic rhinitis 04/22/2016   Routine general medical examination at a health care facility 09/24/2014   HTN (hypertension) 09/24/2014   Plantar fascia rupture 08/07/2013   GERD (gastroesophageal reflux disease) 08/12/2012   Elevated PSA 08/12/2012   Preventative health care 08/04/2011   NOCTURIA 05/14/2009   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 Lowella Bandy- negative   COLONOSCOPY  09/12/2019   LAPAROSCOPIC PARTIAL COLECTOMY Right 10/11/2019   Procedure: LAPAROSCOPIC RIGHT HEMICOLECTOMY;  Surgeon: Ileana Roup, MD;  Location: [facility];  Service: General;  Laterality: Right;   SPLENECTOMY          Home Medications    Prior to Admission medications   Medication Sig Start Date End  Date Taking? Authorizing Provider  amLODipine (NORVASC) 10 MG tablet TAKE 1 TABLET BY MOUTH EVERY DAY Patient taking differently: Take 10 mg by mouth daily.  06/12/19  Yes Debbrah Alar, NP  cetirizine (ZYRTEC) 10 MG tablet Take 10 mg by mouth daily.    Yes [provider]  metoprolol succinate (TOPROL-XL) 50 MG 24 hr tablet TAKE 1 TABLET (50 MG TOTAL) BY MOUTH DAILY. TAKE WITH OR IMMEDIATELY FOLLOWING A MEAL. 10/18/19  Yes Debbrah Alar, NP  mirabegron ER (MYRBETRIQ) 25 MG TB24 tablet Take 25 mg by mouth daily.   Yes [provider]  omeprazole (PRILOSEC) 20 MG capsule TAKE 1 CAPSULE BY MOUTH EVERY DAY 10/18/19  Yes Debbrah Alar, NP  diclofenac (VOLTAREN) 75 MG EC tablet Take 1 tablet (75 mg total) by mouth 2 (two) times daily. Patient not taking: Reported on 08/31/2019 04/25/19   Dene Gentry, MD    Family History Family History  Problem Relation Age of Onset   Lupus Mother    Vision loss Mother        in left eye   Hypertension Father    COPD Father    Obesity Sister    Thyroid disease Sister    Multiple sclerosis Sister    Cancer Neg Hx        prostate   Colon cancer Neg Hx    Colon polyps Neg Hx    Esophageal cancer Neg Hx    Rectal cancer Neg  Hx    Stomach cancer Neg Hx     Social History Social History   Tobacco Use   Smoking status: Current Every Day Smoker    Types: Cigars   Smokeless tobacco: Never Used   Tobacco comment: 1-2 cigar a day  Substance Use Topics   Alcohol use: Yes    Alcohol/week: 1.0 standard drinks    Types: 1 Shots of liquor per week    Comment: daily   Drug use: No     Allergies   Patient has no known allergies.   Review of Systems Review of Systems  Constitutional: Negative for chills and fever.  HENT: Negative for facial swelling and sore throat.   Respiratory: Negative for shortness of breath.   Cardiovascular: Negative for chest pain.  Gastrointestinal: Positive for  abdominal distention, abdominal pain and constipation. Negative for diarrhea, nausea and vomiting.  Genitourinary: Negative for dysuria.  Musculoskeletal: Negative for back pain.  Skin: Negative for rash and wound.  Neurological: Negative for headaches.  Psychiatric/Behavioral: The patient is not nervous/anxious.      Physical Exam Updated Vital Signs BP (!) 153/91 (BP Location: Left Arm)    Pulse 63    Temp 98.9 F (37.2 C) (Oral)    Resp 15    Wt 93.8 kg    SpO2 100%    BMI 26.55 kg/m   Physical Exam Vitals signs and nursing note reviewed.  Constitutional:      General: He is not in acute distress.    Appearance: He is well-developed. He is not diaphoretic.  HENT:     Head: Normocephalic and atraumatic.     Mouth/Throat:     Pharynx: No oropharyngeal exudate.  Eyes:     General: No scleral icterus.       Right eye: No discharge.        Left eye: No discharge.     Conjunctiva/sclera: Conjunctivae normal.     Pupils: Pupils are equal, round, and reactive to light.  Neck:     Musculoskeletal: Normal range of motion and neck supple.     Thyroid: No thyromegaly.  Cardiovascular:     Rate and Rhythm: Normal rate and regular rhythm.     Heart sounds: Normal heart sounds. No murmur. No friction rub. No gallop.   Pulmonary:     Effort: Pulmonary effort is normal. No respiratory distress.     Breath sounds: Normal breath sounds. No stridor. No wheezing or rales.  Abdominal:     General: Bowel sounds are normal. There is distension.     Palpations: Abdomen is soft.     Tenderness: There is abdominal tenderness. There is no guarding or rebound.     Comments: Surgical wounds with staples in place, well appearing, no erythema or drainage  Lymphadenopathy:     Cervical: No cervical adenopathy.  Skin:    General: Skin is warm and dry.     Coloration: Skin is not pale.     Findings: No rash.  Neurological:     Mental Status: He is alert.     Coordination: Coordination normal.       ED Treatments / Results  Labs (all labs ordered are listed, but only abnormal results are displayed) Labs Reviewed  COMPREHENSIVE METABOLIC PANEL - Abnormal; Notable for the following components:      Result Value   Chloride 93 (*)    Glucose, Bld 120 (*)    Calcium 10.4 (*)    Total Protein 8.3 (*)  ALT 68 (*)    All other components within normal limits  CBC - Abnormal; Notable for the following components:   WBC 12.7 (*)    Platelets 489 (*)    All other components within normal limits  SARS CORONAVIRUS 2 (TAT 6-24 HRS)  LIPASE, BLOOD  LACTIC ACID, PLASMA  URINALYSIS, ROUTINE W REFLEX MICROSCOPIC  HIV ANTIBODY (ROUTINE TESTING W REFLEX)  CBC  CREATININE, SERUM    EKG None  Radiology Ct Abdomen Pelvis W Contrast  Result Date: 10/21/2019 CLINICAL DATA:  Postop pain, distension.  Right partial colectomy. EXAM: CT ABDOMEN AND PELVIS WITH CONTRAST TECHNIQUE: Multidetector CT imaging of the abdomen and pelvis was performed using the standard protocol following bolus administration of intravenous contrast. CONTRAST:  146mL OMNIPAQUE IOHEXOL 300 MG/ML  SOLN COMPARISON:  08/15/2019 FINDINGS: Lower chest: Mild bibasilar atelectasis. Hepatobiliary: No focal liver abnormality is seen. No gallstones, gallbladder wall thickening, or biliary dilatation. Pancreas: Unremarkable. No pancreatic ductal dilatation or surrounding inflammatory changes. Spleen: Normal in size without focal abnormality. Adrenals/Urinary Tract: Normal adrenal glands. Punctate nonobstructing left renal calculus. No obstructive uropathy. Normal bladder. Stomach/Bowel: Interval right colectomy. Fecal material at the enterocolic anastomosis. Severe dilatation of the small bowel with multiple air-fluid levels with the small bowel measuring up to 3.8 cm. Mild gaseous distension of the colon. Mild gastric distension which is fluid-filled. No pneumatosis, pneumoperitoneum or portal venous gas. Vascular/Lymphatic: No  significant vascular findings are present. No enlarged abdominal or pelvic lymph nodes. Reproductive: Prostate is unremarkable. Other: No ascites.  No focal fluid collection. Musculoskeletal: No acute osseous abnormality. No aggressive osseous lesion. Anterior bridging osteophytes of bilateral SI joints. IMPRESSION: 1. Interval right colectomy. Feces like material enterocolic anastomosis. Severe dilatation of the small bowel with multiple air-fluid levels measuring up to 3.8 cm in diameter and distension of the stomach. Differential considerations include partial small bowel obstruction versus an ileus. Electronically Signed   By: Kathreen Devoid   On: 10/21/2019 13:09    Procedures Procedures (including critical care time)  Medications Ordered in ED Medications  sodium chloride (PF) 0.9 % injection (has no administration in time range)  enoxaparin (LOVENOX) injection 40 mg (has no administration in time range)  dextrose 5 % and 0.45 % NaCl with KCl 20 mEq/L infusion (has no administration in time range)  acetaminophen (TYLENOL) tablet 650 mg (has no administration in time range)    Or  acetaminophen (TYLENOL) suppository 650 mg (has no administration in time range)  morphine 2 MG/ML injection 1-2 mg (has no administration in time range)  methocarbamol (ROBAXIN) tablet 500 mg (has no administration in time range)  methocarbamol (ROBAXIN) 500 mg in dextrose 5 % 50 mL IVPB (has no administration in time range)  zolpidem (AMBIEN) tablet 5 mg (has no administration in time range)  diphenhydrAMINE (BENADRYL) capsule 25 mg (has no administration in time range)    Or  diphenhydrAMINE (BENADRYL) injection 25 mg (has no administration in time range)  ondansetron (ZOFRAN-ODT) disintegrating tablet 4 mg (has no administration in time range)    Or  ondansetron (ZOFRAN) injection 4 mg (has no administration in time range)  pantoprazole (PROTONIX) injection 40 mg (has no administration in time range)    metoprolol tartrate (LOPRESSOR) injection 5 mg (has no administration in time range)  lidocaine (XYLOCAINE) 2 % jelly (has no administration in time range)  morphine 4 MG/ML injection 4 mg (4 mg Intravenous Given 10/21/19 1212)  sodium chloride 0.9 % bolus 1,000 mL (1,000 mLs Intravenous  Transfusing/Transfer 10/21/19 1501)  iohexol (OMNIPAQUE) 300 MG/ML solution 100 mL (100 mLs Intravenous Contrast Given 10/21/19 1224)  ondansetron (ZOFRAN) injection 4 mg (4 mg Intravenous Given 10/21/19 1355)  famotidine (PEPCID) IVPB 20 mg premix (0 mg Intravenous Stopped 10/21/19 1501)     Initial Impression / Assessment and Plan / ED Course  I have reviewed the triage vital signs and the nursing notes.  Pertinent labs & imaging results that were available during my care of the patient were reviewed by me and considered in my medical decision making (see chart for details).        Patient with recent right hemicolectomy presenting with abdominal pain, distention, and decreased BM.  Patient found to have partial small bowel obstruction versus ileus.  Mildly elevated leukocytosis at 12,000.  NG tube placed.  I discussed patient case with general surgeon on-call, Dr. Ovid Curd, who will admit to the general surgery service.  I appreciate their assistance with the patient. I discussed patient case with Dr. Ronnald Nian who guided the patient's management and agrees with plan.   Final Clinical Impressions(s) / ED Diagnoses   Final diagnoses:  Ileus following gastrointestinal surgery [facility])    ED Discharge Orders    None       Frederica Kuster, PA-C 10/21/19 1518    Lennice Sites, DO 10/21/19 1525

## 2019-10-21 NOTE — ED Triage Notes (Signed)
Pt had appendix and part of right colon removed last week. Pt states that he has been in pain the last 2 days, and has been unable to have a BM for 2 days.  Pt c/o lower abd pain. Denies emesis, but endorses reflux.

## 2019-10-21 NOTE — H&P (Addendum)
Darin Smith is an 48 y.o. male.   Chief Complaint: abdominal distention HPI: Pt is a 48 yo M who presented to the ED with abdominal distention and no bowel function for several days.  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  He was doing well and able to go home, but over the last 2 days has had less flatus and now no flatus along with no bowel movements and increasing abdominal girth.  He has become quite uncomfortable. His wife called last night and spoke to Dr. Ninfa Linden.  An suppository followed by an enema was recommended which they tried without result.  They came to the ED for worsening symptoms.    CT showed ileus.  NGT was placed and >1500 mL bilious output is in canister.  He is feeling significantly better at this point, but is still having heartburn and hiccups.    Past Medical History:  Diagnosis Date  . Allergy   . Diverticulitis   . GERD (gastroesophageal reflux disease)   . History of colon polyps   . Hypertension   . OAB (overactive bladder)   . Rupture spleen age 71   hx of abdominal trauma  . Seasonal allergies     Past Surgical History:  Procedure Laterality Date  . BIOPSY PROSTATE  5/13   Dr. Lowella Bandy- negative  . COLONOSCOPY  09/12/2019  . LAPAROSCOPIC PARTIAL COLECTOMY Right 10/11/2019   Procedure: LAPAROSCOPIC RIGHT HEMICOLECTOMY;  Surgeon: Ileana Roup, MD;  Location: [facility];  Service: General;  Laterality: Right;  . SPLENECTOMY      Family History  Problem Relation Age of Onset  . Lupus Mother   . Vision loss Mother        in left eye  . Hypertension Father   . COPD Father   . Obesity Sister   . Thyroid disease Sister   . Multiple sclerosis Sister   . Cancer Neg Hx        prostate  . Colon cancer Neg Hx   . Colon polyps Neg Hx   . Esophageal cancer Neg Hx   . Rectal cancer Neg Hx   . Stomach cancer Neg Hx    Social History:  reports that he has been smoking  cigars. He has never used smokeless tobacco. He reports current alcohol use of about 1.0 standard drinks of alcohol per week. He reports that he does not use drugs.  Allergies: No Known Allergies  Current Meds  Medication Sig  . amLODipine (NORVASC) 10 MG tablet TAKE 1 TABLET BY MOUTH EVERY DAY (Patient taking differently: Take 10 mg by mouth daily. )  . cetirizine (ZYRTEC) 10 MG tablet Take 10 mg by mouth daily.   . metoprolol succinate (TOPROL-XL) 50 MG 24 hr tablet TAKE 1 TABLET (50 MG TOTAL) BY MOUTH DAILY. TAKE WITH OR IMMEDIATELY FOLLOWING A MEAL.  . mirabegron ER (MYRBETRIQ) 25 MG TB24 tablet Take 25 mg by mouth daily.  Marland Kitchen omeprazole (PRILOSEC) 20 MG capsule TAKE 1 CAPSULE BY MOUTH EVERY DAY     Results for orders placed or performed during the hospital encounter of 10/21/19 (from the past 48 hour(s))  Lipase, blood     Status: None   Collection Time: 10/21/19  9:38 AM  Result Value Ref Range   Lipase 17 11 - 51 U/L    Comment: Performed at [facility], [place] [address] [place], [place] [address]  Comprehensive  metabolic panel     Status: Abnormal   Collection Time: 10/21/19  9:38 AM  Result Value Ref Range   Sodium 136 135 - 145 mmol/L   Potassium 4.6 3.5 - 5.1 mmol/L   Chloride 93 (L) 98 - 111 mmol/L   CO2 31 22 - 32 mmol/L   Glucose, Bld 120 (H) 70 - 99 mg/dL   BUN 15 6 - 20 mg/dL   Creatinine, Ser 1.09 0.61 - 1.24 mg/dL   Calcium 10.4 (H) 8.9 - 10.3 mg/dL   Total Protein 8.3 (H) 6.5 - 8.1 g/dL   Albumin 4.5 3.5 - 5.0 g/dL   AST 25 15 - 41 U/L   ALT 68 (H) 0 - 44 U/L   Alkaline Phosphatase 98 38 - 126 U/L   Total Bilirubin 0.8 0.3 - 1.2 mg/dL   GFR calc non Af Amer >60 >60 mL/min   GFR calc Af Amer >60 >60 mL/min   Anion gap 12 5 - 15    Comment: Performed at [facility], [place] [address], [place], [place] [address]  CBC     Status: Abnormal   Collection Time: 10/21/19  9:38 AM  Result Value Ref Range   WBC 12.7 (H) 4.0 - 10.5  K/uL   RBC 4.93 4.22 - 5.81 MIL/uL   Hemoglobin 15.1 13.0 - 17.0 g/dL   HCT 45.6 39.0 - 52.0 %   MCV 92.5 80.0 - 100.0 fL   MCH 30.6 26.0 - 34.0 pg   MCHC 33.1 30.0 - 36.0 g/dL   RDW 12.7 11.5 - 15.5 %   Platelets 489 (H) 150 - 400 K/uL   nRBC 0.0 0.0 - 0.2 %    Comment: Performed at [facility], [place] [address] [place], [place] [address]  Lactic acid, plasma     Status: None   Collection Time: 10/21/19  9:38 AM  Result Value Ref Range   Lactic Acid, Venous 1.2 0.5 - 1.9 mmol/L    Comment: Performed at [facility], [place] [address] [place], [place] [address]   Ct Abdomen Pelvis W Contrast  Result Date: 10/21/2019 CLINICAL DATA:  Postop pain, distension.  Right partial colectomy. EXAM: CT ABDOMEN AND PELVIS WITH CONTRAST TECHNIQUE: Multidetector CT imaging of the abdomen and pelvis was performed using the standard protocol following bolus administration of intravenous contrast. CONTRAST:  190mL OMNIPAQUE IOHEXOL 300 MG/ML  SOLN COMPARISON:  08/15/2019 FINDINGS: Lower chest: Mild bibasilar atelectasis. Hepatobiliary: No focal liver abnormality is seen. No gallstones, gallbladder wall thickening, or biliary dilatation. Pancreas: Unremarkable. No pancreatic ductal dilatation or surrounding inflammatory changes. Spleen: Normal in size without focal abnormality. Adrenals/Urinary Tract: Normal adrenal glands. Punctate nonobstructing left renal calculus. No obstructive uropathy. Normal bladder. Stomach/Bowel: Interval right colectomy. Fecal material at the enterocolic anastomosis. Severe dilatation of the small bowel with multiple air-fluid levels with the small bowel measuring up to 3.8 cm. Mild gaseous distension of the colon. Mild gastric distension which is fluid-filled. No pneumatosis, pneumoperitoneum or portal venous gas. Vascular/Lymphatic: No significant vascular findings are present. No enlarged abdominal or pelvic lymph nodes. Reproductive: Prostate is  unremarkable. Other: No ascites.  No focal fluid collection. Musculoskeletal: No acute osseous abnormality. No aggressive osseous lesion. Anterior bridging osteophytes of bilateral SI joints. IMPRESSION: 1. Interval right colectomy. Feces like material enterocolic anastomosis. Severe dilatation of the small bowel with multiple air-fluid levels measuring up to 3.8 cm in diameter and distension of the stomach. Differential considerations include partial small  bowel obstruction versus an ileus. Electronically Signed   By: Kathreen Devoid   On: 10/21/2019 13:09    Review of Systems  Constitutional: Negative.   HENT: Negative.   Eyes: Negative.   Respiratory: Negative.   Cardiovascular: Negative.   Gastrointestinal: Positive for abdominal pain, constipation, heartburn and nausea.  Genitourinary: Negative.   Musculoskeletal: Negative.   Skin: Negative.   Neurological: Negative.   Endo/Heme/Allergies: Negative.   Psychiatric/Behavioral: Negative.   All other systems reviewed and are negative.   Blood pressure (!) 153/91, pulse 63, temperature 98.9 F (37.2 C), temperature source Oral, resp. rate 15, weight 93.8 kg, SpO2 100 %. Physical Exam  Constitutional: He is oriented to person, place, and time. He appears well-developed and well-nourished. No distress.  HENT:  Head: Normocephalic and atraumatic.  Right Ear: External ear normal.  Left Ear: External ear normal.  Eyes: Pupils are equal, round, and reactive to light. Conjunctivae are normal. Right eye exhibits no discharge. Left eye exhibits no discharge. No scleral icterus.  Neck: Normal range of motion. Neck supple. No tracheal deviation present. No thyromegaly present.  Cardiovascular: Normal rate, regular rhythm, normal heart sounds and intact distal pulses. Exam reveals no gallop and no friction rub.  No murmur heard. Respiratory: Effort normal. No respiratory distress. He has no wheezes. He has no rales. He exhibits no tenderness.  Pt  having hiccups  GI: Soft. He exhibits distension. There is no abdominal tenderness. There is no rebound and no guarding.  Musculoskeletal:        General: No tenderness, deformity or edema.  Lymphadenopathy:    He has no cervical adenopathy.  Neurological: He is alert and oriented to person, place, and time. Coordination normal.  Skin: Skin is warm and dry. No rash noted. He is not diaphoretic. No erythema. No pallor.  Psychiatric: He has a normal mood and affect. His behavior is normal. Judgment and thought content normal.      Assessment/Plan Low grade mucinous appendiceal neoplasm S/p lap right hemicolectomy 10/11/19 by Dr. Dema Severin Post op ileus Thorazine for hiccups  Plan NGT NPO other than sips of clears IV fluids Recheck film and labs in AM.      Stark Klein, MD 10/21/2019, 2:14 PM

## 2019-10-21 NOTE — ED Notes (Signed)
Pt in CT.

## 2019-10-22 ENCOUNTER — Inpatient Hospital Stay (HOSPITAL_COMMUNITY): Payer: BC Managed Care – PPO

## 2019-10-22 LAB — MAGNESIUM: Magnesium: 2.1 mg/dL (ref 1.7–2.4)

## 2019-10-22 LAB — BASIC METABOLIC PANEL
Anion gap: 7 (ref 5–15)
BUN: 16 mg/dL (ref 6–20)
CO2: 29 mmol/L (ref 22–32)
Calcium: 9.1 mg/dL (ref 8.9–10.3)
Chloride: 101 mmol/L (ref 98–111)
Creatinine, Ser: 1.33 mg/dL — ABNORMAL HIGH (ref 0.61–1.24)
GFR calc Af Amer: >60 mL/min (ref >60)
GFR calc non Af Amer: >60 mL/min (ref >60)
Glucose, Bld: 115 mg/dL — ABNORMAL HIGH (ref 70–99)
Potassium: 4.3 mmol/L (ref 3.5–5.1)
Sodium: 137 mmol/L (ref 135–145)

## 2019-10-22 LAB — CBC
HCT: 38.1 % — ABNORMAL LOW (ref 39.0–52.0)
Hemoglobin: 12.4 g/dL — ABNORMAL LOW (ref 13.0–17.0)
MCH: 30.7 pg (ref 26.0–34.0)
MCHC: 32.5 g/dL (ref 30.0–36.0)
MCV: 94.3 fL (ref 80.0–100.0)
Platelets: 389 10*3/uL (ref 150–400)
RBC: 4.04 MIL/uL — ABNORMAL LOW (ref 4.22–5.81)
RDW: 12.6 % (ref 11.5–15.5)
WBC: 9.5 10*3/uL (ref 4.0–10.5)
nRBC: 0 % (ref 0.0–0.2)

## 2019-10-22 LAB — SARS CORONAVIRUS 2 (TAT 6-24 HRS): SARS Coronavirus 2: NEGATIVE

## 2019-10-22 LAB — PHOSPHORUS: Phosphorus: 3.5 mg/dL (ref 2.5–4.6)

## 2019-10-22 LAB — HIV ANTIBODY (ROUTINE TESTING W REFLEX): HIV Screen 4th Generation wRfx: NONREACTIVE

## 2019-10-22 NOTE — Progress Notes (Signed)
Subjective/Chief Complaint: Feeling much better.  Still no stool or gas, but no n/v.     Objective: Vital signs in last 24 hours: Temp:  [98.5 F (36.9 C)-99.2 F (37.3 C)] 99 F (37.2 C) (10/25 0511) Pulse Rate:  [56-63] 63 (10/25 0511) Resp:  [15-20] 20 (10/25 0511) BP: (118-156)/(71-98) 126/71 (10/25 0511) SpO2:  [98 %-100 %] 98 % (10/25 0511) Last BM Date: 10/18/19  Intake/Output from previous day: 10/24 0701 - 10/25 0700 In: 1264.7 [I.V.:1164.7; IV Piggyback:100] Out: 2225 [Urine:625; Emesis/NG output:1600] Intake/Output this shift: Total I/O In: -  Out: 50 [Emesis/NG output:50]  General appearance: alert, cooperative and no distress Resp: breathing comfortably GI: soft, much less distended, non tender. Extremities: extremities normal, atraumatic, no cyanosis or edema  Lab Results:  Recent Labs    10/21/19 1536 10/22/19 0345  WBC 12.2* 9.5  HGB 14.2 12.4*  HCT 42.8 38.1*  PLT 450* 389   BMET Recent Labs    10/21/19 0938 10/21/19 1536 10/22/19 0345  NA 136  --  137  K 4.6  --  4.3  CL 93*  --  101  CO2 31  --  29  GLUCOSE 120*  --  115*  BUN 15  --  16  CREATININE 1.09 1.09 1.33*  CALCIUM 10.4*  --  9.1   PT/INR No results for input(s): LABPROT, INR in the last 72 hours. ABG No results for input(s): PHART, HCO3 in the last 72 hours.  Invalid input(s): PCO2, PO2  Studies/Results: Ct Abdomen Pelvis W Contrast  Result Date: 10/21/2019 CLINICAL DATA:  Postop pain, distension.  Right partial colectomy. EXAM: CT ABDOMEN AND PELVIS WITH CONTRAST TECHNIQUE: Multidetector CT imaging of the abdomen and pelvis was performed using the standard protocol following bolus administration of intravenous contrast. CONTRAST:  157mL OMNIPAQUE IOHEXOL 300 MG/ML  SOLN COMPARISON:  08/15/2019 FINDINGS: Lower chest: Mild bibasilar atelectasis. Hepatobiliary: No focal liver abnormality is seen. No gallstones, gallbladder wall thickening, or biliary dilatation.  Pancreas: Unremarkable. No pancreatic ductal dilatation or surrounding inflammatory changes. Spleen: Normal in size without focal abnormality. Adrenals/Urinary Tract: Normal adrenal glands. Punctate nonobstructing left renal calculus. No obstructive uropathy. Normal bladder. Stomach/Bowel: Interval right colectomy. Fecal material at the enterocolic anastomosis. Severe dilatation of the small bowel with multiple air-fluid levels with the small bowel measuring up to 3.8 cm. Mild gaseous distension of the colon. Mild gastric distension which is fluid-filled. No pneumatosis, pneumoperitoneum or portal venous gas. Vascular/Lymphatic: No significant vascular findings are present. No enlarged abdominal or pelvic lymph nodes. Reproductive: Prostate is unremarkable. Other: No ascites.  No focal fluid collection. Musculoskeletal: No acute osseous abnormality. No aggressive osseous lesion. Anterior bridging osteophytes of bilateral SI joints. IMPRESSION: 1. Interval right colectomy. Feces like material enterocolic anastomosis. Severe dilatation of the small bowel with multiple air-fluid levels measuring up to 3.8 cm in diameter and distension of the stomach. Differential considerations include partial small bowel obstruction versus an ileus. Electronically Signed   By: Kathreen Devoid   On: 10/21/2019 13:09   Dg Abd Portable 1v  Result Date: 10/21/2019 CLINICAL DATA:  Postoperative pain and distension, partial colectomy EXAM: PORTABLE ABDOMEN - 1 VIEW COMPARISON:  CT abdomen pelvis performed the same day the FINDINGS: Postsurgical changes of the anterior abdominal wall with midline and 4 quadrant skin staples present. Multiple contiguous loops of this air distended small bowel throughout the mid abdomen. No suspicious calcifications. Transesophageal tube tip terminates in the region of the gastric body with the side port  distal to the GE junction. Excreted contrast media is seen within the bladder. No acute osseous  abnormality. IMPRESSION: Satisfactory positioning of the transesophageal tube. Diffuse gaseous distention of the small bowel could reflect ileus in the postoperative setting or small-bowel obstruction. Electronically Signed   By: Lovena Le M.D.   On: 10/21/2019 22:57   Dg Abd Portable 2v  Result Date: 10/22/2019 CLINICAL DATA:  Ileus after GI surgery. EXAM: PORTABLE ABDOMEN - 2 VIEW COMPARISON:  10/21/2019 FINDINGS: The NG tube is unchanged in position with tip projecting over the expected location of the body of stomach. Mild gaseous distension of the colon is again noted, improved from previous exam. Decreased small bowel gas. IMPRESSION: 1. Improving bowel gas pattern compatible with resolving ileus. 2. Decreased small bowel gas. 3. Stable position of nasogastric tube. Electronically Signed   By: Kerby Moors M.D.   On: 10/22/2019 05:32    Anti-infectives: Anti-infectives (From admission, onward)   None      Assessment/Plan: S/p lap right colon for LGMN of the appendix.    continue NGT to LIWS.  Will let Dr. Dema Severin know he is here.   Improving post op ileus.     LOS: 1 day    Stark Klein 10/22/2019

## 2019-10-23 MED ORDER — MAGIC MOUTHWASH
15.0000 mL | Freq: Four times a day (QID) | ORAL | Status: DC | PRN
  Filled 2019-10-23: qty 15

## 2019-10-23 MED ORDER — DIPHENHYDRAMINE HCL 50 MG/ML IJ SOLN: 12.5000 mg | Freq: Four times a day (QID) | INTRAMUSCULAR | Status: DC | PRN

## 2019-10-23 MED ORDER — ACETAMINOPHEN 650 MG RE SUPP: 650.0000 mg | Freq: Four times a day (QID) | RECTAL | Status: DC | PRN

## 2019-10-23 MED ORDER — BISACODYL 10 MG RE SUPP
10.0000 mg | Freq: Every day | RECTAL | Status: DC
  Administered 2019-10-23: 17:00:00 10 mg via RECTAL
  Filled 2019-10-23 (×2): qty 1

## 2019-10-23 MED ORDER — LIP MEDEX EX OINT
1.0000 "application " | TOPICAL_OINTMENT | Freq: Two times a day (BID) | CUTANEOUS | Status: DC
  Administered 2019-10-23 – 2019-10-24 (×3): 1 via TOPICAL
  Filled 2019-10-23 (×3): qty 7

## 2019-10-23 MED ORDER — LACTATED RINGERS IV BOLUS: 1000.0000 mL | Freq: Three times a day (TID) | INTRAVENOUS | Status: DC | PRN

## 2019-10-23 NOTE — Progress Notes (Signed)
Subjective/Chief Complaint: Feeling much better than Saturday.  Passing some flatus, no bm yet.   Objective: Vital signs in last 24 hours: Temp:  [98.2 F (36.8 C)-99.5 F (37.5 C)] 99 F (37.2 C) (10/26 0539) Pulse Rate:  [59-67] 63 (10/26 0539) Resp:  [16-20] 18 (10/26 0539) BP: (121-132)/(66-75) 121/66 (10/26 0539) SpO2:  [99 %-100 %] 99 % (10/26 0539) Last BM Date: 10/18/19  Intake/Output from previous day: 10/25 0701 - 10/26 0700 In: 2021.9 [P.O.:120; I.V.:1901.9] Out: 2850 [Urine:2200; Emesis/NG output:650] Intake/Output this shift: No intake/output data recorded.  General appearance: alert, cooperative and no distress Resp: breathing comfortably GI: soft, nontender; no significantly distended Extremities: wwp without significant pitting edema  Lab Results:  Recent Labs    10/21/19 1536 10/22/19 0345  WBC 12.2* 9.5  HGB 14.2 12.4*  HCT 42.8 38.1*  PLT 450* 389   BMET Recent Labs    10/21/19 0938 10/21/19 1536 10/22/19 0345  NA 136  --  137  K 4.6  --  4.3  CL 93*  --  101  CO2 31  --  29  GLUCOSE 120*  --  115*  BUN 15  --  16  CREATININE 1.09 1.09 1.33*  CALCIUM 10.4*  --  9.1   PT/INR No results for input(s): LABPROT, INR in the last 72 hours. ABG No results for input(s): PHART, HCO3 in the last 72 hours.  Invalid input(s): PCO2, PO2  Studies/Results: Ct Abdomen Pelvis W Contrast  Result Date: 10/21/2019 CLINICAL DATA:  Postop pain, distension.  Right partial colectomy. EXAM: CT ABDOMEN AND PELVIS WITH CONTRAST TECHNIQUE: Multidetector CT imaging of the abdomen and pelvis was performed using the standard protocol following bolus administration of intravenous contrast. CONTRAST:  164mL OMNIPAQUE IOHEXOL 300 MG/ML  SOLN COMPARISON:  08/15/2019 FINDINGS: Lower chest: Mild bibasilar atelectasis. Hepatobiliary: No focal liver abnormality is seen. No gallstones, gallbladder wall thickening, or biliary dilatation. Pancreas: Unremarkable. No  pancreatic ductal dilatation or surrounding inflammatory changes. Spleen: Normal in size without focal abnormality. Adrenals/Urinary Tract: Normal adrenal glands. Punctate nonobstructing left renal calculus. No obstructive uropathy. Normal bladder. Stomach/Bowel: Interval right colectomy. Fecal material at the enterocolic anastomosis. Severe dilatation of the small bowel with multiple air-fluid levels with the small bowel measuring up to 3.8 cm. Mild gaseous distension of the colon. Mild gastric distension which is fluid-filled. No pneumatosis, pneumoperitoneum or portal venous gas. Vascular/Lymphatic: No significant vascular findings are present. No enlarged abdominal or pelvic lymph nodes. Reproductive: Prostate is unremarkable. Other: No ascites.  No focal fluid collection. Musculoskeletal: No acute osseous abnormality. No aggressive osseous lesion. Anterior bridging osteophytes of bilateral SI joints. IMPRESSION: 1. Interval right colectomy. Feces like material enterocolic anastomosis. Severe dilatation of the small bowel with multiple air-fluid levels measuring up to 3.8 cm in diameter and distension of the stomach. Differential considerations include partial small bowel obstruction versus an ileus. Electronically Signed   By: Kathreen Devoid   On: 10/21/2019 13:09   Dg Abd Portable 1v  Result Date: 10/21/2019 CLINICAL DATA:  Postoperative pain and distension, partial colectomy EXAM: PORTABLE ABDOMEN - 1 VIEW COMPARISON:  CT abdomen pelvis performed the same day the FINDINGS: Postsurgical changes of the anterior abdominal wall with midline and 4 quadrant skin staples present. Multiple contiguous loops of this air distended small bowel throughout the mid abdomen. No suspicious calcifications. Transesophageal tube tip terminates in the region of the gastric body with the side port distal to the GE junction. Excreted contrast media is seen within  the bladder. No acute osseous abnormality. IMPRESSION:  Satisfactory positioning of the transesophageal tube. Diffuse gaseous distention of the small bowel could reflect ileus in the postoperative setting or small-bowel obstruction. Electronically Signed   By: Lovena Le M.D.   On: 10/21/2019 22:57   Dg Abd Portable 2v  Result Date: 10/22/2019 CLINICAL DATA:  Ileus after GI surgery. EXAM: PORTABLE ABDOMEN - 2 VIEW COMPARISON:  10/21/2019 FINDINGS: The NG tube is unchanged in position with tip projecting over the expected location of the body of stomach. Mild gaseous distension of the colon is again noted, improved from previous exam. Decreased small bowel gas. IMPRESSION: 1. Improving bowel gas pattern compatible with resolving ileus. 2. Decreased small bowel gas. 3. Stable position of nasogastric tube. Electronically Signed   By: Kerby Moors M.D.   On: 10/22/2019 05:32    Anti-infectives: Anti-infectives (From admission, onward)   None      Assessment/Plan: S/p lap right colon for LGMN of the appendix.    continue NGT to LIWS. After reviewing scan, likely had food bolus just upstream of his anastomosis given findings - these are often self-limited and more common in early postop period 2/2 swelling or even anastomotic hematoma -Ambulate 5x/day -If doing well, can clamp ng later today and allow sips of liquids   LOS: 2 days   Ileana Roup 10/23/2019

## 2019-10-24 NOTE — Progress Notes (Signed)
   Subjective/Chief Complaint: Feeling much better.  Passing lots of gas and having BM   Objective: Vital signs in last 24 hours: Temp:  [98.5 F (36.9 C)-100.2 F (37.9 C)] 98.5 F (36.9 C) (10/27 0455) Pulse Rate:  [62-64] 62 (10/27 0455) Resp:  [18] 18 (10/27 0455) BP: (116-130)/(70-85) 116/73 (10/27 0455) SpO2:  [99 %-100 %] 100 % (10/27 0455) Last BM Date: 10/23/19  Intake/Output from previous day: 10/26 0701 - 10/27 0700 In: 2361.5 [P.O.:60; I.V.:2301.5] Out: 1550 [Urine:750; Emesis/NG output:800] Intake/Output this shift: No intake/output data recorded.  General appearance: alert, cooperative and no distress Resp: breathing comfortably GI: soft, nontender; no significantly distended Extremities: wwp without significant pitting edema  Lab Results:  Recent Labs    10/21/19 1536 10/22/19 0345  WBC 12.2* 9.5  HGB 14.2 12.4*  HCT 42.8 38.1*  PLT 450* 389   BMET Recent Labs    10/21/19 0938 10/21/19 1536 10/22/19 0345  NA 136  --  137  K 4.6  --  4.3  CL 93*  --  101  CO2 31  --  29  GLUCOSE 120*  --  115*  BUN 15  --  16  CREATININE 1.09 1.09 1.33*  CALCIUM 10.4*  --  9.1   PT/INR No results for input(s): LABPROT, INR in the last 72 hours. ABG No results for input(s): PHART, HCO3 in the last 72 hours.  Invalid input(s): PCO2, PO2  Studies/Results: No results found.  Anti-infectives: Anti-infectives (From admission, onward)   None      Assessment/Plan: S/p lap right colon for LGMN of the appendix.    -D/C NG today, start clear liquids -If tolerates clear liquids, will plan to advance later today -Ambulate 5x/day -PPx: Lovenox, SCDs   LOS: 3 days   Ileana Roup 10/24/2019

## 2019-10-25 NOTE — Discharge Instructions (Signed)
POST OP INSTRUCTIONS AFTER COLON SURGERY  1. DIET: Be sure to include lots of fluids daily to stay hydrated - 64oz of water per day (8, 8 oz glasses).  Avoid fast food or heavy meals for the first couple of weeks as your are more likely to get nauseated. Avoid raw/uncooked fruits or vegetables for the first 4 weeks. Otherwise, diet as tolerated.  2. Take your usually prescribed home medications unless otherwise directed.  3. PAIN CONTROL: a. Pain is best controlled by a usual combination of three different methods TOGETHER: i. Ice/Heat ii. Over the counter pain medication iii. Prescription pain medication b. Most patients will experience some swelling and bruising around the surgical site.  Ice packs or heating pads (30-60 minutes up to 6 times a day) will help. Some people prefer to use ice alone, heat alone, alternating between ice & heat.  Experiment to what works for you.  Swelling and bruising can take several weeks to resolve.   c. It is helpful to take an over-the-counter pain medication regularly for the first few weeks: i. Ibuprofen (Motrin/Advil) - 200mg  tabs - take 3 tabs (600mg ) every 6 hours as needed for pain (unless you have been directed previously to avoid NSAIDs/ibuprofen) ii. Acetaminophen (Tylenol) - you may take 650mg  every 6 hours as needed. You can take this with motrin as they act differently on the body. If you are taking a narcotic pain medication that has acetaminophen in it, do not take over the counter tylenol at the same time. iii. NOTE: You may take both of these medications together - most patients  find it most helpful when alternating between the two (i.e. Ibuprofen at 6am, tylenol at 9am, ibuprofen at 12pm ...) d. A  prescription for pain medication should be given to you upon discharge.  Take your pain medication as prescribed if your pain is not adequatly controlled with the over-the-counter pain reliefs mentioned above.  4. Avoid getting constipated.  Between  the surgery and the pain medications, it is common to experience some constipation.  Increasing fluid intake and taking a fiber supplement (such as Metamucil, Citrucel, FiberCon, MiraLax, etc) 1-2 times a day regularly will usually help prevent this problem from occurring.  A mild laxative (prune juice, Milk of Magnesia, MiraLax, etc) should be taken according to package directions if there are no bowel movements after 48 hours.    5. ACTIVITIES as tolerated:   a. Avoid heavy lifting (>10lbs or 1 gallon of milk) for the next 6 weeks. b. You may resume regular daily activities as tolerated--such as daily self-care, walking, climbing stairs--gradually increasing activities as tolerated.  If you can walk 30 minutes without difficulty, it is safe to try more intense activity such as jogging, treadmill, bicycling, low-impact aerobics.  c. DO NOT PUSH THROUGH PAIN.  Let pain be your guide: If it hurts to do something, don't do it. d. Darin Smith may drive when you are no longer taking prescription pain medication, you can comfortably wear a seatbelt, and you can safely maneuver your car and apply brakes. e. Darin Smith may have sexual intercourse when it is comfortable.   6. FOLLOW UP in our office a. Please call [facility] at [phone] to set up an appointment to see your surgeon in the office for a follow-up appointment approximately 2 weeks after your surgery. b. Make sure that you call for this appointment the day you arrive home to insure a convenient appointment time.  9. If you have disability or family  leave forms that need to be completed, you may have them completed by your primary care physician's office; for return to work instructions, please ask our office staff and they will be happy to assist you in obtaining this documentation   When to call us [phone]: 1. Poor pain control 2. Reactions / problems with new medications (rash/itching, etc)  3. Fever over 101.5 F (38.5 C) 4. Inability to  urinate 5. Nausea/vomiting 6. Worsening swelling or bruising 7. Continued bleeding from incision. 8. Increased pain, redness, or drainage from the incision  The clinic staff is available to answer your questions during regular business hours (8:30am-5pm).  Please dont hesitate to call and ask to speak to one of our nurses for clinical concerns.   A surgeon from [facility] is always on call at the hospitals   If you have a medical emergency, go to the nearest emergency room or call 911.  [facility], [place] [address] [place], [place], [place]  [address] MAIN: [phone] FAX: [phone] [email or web address]

## 2019-10-25 NOTE — Plan of Care (Signed)
Reviewed discharge instructions with patient; copy given. IV removed. Patient ready for discharge.  

## 2019-10-25 NOTE — Discharge Summary (Signed)
Patient ID: Darin Smith MRN: [id number] DOB/AGE: [birth year] 48 y.o.  Admit date: 10/21/2019 Discharge date: 10/25/2019  Discharge Diagnoses Patient Active Problem List   Diagnosis Date Noted  . Ileus following gastrointestinal surgery ([place]) 10/21/2019  . S/P right hemicolectomy 10/11/2019  . Tendonitis, Achilles, left 07/12/2019  . Allergic rhinitis 04/22/2016  . Routine general medical examination at a health care facility 09/24/2014  . HTN (hypertension) 09/24/2014  . Plantar fascia rupture 08/07/2013  . GERD (gastroesophageal reflux disease) 08/12/2012  . Elevated PSA 08/12/2012  . Preventative health care 08/04/2011  . NOCTURIA 05/14/2009  . 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 He was made NPO and improved rapidly. He began passing gas and having bowel movements. His diet as advanced and he tolerated well. On 10/25/2019 he continued to have bowel function, was eating solid foods without issue and his abdominal distention had completely resolved. He was deemed stable for discharge home  AF VSS NAD, comfortable and in good spirits RRR Abdomen is soft, NT/ND; staples in place - to be removed    Allergies as of 10/25/2019   No Known Allergies     Medication List    TAKE these medications   amLODipine 10 MG tablet Commonly known as: NORVASC TAKE 1 TABLET BY MOUTH EVERY DAY   cetirizine 10 MG tablet Commonly known as: ZYRTEC Take 10 mg by mouth daily.   diclofenac 75 MG EC tablet Commonly known as: VOLTAREN Take 1 tablet (75 mg total) by mouth 2 (two) times daily.   metoprolol succinate 50 MG 24 hr tablet Commonly known as: TOPROL-XL TAKE 1 TABLET (50 MG TOTAL) BY MOUTH DAILY. TAKE WITH OR IMMEDIATELY FOLLOWING A MEAL.   Myrbetriq 25 MG Tb24 tablet Generic drug: mirabegron ER Take 25 mg by mouth daily.   omeprazole 20 MG  capsule Commonly known as: PRILOSEC TAKE 1 CAPSULE BY MOUTH EVERY DAY          Sharon Mt. Dema Severin, M.D. [facility]

## 2019-10-25 NOTE — Plan of Care (Signed)
Patient up in room this morning; pain controlled at this time. No needs expressed. Will continue to monitor.

## 2019-11-02 ENCOUNTER — Other Ambulatory Visit: Payer: Self-pay

## 2019-11-03 ENCOUNTER — Ambulatory Visit (INDEPENDENT_AMBULATORY_CARE_PROVIDER_SITE_OTHER): Payer: BC Managed Care – PPO | Admitting: Family

## 2019-11-03 ENCOUNTER — Other Ambulatory Visit: Payer: Self-pay

## 2019-11-03 ENCOUNTER — Encounter: Payer: Self-pay | Admitting: Family

## 2019-11-03 VITALS — BP 126/78 | HR 78 | Temp 96.4°F | Resp 16 | Ht 74.0 in | Wt 201.0 lb

## 2019-11-03 DIAGNOSIS — Z0000 Encounter for general adult medical examination without abnormal findings: Secondary | ICD-10-CM | POA: Diagnosis not present

## 2019-11-03 LAB — LIPID PANEL
Cholesterol: 176 mg/dL (ref 0–200)
HDL: 35.8 mg/dL — ABNORMAL LOW (ref >39.00)
NonHDL: 140.69
Total CHOL/HDL Ratio: 5
Triglycerides: 224 mg/dL — ABNORMAL HIGH (ref 0.0–149.0)
VLDL: 44.8 mg/dL — ABNORMAL HIGH (ref 0.0–40.0)

## 2019-11-03 LAB — LDL CHOLESTEROL, DIRECT: Direct LDL: 108 mg/dL

## 2019-11-03 LAB — CBC WITH DIFFERENTIAL/PLATELET
Basophils Absolute: 0 10*3/uL (ref 0.0–0.1)
Basophils Relative: 0.3 % (ref 0.0–3.0)
Eosinophils Absolute: 0.2 10*3/uL (ref 0.0–0.7)
Eosinophils Relative: 2.8 % (ref 0.0–5.0)
HCT: 39.2 % (ref 39.0–52.0)
Hemoglobin: 13.1 g/dL (ref 13.0–17.0)
Lymphocytes Relative: 24 % (ref 12.0–46.0)
Lymphs Abs: 1.6 10*3/uL (ref 0.7–4.0)
MCHC: 33.5 g/dL (ref 30.0–36.0)
MCV: 93.4 fl (ref 78.0–100.0)
Monocytes Absolute: 0.5 10*3/uL (ref 0.1–1.0)
Monocytes Relative: 7 % (ref 3.0–12.0)
Neutro Abs: 4.3 10*3/uL (ref 1.4–7.7)
Neutrophils Relative %: 65.9 % (ref 43.0–77.0)
Platelets: 400 10*3/uL (ref 150.0–400.0)
RBC: 4.2 Mil/uL — ABNORMAL LOW (ref 4.22–5.81)
RDW: 13.4 % (ref 11.5–15.5)
WBC: 6.5 10*3/uL (ref 4.0–10.5)

## 2019-11-03 LAB — TSH: TSH: 2.64 u[IU]/mL (ref 0.35–4.50)

## 2019-11-03 LAB — COMPREHENSIVE METABOLIC PANEL
ALT: 23 U/L (ref 0–53)
AST: 12 U/L (ref 0–37)
Albumin: 4 g/dL (ref 3.5–5.2)
Alkaline Phosphatase: 66 U/L (ref 39–117)
BUN: 13 mg/dL (ref 6–23)
CO2: 29 mEq/L (ref 19–32)
Calcium: 9.4 mg/dL (ref 8.4–10.5)
Chloride: 107 mEq/L (ref 96–112)
Creatinine, Ser: 1.13 mg/dL (ref 0.40–1.50)
GFR: 83.78 mL/min (ref >60.00)
Glucose, Bld: 92 mg/dL (ref 70–99)
Potassium: 4.4 mEq/L (ref 3.5–5.1)
Sodium: 143 mEq/L (ref 135–145)
Total Bilirubin: 0.4 mg/dL (ref 0.2–1.2)
Total Protein: 6.3 g/dL (ref 6.0–8.3)

## 2019-11-03 NOTE — Progress Notes (Addendum)
Subjective:    Patient ID: Darin Smith, male    DOB: Aug 20, 1971, 48 y.o.   MRN: [id number]  HPI  Patient presents today for complete physical. Since his last visit he had surgery to remove an appendiceal mass.  He underwent right hemicolectomy on 10/14.  Path revealed:  Low-grade appendiceal mucinous neoplasm. He is being followed by Dr. Dema Severin for this. He had readmission for a partial small bowel obstruction post operatively and was admitted 10/24-10/28.  Reports that he is feeling well since her returned home.   Immunizations: tdap 2015 Diet: healthy Wt Readings from Last 3 Encounters:  11/03/19 201 lb (91.2 kg)  10/21/19 206 lb 12.8 oz (93.8 kg)  10/14/19 212 lb 1.3 oz (96.2 kg)  exercise- not currently  Colonoscopy: 9/20 PSA:  Urology is watching his PSA Lab Results  Component Value Date   PSA 4.01 (H) 09/12/2011   PSA 3.83 08/04/2011   PSA 1.78 05/14/2009    Review of Systems  Constitutional: Positive for unexpected weight change.  HENT: Negative for hearing loss and rhinorrhea.   Eyes: Negative for visual disturbance.  Respiratory: Negative for cough.   Cardiovascular: Negative for chest pain.  Gastrointestinal: Negative for constipation and diarrhea.  Genitourinary: Negative for dysuria and frequency.  Musculoskeletal: Negative for arthralgias and myalgias.  Skin: Negative for rash.  Neurological: Negative for headaches.  Hematological: Negative for adenopathy.  Psychiatric/Behavioral:       Denies depression/anxiety   Past Medical History:  Diagnosis Date  . Allergy   . Diverticulitis   . GERD (gastroesophageal reflux disease)   . History of colon polyps   . Hypertension   . OAB (overactive bladder)   . Rupture spleen age 31   hx of abdominal trauma  . Seasonal allergies      Social History   Socioeconomic History  . Marital status: Married    Spouse name: Not on file  . Number of children: Not on file  . Years of education: Not on file  .  Highest education level: Not on file  Occupational History  . Not on file  Social Needs  . Financial resource strain: Not on file  . Food insecurity    Worry: Not on file    Inability: Not on file  . Transportation needs    Medical: Not on file    Non-medical: Not on file  Tobacco Use  . Smoking status: Current Every Day Smoker    Types: Cigars  . Smokeless tobacco: Never Used  . Tobacco comment: 1-2 cigar a day  Substance and Sexual Activity  . Alcohol use: Yes    Alcohol/week: 1.0 standard drinks    Types: 1 Shots of liquor per week    Comment: daily  . Drug use: No  . Sexual activity: Not on file  Lifestyle  . Physical activity    Days per week: Not on file    Minutes per session: Not on file  . Stress: Not on file  Relationships  . Social Herbalist on phone: Not on file    Gets together: Not on file    Attends religious service: Not on file    Active member of club or organization: Not on file    Attends meetings of clubs or organizations: Not on file    Relationship status: Not on file  . Intimate partner violence    Fear of current or ex partner: Not on file    Emotionally abused:  Not on file    Physically abused: Not on file    Forced sexual activity: Not on file  Other Topics Concern  . 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 They live in [place] 7/15                   Past Surgical History:  Procedure Laterality Date  . BIOPSY PROSTATE  5/13   Dr. Lowella Bandy- negative  . COLONOSCOPY  09/12/2019  . LAPAROSCOPIC PARTIAL COLECTOMY Right 10/11/2019   Procedure: LAPAROSCOPIC RIGHT HEMICOLECTOMY;  Surgeon: Ileana Roup, MD;  Location: [facility];  Service: General;  Laterality: Right;  . SPLENECTOMY      Family History  Problem Relation Age of Onset  . Lupus Mother   . Vision loss Mother        in left eye   . Hypertension Father   . COPD Father   . Obesity Sister   . Thyroid disease Sister   . Multiple sclerosis Sister   . Cancer Neg Hx        prostate  . Colon cancer Neg Hx   . Colon polyps Neg Hx   . Esophageal cancer Neg Hx   . Rectal cancer Neg Hx   . Stomach cancer Neg Hx     No Known Allergies  Current Outpatient Medications on File Prior to Visit  Medication Sig Dispense Refill  . amLODipine (NORVASC) 10 MG tablet TAKE 1 TABLET BY MOUTH EVERY DAY (Patient taking differently: Take 10 mg by mouth daily. ) 90 tablet 1  . cetirizine (ZYRTEC) 10 MG tablet Take 10 mg by mouth daily.     . metoprolol succinate (TOPROL-XL) 50 MG 24 hr tablet TAKE 1 TABLET (50 MG TOTAL) BY MOUTH DAILY. TAKE WITH OR IMMEDIATELY FOLLOWING A MEAL. 90 tablet 1  . mirabegron ER (MYRBETRIQ) 25 MG TB24 tablet Take 25 mg by mouth daily.    Marland Kitchen omeprazole (PRILOSEC) 20 MG capsule TAKE 1 CAPSULE BY MOUTH EVERY DAY 90 capsule 1   No current facility-administered medications on file prior to visit.     BP 126/78 (BP Location: Left Arm, Patient Position: Sitting, Cuff Size: Normal)   Pulse 78   Temp (!) 96.4 F (35.8 C) (Temporal)   Resp 16   Ht 6\' 2"  (1.88 m)   Wt 201 lb (91.2 kg)   SpO2 98%   BMI 25.81 kg/m       Objective:   Physical Exam Physical Exam  Constitutional: He is oriented to person, place, and time. He appears well-developed and well-nourished. No distress.  HENT:  Head: Normocephalic and atraumatic.  Right Ear: Tympanic membrane and ear canal normal.  Left Ear: Tympanic membrane and ear canal normal.  Mouth/Throat: Not examined- pt wearing mask Eyes: Pupils are equal, round, and reactive to light. No scleral icterus.  Neck: Normal range of motion. No thyromegaly present.  Cardiovascular: Normal rate and regular rhythm.  No murmur heard. Pulmonary/Chest: Effort normal and breath sounds normal. No respiratory distress. He has no wheezes. He has no rales. He exhibits no tenderness.   Abdominal: Soft. Bowel sounds are hypoactive. He exhibits no distension and no mass. There is no tenderness. There is no rebound and no guarding. midline abdominal incision is well healed Musculoskeletal: He exhibits no edema.  Lymphadenopathy:  He has no cervical adenopathy.  Neurological: He is alert and oriented to person, place, and time. He has normal patellar reflexes. He exhibits normal muscle tone. Coordination normal.  Skin: Skin is warm and dry.  Psychiatric: He has a normal mood and affect. His behavior is normal. Judgment and thought content normal.           Assessment & Plan:   Preventative care- discussed healthy diet. He has lost some weight following his surgery and notes that he is trying to maintain this new weight.  Flu shot today. Obtain routine lab work.          Assessment & Plan:

## 2019-11-03 NOTE — Patient Instructions (Signed)
Please complete lab work prior to leaving.   

## 2019-11-27 ENCOUNTER — Encounter: Payer: Self-pay | Admitting: Family

## 2019-11-27 NOTE — Telephone Encounter (Signed)
Patient scheduled for virtual visit tomorrow 

## 2019-11-28 ENCOUNTER — Ambulatory Visit: Payer: BC Managed Care – PPO | Admitting: Family

## 2019-11-28 ENCOUNTER — Other Ambulatory Visit: Payer: Self-pay

## 2019-11-29 ENCOUNTER — Other Ambulatory Visit (HOSPITAL_COMMUNITY)
Admission: RE | Admit: 2019-11-29 | Discharge: 2019-11-29 | Disposition: A | Discharge status: Home / Self Care | Payer: BC Managed Care – PPO | Source: Ambulatory Visit | Attending: Family | Admitting: Family

## 2019-11-29 ENCOUNTER — Encounter: Payer: Self-pay | Admitting: Family

## 2019-11-29 ENCOUNTER — Other Ambulatory Visit: Payer: Self-pay

## 2019-11-29 ENCOUNTER — Ambulatory Visit: Payer: BC Managed Care – PPO | Admitting: Family

## 2019-11-29 VITALS — BP 140/82 | HR 102 | Temp 97.7°F | Resp 18 | Ht 74.0 in | Wt 208.0 lb

## 2019-11-29 DIAGNOSIS — Z113 Encounter for screening for infections with a predominantly sexual mode of transmission: Secondary | ICD-10-CM

## 2019-11-29 NOTE — Patient Instructions (Signed)
Please complete lab work prior to leaving.   

## 2019-11-29 NOTE — Progress Notes (Signed)
Subjective:    Patient ID: Darin Smith, male    DOB: January 30, 1971, 48 y.o.   MRN: [id number]  HPI  Patient is a 48 year old male who presents today requesting STD testing.  He denies any current symptoms or new sexual contacts.  He states that he and his wife are currently undergoing counseling and his wife has requested that he have this completed.   Review of Systems See HPI  Past Medical History:  Diagnosis Date  . Allergy   . 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 Diverticulitis   . GERD (gastroesophageal reflux disease)   . History of colon polyps   . Hypertension   . OAB (overactive bladder)   . Rupture spleen age 45   hx of abdominal trauma  . Seasonal allergies      Social History   Socioeconomic History  . Marital status: Married    Spouse name: Not on file  . Number of children: Not on file  . Years of education: Not on file  . Highest education level: Not on file  Occupational History  . Not on file  Social Needs  . Financial resource strain: Not on file  . Food insecurity    Worry: Not on file    Inability: Not on file  . Transportation needs    Medical: Not on file    Non-medical: Not on file  Tobacco Use  . Smoking status: Current Every Day Smoker    Types: Cigars  . Smokeless tobacco: Never Used  . Tobacco comment: 1-2 cigar a day  Substance and Sexual Activity  . Alcohol use: Yes    Alcohol/week: 1.0 standard drinks    Types: 1 Shots of liquor per week    Comment: daily  . Drug use: No  . Sexual activity: Not on file  Lifestyle  . Physical activity    Days per week: Not on file    Minutes per session: Not on file  . Stress: Not on file  Relationships  . Social Herbalist on phone: Not on file    Gets together: Not on file    Attends religious service: Not on file    Active member of club or organization: Not on file    Attends meetings of clubs or organizations: Not on file   Relationship status: Not on file  . Intimate partner violence    Fear of current or ex partner: Not on file    Emotionally abused: Not on file    Physically abused: Not on file    Forced sexual activity: Not on file  Other Topics Concern  . 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 They live in [place] 7/15                   Past Surgical History:  Procedure Laterality Date  . BIOPSY PROSTATE  5/13   Dr. Lowella Bandy- negative  . COLONOSCOPY  09/12/2019  . LAPAROSCOPIC PARTIAL COLECTOMY Right 10/11/2019   Procedure: LAPAROSCOPIC RIGHT HEMICOLECTOMY;  Surgeon: Ileana Roup, MD;  Location: [facility];  Service: General;  Laterality: Right;  . SPLENECTOMY      Family History  Problem Relation Age of Onset  . Lupus Mother   . Vision loss Mother  in left eye  . Hypertension Father   . COPD Father   . Obesity Sister   . Thyroid disease Sister   . Multiple sclerosis Sister   . Cancer Neg Hx        prostate  . Colon cancer Neg Hx   . Colon polyps Neg Hx   . Esophageal cancer Neg Hx   . Rectal cancer Neg Hx   . Stomach cancer Neg Hx     No Known Allergies  Current Outpatient Medications on File Prior to Visit  Medication Sig Dispense Refill  . amLODipine (NORVASC) 10 MG tablet TAKE 1 TABLET BY MOUTH EVERY DAY (Patient taking differently: Take 10 mg by mouth daily. ) 90 tablet 1  . cetirizine (ZYRTEC) 10 MG tablet Take 10 mg by mouth daily.     . metoprolol succinate (TOPROL-XL) 50 MG 24 hr tablet TAKE 1 TABLET (50 MG TOTAL) BY MOUTH DAILY. TAKE WITH OR IMMEDIATELY FOLLOWING A MEAL. 90 tablet 1  . mirabegron ER (MYRBETRIQ) 25 MG TB24 tablet Take 25 mg by mouth daily.    Marland Kitchen omeprazole (PRILOSEC) 20 MG capsule TAKE 1 CAPSULE BY MOUTH EVERY DAY 90 capsule 1   No current facility-administered medications on file prior to visit.     BP 140/82  (BP Location: Right Arm, Patient Position: Sitting, Cuff Size: Large)   Pulse (!) 102   Temp 97.7 F (36.5 C) (Temporal)   Resp 18   Ht 6\' 2"  (1.88 m)   Wt 208 lb (94.3 kg)   SpO2 100%   BMI 26.71 kg/m       Objective:   Physical Exam Constitutional:      General: He is not in acute distress.    Appearance: He is well-developed.  HENT:     Head: Normocephalic and atraumatic.  Pulmonary:     Effort: Pulmonary effort is normal.  Neurological:     Mental Status: He is alert and oriented to person, place, and time.  Psychiatric:        Behavior: Behavior normal.        Thought Content: Thought content normal.           Assessment & Plan:  Need for STD screening-will obtain HIV screen, syphilis screen, urine ancillary testing for gonorrhea, chlamydia, and trichomonas.  He has a known history of HSV-2 therefore we will not test for HSV-2.  This visit occurred during the SARS-CoV-2 public health emergency.  Safety protocols were in place, including screening questions prior to the visit, additional usage of staff PPE, and extensive cleaning of exam room while observing appropriate contact time as indicated for disinfecting solutions.

## 2019-11-30 LAB — HEPATITIS PANEL, ACUTE
Hep A IgM: NONREACTIVE
Hep B C IgM: NONREACTIVE
Hepatitis B Surface Ag: NONREACTIVE
Hepatitis C Ab: NONREACTIVE
SIGNAL TO CUT-OFF: 0.02 (ref <1.00)

## 2019-11-30 LAB — HIV ANTIBODY (ROUTINE TESTING W REFLEX): HIV 1&2 Ab, 4th Generation: NONREACTIVE

## 2019-11-30 LAB — URINE CYTOLOGY ANCILLARY ONLY
Chlamydia: NEGATIVE
Comment: NEGATIVE
Comment: NEGATIVE
Comment: NORMAL
Neisseria Gonorrhea: NEGATIVE
Trichomonas: NEGATIVE

## 2019-11-30 LAB — RPR: RPR Ser Ql: NONREACTIVE

## 2019-12-16 ENCOUNTER — Other Ambulatory Visit: Payer: Self-pay | Admitting: Family

## 2019-12-20 ENCOUNTER — Encounter: Payer: Self-pay | Admitting: Family

## 2019-12-20 ENCOUNTER — Other Ambulatory Visit: Payer: Self-pay

## 2019-12-20 MED ORDER — METOPROLOL SUCCINATE ER 50 MG PO TB24: 50.0000 mg | ORAL_TABLET | Freq: Every day | ORAL | 1 refills | Status: DC

## 2020-04-06 ENCOUNTER — Ambulatory Visit: Payer: BC Managed Care – PPO | Attending: Internal Medicine

## 2020-04-06 DIAGNOSIS — Z23 Encounter for immunization: Secondary | ICD-10-CM

## 2020-04-06 NOTE — Progress Notes (Signed)
   Covid-19 Vaccination Clinic  Name:  [name]    MRN: [id number] DOB: 1971/01/11  04/06/2020  Mr. Linville was observed post Covid-19 immunization for 15 minutes without incident. He was provided with Vaccine Information Sheet and instruction to access the V-Safe system.   Mr. Yerby was instructed to call 911 with any severe reactions post vaccine: Marland Kitchen Difficulty breathing  . Swelling of face and throat  . A fast heartbeat  . A bad rash all over body  . Dizziness and weakness   Immunizations Administered    Name Date Dose VIS Date Route   Pfizer COVID-19 Vaccine 04/06/2020 11:50 AM 0.3 mL 12/08/2019 Intramuscular   Manufacturer: [place]   Lot: [id number]   [place]: [id number]

## 2020-04-27 ENCOUNTER — Ambulatory Visit: Payer: BC Managed Care – PPO | Attending: Internal Medicine

## 2020-04-27 DIAGNOSIS — Z23 Encounter for immunization: Secondary | ICD-10-CM

## 2020-04-27 NOTE — Progress Notes (Signed)
   Covid-19 Vaccination Clinic  Name:  Darin Smith    MRN: [id number] DOB: 1971/02/20  04/27/2020  Darin Smith was observed post Covid-19 immunization for 15 minutes without incident. He was provided with Vaccine Information Sheet and instruction to access the V-Safe system.   Darin Smith was instructed to call 911 with any severe reactions post vaccine: Marland Kitchen Difficulty breathing  . Swelling of face and throat  . A fast heartbeat  . A bad rash all over body  . Dizziness and weakness   Immunizations Administered    Name Date Dose VIS Date Route   Pfizer COVID-19 Vaccine 04/27/2020 10:04 AM 0.3 mL 02/21/2019 Intramuscular   Manufacturer: [place]   Lot: [id number]   [place]: [id number]

## 2020-05-01 ENCOUNTER — Other Ambulatory Visit: Payer: Self-pay | Admitting: Family

## 2020-05-03 ENCOUNTER — Other Ambulatory Visit: Payer: Self-pay

## 2020-05-03 ENCOUNTER — Ambulatory Visit: Payer: BC Managed Care – PPO | Admitting: Family

## 2020-05-03 ENCOUNTER — Encounter: Payer: Self-pay | Admitting: Family

## 2020-05-03 VITALS — BP 147/93 | HR 60 | Temp 98.0°F | Resp 16 | Ht 74.0 in | Wt 211.0 lb

## 2020-05-03 DIAGNOSIS — I10 Essential (primary) hypertension: Secondary | ICD-10-CM

## 2020-05-03 DIAGNOSIS — K219 Gastro-esophageal reflux disease without esophagitis: Secondary | ICD-10-CM | POA: Diagnosis not present

## 2020-05-03 DIAGNOSIS — J309 Allergic rhinitis, unspecified: Secondary | ICD-10-CM

## 2020-05-03 LAB — BASIC METABOLIC PANEL
BUN: 14 mg/dL (ref 6–23)
CO2: 29 mEq/L (ref 19–32)
Calcium: 9.5 mg/dL (ref 8.4–10.5)
Chloride: 105 mEq/L (ref 96–112)
Creatinine, Ser: 1.23 mg/dL (ref 0.40–1.50)
GFR: 75.81 mL/min (ref >60.00)
Glucose, Bld: 98 mg/dL (ref 70–99)
Potassium: 4.3 mEq/L (ref 3.5–5.1)
Sodium: 141 mEq/L (ref 135–145)

## 2020-05-03 NOTE — Patient Instructions (Signed)
Please check blood pressure once daily for 4-5 days.  Send me your readings via mychart.

## 2020-05-03 NOTE — Progress Notes (Signed)
Subjective:    Patient ID: Darin Smith, male    DOB: 04/01/1971, 50 y.o.   MRN: [id number]  HPI  HTN- BP meds include amlodipine 10mg , toprol xl 50mg .  BP Readings from Last 3 Encounters:  05/03/20 (!) 147/93  11/29/19 140/82  11/03/19 126/78   Seasonal allergies- on zyrtec. Reports overall stable.    GERD- uses every day. Reports that symptoms are well controlled.   Has nocturia- was started on myrbetriq by his urologist and notes that this has helped a lot.   Review of Systems See HPI  Past Medical History:  Diagnosis Date  . Allergy   . 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 Diverticulitis   . GERD (gastroesophageal reflux disease)   . History of colon polyps   . Hypertension   . OAB (overactive bladder)   . Rupture spleen age 27   hx of abdominal trauma  . Seasonal allergies      Social History   Socioeconomic History  . Marital status: Married    Spouse name: Not on file  . Number of children: Not on file  . Years of education: Not on file  . Highest education level: Not on file  Occupational History  . Not on file  Tobacco Use  . Smoking status: Current Every Day Smoker    Types: Cigars  . Smokeless tobacco: Never Used  . Tobacco comment: 1-2 cigar a day  Substance and Sexual Activity  . Alcohol use: Yes    Alcohol/week: 1.0 standard drinks    Types: 1 Shots of liquor per week    Comment: daily  . Drug use: No  . Sexual activity: Not on file  Other Topics Concern  . 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 They live in [place] 7/15                  Social Determinants of Health   Financial Resource Strain:   . Difficulty of Paying Living Expenses:   Food Insecurity:   . Worried About Charity fundraiser in the Last Year:   . Arboriculturist in the Last Year:     Transportation Needs:   . Film/video editor (Medical):   Marland Kitchen Lack of Transportation (Non-Medical):   Physical Activity:   . Days of Exercise per Week:   . Minutes of Exercise per Session:   Stress:   . Feeling of Stress :   Social Connections:   . Frequency of Communication with Friends and Family:   . Frequency of Social Gatherings with Friends and Family:   . Attends Religious Services:   . Active Member of Clubs or Organizations:   . Attends Archivist Meetings:   Marland Kitchen Marital Status:   Intimate Partner Violence:   . Fear of Current or Ex-Partner:   . Emotionally Abused:   Marland Kitchen Physically Abused:   . Sexually Abused:     Past Surgical History:  Procedure Laterality Date  . BIOPSY PROSTATE  5/13   Dr. Lowella Bandy- negative  . COLONOSCOPY  09/12/2019  . LAPAROSCOPIC PARTIAL COLECTOMY Right 10/11/2019   Procedure: LAPAROSCOPIC RIGHT HEMICOLECTOMY;  Surgeon: Ileana Roup, MD;  Location: [facility];  Service: General;  Laterality: Right;  . SPLENECTOMY  Family History  Problem Relation Age of Onset  . Lupus Mother   . Vision loss Mother        in left eye  . Hypertension Father   . COPD Father   . Obesity Sister   . Thyroid disease Sister   . Multiple sclerosis Sister   . Cancer Neg Hx        prostate  . Colon cancer Neg Hx   . Colon polyps Neg Hx   . Esophageal cancer Neg Hx   . Rectal cancer Neg Hx   . Stomach cancer Neg Hx     No Known Allergies  Current Outpatient Medications on File Prior to Visit  Medication Sig Dispense Refill  . amLODipine (NORVASC) 10 MG tablet TAKE 1 TABLET BY MOUTH EVERY DAY 90 tablet 1  . cetirizine (ZYRTEC) 10 MG tablet Take 10 mg by mouth daily.    . metoprolol succinate (TOPROL-XL) 50 MG 24 hr tablet Take 1 tablet (50 mg total) by mouth daily. Take with or immediately following a meal. 90 tablet 1  . mirabegron ER (MYRBETRIQ) 25 MG TB24 tablet Take 25 mg by mouth daily.    Marland Kitchen omeprazole (PRILOSEC) 20 MG capsule  TAKE 1 CAPSULE BY MOUTH EVERY DAY 90 capsule 1   No current facility-administered medications on file prior to visit.    BP (!) 147/93 (BP Location: Right Arm, Patient Position: Sitting, Cuff Size: Large)   Pulse 60   Temp 98 F (36.7 C) (Temporal)   Resp 16   Ht 6\' 2"  (1.88 m)   Wt 211 lb (95.7 kg)   SpO2 100%   BMI 27.09 kg/m       Objective:   Physical Exam Constitutional:      General: He is not in acute distress.    Appearance: He is well-developed.  HENT:     Head: Normocephalic and atraumatic.  Cardiovascular:     Rate and Rhythm: Normal rate and regular rhythm.     Heart sounds: No murmur.  Pulmonary:     Effort: Pulmonary effort is normal. No respiratory distress.     Breath sounds: Normal breath sounds. No wheezing or rales.  Skin:    General: Skin is warm and dry.  Neurological:     Mental Status: He is alert and oriented to person, place, and time.  Psychiatric:        Behavior: Behavior normal.        Thought Content: Thought content normal.           Assessment & Plan:  HTN- bp mildly elevated today. Pt had just taken his AM meds prior to coming in this AM.  Advised pt to check his blood pressure once daily at home for 4-5 days and send me his readings via mychart. Check follow up bmet.   Seasonal allergies- stable with zyrtec.  GERD- stable on omeprazole. I advised pt to decrease to QOD and if stable trial off of omeprazole.  This visit occurred during the SARS-CoV-2 public health emergency.  Safety protocols were in place, including screening questions prior to the visit, additional usage of staff PPE, and extensive cleaning of exam room while observing appropriate contact time as indicated for disinfecting solutions.

## 2020-06-24 ENCOUNTER — Other Ambulatory Visit: Payer: Self-pay | Admitting: Family

## 2020-07-25 ENCOUNTER — Ambulatory Visit: Payer: BC Managed Care – PPO | Admitting: Family

## 2020-08-30 ENCOUNTER — Telehealth: Payer: Self-pay | Admitting: Gastroenterology

## 2020-08-30 NOTE — Telephone Encounter (Signed)
Called patient in reference to referral for recall colon however there is a recall on file for 2030. Patient stated he had hs appendix and right colon removed in Oct of 2020 and Dr. Dema Severin is requesting he gets a repeat colonoscopy done prior to his one year follow up. Please advise

## 2020-08-30 NOTE — Telephone Encounter (Signed)
Dr Ardis Hughs please see pt question regarding recall colon.

## 2020-08-31 NOTE — Telephone Encounter (Signed)
Darin Smith, Since this was an appendiceal tumor I wasn't planning on following him with 1,3,5 year follow up that we generally do for colon adenocarcinoma.  I'm not aware of data (or standard practice) otherwise.  If you are, please let me know.  Thanks  DJ

## 2020-09-03 NOTE — Telephone Encounter (Signed)
Agreed - I follow them clinically with at least annual appointments. A lot of our non-benign surveillance appointments have checklists we complete when scheduling so this was likely from that process. Thanks for checking!  Gerald Stabs

## 2020-09-22 ENCOUNTER — Other Ambulatory Visit: Payer: Self-pay | Admitting: Family

## 2020-10-13 ENCOUNTER — Other Ambulatory Visit: Payer: Self-pay | Admitting: Family

## 2020-10-27 ENCOUNTER — Other Ambulatory Visit: Payer: Self-pay | Admitting: Family

## 2020-10-29 ENCOUNTER — Other Ambulatory Visit: Payer: Self-pay | Admitting: Family

## 2020-11-05 ENCOUNTER — Other Ambulatory Visit: Payer: Self-pay

## 2020-11-05 ENCOUNTER — Ambulatory Visit (INDEPENDENT_AMBULATORY_CARE_PROVIDER_SITE_OTHER): Payer: BC Managed Care – PPO | Admitting: Family

## 2020-11-05 ENCOUNTER — Encounter: Payer: Self-pay | Admitting: Family

## 2020-11-05 ENCOUNTER — Other Ambulatory Visit: Payer: Self-pay | Admitting: Surgery

## 2020-11-05 VITALS — BP 141/86 | HR 70 | Temp 98.5°F | Resp 16 | Ht 73.0 in | Wt 223.0 lb

## 2020-11-05 DIAGNOSIS — D373 Neoplasm of uncertain behavior of appendix: Secondary | ICD-10-CM

## 2020-11-05 DIAGNOSIS — R635 Abnormal weight gain: Secondary | ICD-10-CM

## 2020-11-05 DIAGNOSIS — Z0000 Encounter for general adult medical examination without abnormal findings: Secondary | ICD-10-CM | POA: Diagnosis not present

## 2020-11-05 DIAGNOSIS — E781 Pure hyperglyceridemia: Secondary | ICD-10-CM | POA: Diagnosis not present

## 2020-11-05 DIAGNOSIS — Z23 Encounter for immunization: Secondary | ICD-10-CM | POA: Diagnosis not present

## 2020-11-05 DIAGNOSIS — I10 Essential (primary) hypertension: Secondary | ICD-10-CM

## 2020-11-05 LAB — COMPREHENSIVE METABOLIC PANEL
ALT: 28 U/L (ref 0–53)
AST: 25 U/L (ref 0–37)
Albumin: 4.4 g/dL (ref 3.5–5.2)
Alkaline Phosphatase: 57 U/L (ref 39–117)
BUN: 12 mg/dL (ref 6–23)
CO2: 28 mEq/L (ref 19–32)
Calcium: 9.7 mg/dL (ref 8.4–10.5)
Chloride: 103 mEq/L (ref 96–112)
Creatinine, Ser: 1.31 mg/dL (ref 0.40–1.50)
GFR: 64.11 mL/min (ref >60.00)
Glucose, Bld: 79 mg/dL (ref 70–99)
Potassium: 4.4 mEq/L (ref 3.5–5.1)
Sodium: 139 mEq/L (ref 135–145)
Total Bilirubin: 0.6 mg/dL (ref 0.2–1.2)
Total Protein: 7 g/dL (ref 6.0–8.3)

## 2020-11-05 LAB — LIPID PANEL
Cholesterol: 210 mg/dL — ABNORMAL HIGH (ref 0–200)
HDL: 46.1 mg/dL (ref >39.00)
LDL Cholesterol: 126 mg/dL — ABNORMAL HIGH (ref 0–99)
NonHDL: 163.77
Total CHOL/HDL Ratio: 5
Triglycerides: 188 mg/dL — ABNORMAL HIGH (ref 0.0–149.0)
VLDL: 37.6 mg/dL (ref 0.0–40.0)

## 2020-11-05 LAB — TSH: TSH: 0.95 u[IU]/mL (ref 0.35–4.50)

## 2020-11-05 NOTE — Progress Notes (Signed)
Subjective:    Patient ID: Darin Smith, male    DOB: Aug 15, 1971, 49 y.o.   MRN: [id number]  HPI  Patient is a 49 yr old male who presents today for his annual physical.  Patient presents today for complete physical.  Immunizations:  Flu shot today.   Diet: diet is fair Wt Readings from Last 3 Encounters:  11/05/20 223 lb (101.2 kg)  05/03/20 211 lb (95.7 kg)  11/29/19 208 lb (94.3 kg)   Eating more fruit.  Trying to avoid junk food.  Less active in his new job.   Exercise: very little  PSA: had prostate biopsy 2013- Dr. Janice Norrie.  Lab Results  Component Value Date   PSA 4.01 (H) 09/12/2011   PSA 3.83 08/04/2011   PSA 1.78 05/14/2009  He continues to follow with urology (Dr Gloriann Loan), he is following once a year.  Reports last visit PSA was trending down.  Colonoscopy: 09/12/2019 Dental: up to date Vision:  due  BP Readings from Last 3 Encounters:  11/05/20 (!) 141/86  05/03/20 (!) 147/93  11/29/19 140/82     Review of Systems  Constitutional: Positive for unexpected weight change.  HENT: Negative for rhinorrhea.   Eyes: Negative for visual disturbance.  Respiratory: Negative for cough and shortness of breath.   Cardiovascular: Negative for chest pain.  Gastrointestinal: Negative for constipation and diarrhea.  Genitourinary: Negative for difficulty urinating, dysuria and frequency.  Musculoskeletal: Negative for arthralgias and myalgias.  Skin: Negative for rash.  Neurological: Negative for headaches.  Hematological: Negative for adenopathy.  Psychiatric/Behavioral:       Denies depression/anxiety   Past Medical History:  Diagnosis Date  . Allergy   . 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 Diverticulitis   . GERD (gastroesophageal reflux disease)   . History of colon polyps   . Hypertension   . OAB (overactive bladder)   . Rupture spleen age 58   hx of abdominal trauma  . Seasonal allergies      Social History    Socioeconomic History  . Marital status: Married    Spouse name: Not on file  . Number of children: Not on file  . Years of education: Not on file  . Highest education level: Not on file  Occupational History  . Not on file  Tobacco Use  . Smoking status: Current Every Day Smoker    Types: Cigars  . Smokeless tobacco: Never Used  . Tobacco comment: 1-2 cigar a day  Vaping Use  . Vaping Use: Never used  Substance and Sexual Activity  . Alcohol use: Yes    Alcohol/week: 1.0 standard drink    Types: 1 Shots of liquor per week    Comment: daily  . Drug use: No  . Sexual activity: Not on file  Other Topics Concern  . 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 They live in [place] 7/15                  Social Determinants of Health   Financial Resource Strain:   . Difficulty of Paying Living Expenses: Not on file  Food Insecurity:   . Worried About Charity fundraiser in the Last Year: Not on file  . Ran Out of Food in the Last Year: Not on file  Transportation Needs:   . Film/video editor (Medical): Not on file  . Lack of Transportation (Non-Medical): Not on file  Physical Activity:   . Days of Exercise per Week: Not on file  . Minutes of Exercise per Session: Not on file  Stress:   . Feeling of Stress : Not on file  Social Connections:   . Frequency of Communication with Friends and Family: Not on file  . Frequency of Social Gatherings with Friends and Family: Not on file  . Attends Religious Services: Not on file  . Active Member of Clubs or Organizations: Not on file  . Attends Archivist Meetings: Not on file  . Marital Status: Not on file  Intimate Partner Violence:   . Fear of Current or Ex-Partner: Not on file  . Emotionally Abused: Not on file  . Physically Abused: Not on file  . Sexually Abused: Not on file     Past Surgical History:  Procedure Laterality Date  . BIOPSY PROSTATE  5/13   Dr. Lowella Bandy- negative  . COLONOSCOPY  09/12/2019  . LAPAROSCOPIC PARTIAL COLECTOMY Right 10/11/2019   Procedure: LAPAROSCOPIC RIGHT HEMICOLECTOMY;  Surgeon: Ileana Roup, MD;  Location: [facility];  Service: General;  Laterality: Right;  . SPLENECTOMY      Family History  Problem Relation Age of Onset  . Lupus Mother   . Vision loss Mother        in left eye  . Hypertension Father   . COPD Father   . Obesity Sister   . Thyroid disease Sister   . Multiple sclerosis Sister   . Cancer Neg Hx        prostate  . Colon cancer Neg Hx   . Colon polyps Neg Hx   . Esophageal cancer Neg Hx   . Rectal cancer Neg Hx   . Stomach cancer Neg Hx     No Known Allergies  Current Outpatient Medications on File Prior to Visit  Medication Sig Dispense Refill  . amLODipine (NORVASC) 10 MG tablet Take 1 tablet (10 mg total) by mouth daily. 30 tablet 0  . loratadine (ALLERGY) 10 MG tablet Take 10 mg by mouth daily.    . metoprolol succinate (TOPROL-XL) 50 MG 24 hr tablet TAKE 1 TABLET (50 MG TOTAL) BY MOUTH DAILY. TAKE WITH OR IMMEDIATELY FOLLOWING A MEAL. 90 tablet 1  . mirabegron ER (MYRBETRIQ) 25 MG TB24 tablet Take 25 mg by mouth daily.    Marland Kitchen omeprazole (PRILOSEC) 20 MG capsule Take 1 capsule (20 mg total) by mouth daily. 30 capsule 0   No current facility-administered medications on file prior to visit.    BP (!) 141/86   Pulse 70   Temp 98.5 F (36.9 C) (Oral)   Resp 16   Ht 6\' 1"  (1.854 m)   Wt 223 lb (101.2 kg)   SpO2 99%   BMI 29.42 kg/m       Objective:   Physical Exam  Physical Exam  Constitutional: He is oriented to person, place, and time. He appears well-developed and well-nourished. No distress.  HENT:  Head: Normocephalic and atraumatic.  Right Ear: Tympanic membrane and ear canal normal.  Left Ear: Tympanic membrane and ear canal normal.  Mouth/Throat: not examined Eyes:  Pupils are equal, round, and reactive to light. No scleral icterus.  Neck: Normal range of motion. No thyromegaly present.  Cardiovascular: Normal rate and regular rhythm.   No murmur heard. Pulmonary/Chest: Effort normal and breath  sounds normal. No respiratory distress. He has no wheezes. He has no rales. He exhibits no tenderness.  Abdominal: Soft. Bowel sounds are normal. He exhibits no distension and no mass. There is no tenderness. There is no rebound and no guarding.  Musculoskeletal: He exhibits no edema.  Lymphadenopathy:    He has no cervical adenopathy.  Neurological: He is alert and oriented to person, place, and time. He has normal patellar reflexes. He exhibits normal muscle tone. Coordination normal.  Skin: Skin is warm and dry.  Psychiatric: He has a normal mood and affect. His behavior is normal. Judgment and thought content normal.           Assessment & Plan:  Preventative care- discussed healthy diet exercise and weight loss. Flu shot today.  Pneumonia shots up to date.  Meningitis shots up to date. Tetanus up to date.  Colo up to date. PSA is being followed by his urologist. Pt counseled on smoking cessation.   This visit occurred during the SARS-CoV-2 public health emergency.  Safety protocols were in place, including screening questions prior to the visit, additional usage of staff PPE, and extensive cleaning of exam room while observing appropriate contact time as indicated for disinfecting solutions.           Assessment & Plan:

## 2020-11-05 NOTE — Patient Instructions (Addendum)
Please schedule a routine eye exam.  Please set a quit date for quitting smoking.    Steps to Quit Smoking Smoking tobacco is the leading cause of preventable death. It can affect almost every organ in the body. Smoking puts you and those around you at risk for developing many serious chronic diseases. Quitting smoking can be difficult, but it is one of the best things that you can do for your health. It is never too late to quit. How do I get ready to quit? When you decide to quit smoking, create a plan to help you succeed. Before you quit:  Pick a date to quit. Set a date within the next 2 weeks to give you time to prepare.  Write down the reasons why you are quitting. Keep this list in places where you will see it often.  Tell your family, friends, and co-workers that you are quitting. Support from your loved ones can make quitting easier.  Talk with your health care provider about your options for quitting smoking.  Find out what treatment options are covered by your health insurance.  Identify people, places, things, and activities that make you want to smoke (triggers). Avoid them. What first steps can I take to quit smoking?  Throw away all cigarettes at home, at work, and in your car.  Throw away smoking accessories, such as Scientist, research (medical).  Clean your car. Make sure to empty the ashtray.  Clean your home, including curtains and carpets. What strategies can I use to quit smoking? Talk with your health care provider about combining strategies, such as taking medicines while you are also receiving in-person counseling. Using these two strategies together makes you more likely to succeed in quitting than if you used either strategy on its own.  If you are pregnant or breastfeeding, talk with your health care provider about finding counseling or other support strategies to quit smoking. Do not take medicine to help you quit smoking unless your health care provider tells you  to do so. To quit smoking: Quit right away  Quit smoking completely, instead of gradually reducing how much you smoke over a period of time. Research shows that stopping smoking right away is more successful than gradually quitting.  Attend in-person counseling to help you build problem-solving skills. You are more likely to succeed in quitting if you attend counseling sessions regularly. Even short sessions of 10 minutes can be effective. Take medicine You may take medicines to help you quit smoking. Some medicines require a prescription and some you can purchase over-the-counter. Medicines may have nicotine in them to replace the nicotine in cigarettes. Medicines may:  Help to stop cravings.  Help to relieve withdrawal symptoms. Your health care provider may recommend:  Nicotine patches, gum, or lozenges.  Nicotine inhalers or sprays.  Non-nicotine medicine that is taken by mouth. Find resources Find resources and support systems that can help you to quit smoking and remain smoke-free after you quit. These resources are most helpful when you use them often. They include:  Online chats with a Social worker.  Telephone quitlines.  Printed Furniture conservator/restorer.  Support groups or group counseling.  Text messaging programs.  Mobile phone apps or applications. Use apps that can help you stick to your quit plan by providing reminders, tips, and encouragement. There are many free apps for mobile devices as well as websites. Examples include Quit Guide from the [employer] and smokefree.gov What things can I do to make it easier to quit?  Reach out to your family and friends for support and encouragement. Call telephone quitlines (1-800-QUIT-NOW), reach out to support groups, or work with a counselor for support.  Ask people who smoke to avoid smoking around you.  Avoid places that trigger you to smoke, such as bars, parties, or smoke-break areas at work.  Spend time with people who do not  smoke.  Lessen the stress in your life. Stress can be a smoking trigger for some people. To lessen stress, try: ? Exercising regularly. ? Doing deep-breathing exercises. ? Doing yoga. ? Meditating. ? Performing a body scan. This involves closing your eyes, scanning your body from head to toe, and noticing which parts of your body are particularly tense. Try to relax the muscles in those areas. How will I feel when I quit smoking? Day 1 to 3 weeks Within the first 24 hours of quitting smoking, you may start to feel withdrawal symptoms. These symptoms are usually most noticeable 2-3 days after quitting, but they usually do not last for more than 2-3 weeks. You may experience these symptoms:  Mood swings.  Restlessness, anxiety, or irritability.  Trouble concentrating.  Dizziness.  Strong cravings for sugary foods and nicotine.  Mild weight gain.  Constipation.  Nausea.  Coughing or a sore throat.  Changes in how the medicines that you take for unrelated issues work in your body.  Depression.  Trouble sleeping (insomnia). Week 3 and afterward After the first 2-3 weeks of quitting, you may start to notice more positive results, such as:  Improved sense of smell and taste.  Decreased coughing and sore throat.  Slower heart rate.  Lower blood pressure.  Clearer skin.  The ability to breathe more easily.  Fewer sick days. Quitting smoking can be very challenging. Do not get discouraged if you are not successful the first time. Some people need to make many attempts to quit before they achieve long-term success. Do your best to stick to your quit plan, and talk with your health care provider if you have any questions or concerns. Summary  Smoking tobacco is the leading cause of preventable death. Quitting smoking is one of the best things that you can do for your health.  When you decide to quit smoking, create a plan to help you succeed.  Quit smoking right away,  not slowly over a period of time.  When you start quitting, seek help from your health care provider, family, or friends. This information is not intended to replace advice given to you by your health care provider. Make sure you discuss any questions you have with your health care provider. Document Revised: 09/08/2019 Document Reviewed: 03/04/2019 Elsevier Patient Education  [place].

## 2020-11-09 ENCOUNTER — Other Ambulatory Visit: Payer: Self-pay | Admitting: Family

## 2020-11-16 ENCOUNTER — Other Ambulatory Visit: Payer: Self-pay | Admitting: Family

## 2020-11-18 MED ORDER — AMLODIPINE BESYLATE 10 MG PO TABS: 10.0000 mg | ORAL_TABLET | Freq: Every day | ORAL | 1 refills | Status: DC

## 2020-11-25 ENCOUNTER — Ambulatory Visit
Admission: RE | Admit: 2020-11-25 | Discharge: 2020-11-25 | Disposition: A | Discharge status: Home / Self Care | Payer: BC Managed Care – PPO | Source: Ambulatory Visit | Attending: Surgery | Admitting: Surgery

## 2020-11-25 DIAGNOSIS — D373 Neoplasm of uncertain behavior of appendix: Secondary | ICD-10-CM

## 2020-11-25 MED ORDER — IOPAMIDOL (ISOVUE-300) INJECTION 61%
100.0000 mL | Freq: Once | INTRAVENOUS | Status: AC | PRN
  Administered 2020-11-25: 100 mL via INTRAVENOUS

## 2020-11-27 ENCOUNTER — Other Ambulatory Visit: Payer: Self-pay | Admitting: Surgery

## 2020-11-27 DIAGNOSIS — D373 Neoplasm of uncertain behavior of appendix: Secondary | ICD-10-CM

## 2020-11-28 ENCOUNTER — Telehealth: Payer: Self-pay | Admitting: Family

## 2020-11-28 MED ORDER — OMEPRAZOLE 20 MG PO CPDR
20.0000 mg | DELAYED_RELEASE_CAPSULE | Freq: Every day | ORAL | 1 refills | Status: DC
Start: 2020-11-28 — End: 2021-05-26

## 2020-11-28 NOTE — Telephone Encounter (Signed)
RX SENT

## 2020-11-28 NOTE — Telephone Encounter (Signed)
Patient states he would like a 90day supply of medication sent to pharmacy ...  omeprazole (PRILOSEC) 20 MG capsule [[id number]]    [id number] - [place], [place] - [address]  [place], [place] [address]  Phone:  [phone] Fax:  [phone]  DEA #:  [id number]

## 2020-12-09 ENCOUNTER — Telehealth: Payer: Self-pay | Admitting: Family

## 2020-12-09 NOTE — Telephone Encounter (Signed)
Medication: amLODipine (NORVASC) 10 MG tablet [[id number]]       Has the patient contacted their pharmacy?  (If no, request that the patient contact the pharmacy for the refill.) (If yes, when and what did the pharmacy advise?)     Preferred Pharmacy (with phone number or street name):  [id number] - [place], [place] - [address]  [place], [place] [address]  Phone:  [phone] Fax:  [phone]  DEA #:  [id number]    Agent: Please be advised that RX refills may take up to 3 business days. We ask that you follow-up with your pharmacy.

## 2020-12-11 NOTE — Telephone Encounter (Signed)
rx was sent 11-18-20 for 90 days supply and one refill. Patient advised to call pharmacy

## 2020-12-16 ENCOUNTER — Ambulatory Visit
Admission: RE | Admit: 2020-12-16 | Discharge: 2020-12-16 | Disposition: A | Discharge status: Home / Self Care | Payer: BC Managed Care – PPO | Source: Ambulatory Visit | Attending: Surgery | Admitting: Surgery

## 2020-12-16 DIAGNOSIS — D373 Neoplasm of uncertain behavior of appendix: Secondary | ICD-10-CM

## 2020-12-16 MED ORDER — GADOBENATE DIMEGLUMINE 529 MG/ML IV SOLN
20.0000 mL | Freq: Once | INTRAVENOUS | Status: AC | PRN
  Administered 2020-12-16: 20 mL via INTRAVENOUS

## 2020-12-31 LAB — PSA: PSA: 4.88

## 2021-02-07 ENCOUNTER — Other Ambulatory Visit: Payer: Self-pay | Admitting: Urology

## 2021-02-07 DIAGNOSIS — R972 Elevated prostate specific antigen [PSA]: Secondary | ICD-10-CM

## 2021-03-08 ENCOUNTER — Ambulatory Visit
Admission: RE | Admit: 2021-03-08 | Discharge: 2021-03-08 | Disposition: A | Discharge status: Home / Self Care | Payer: BC Managed Care – PPO | Source: Ambulatory Visit | Attending: Urology | Admitting: Urology

## 2021-03-08 ENCOUNTER — Other Ambulatory Visit: Payer: Self-pay

## 2021-03-08 DIAGNOSIS — R972 Elevated prostate specific antigen [PSA]: Secondary | ICD-10-CM

## 2021-03-08 MED ORDER — GADOBENATE DIMEGLUMINE 529 MG/ML IV SOLN
20.0000 mL | Freq: Once | INTRAVENOUS | Status: AC | PRN
  Administered 2021-03-08: 20 mL via INTRAVENOUS

## 2021-04-15 ENCOUNTER — Other Ambulatory Visit: Payer: Self-pay | Admitting: Family

## 2021-05-09 ENCOUNTER — Ambulatory Visit: Payer: BC Managed Care – PPO | Admitting: Family

## 2021-05-16 ENCOUNTER — Other Ambulatory Visit: Payer: Self-pay

## 2021-05-16 ENCOUNTER — Ambulatory Visit: Payer: BC Managed Care – PPO | Admitting: Family

## 2021-05-16 DIAGNOSIS — I10 Essential (primary) hypertension: Secondary | ICD-10-CM

## 2021-05-16 DIAGNOSIS — K219 Gastro-esophageal reflux disease without esophagitis: Secondary | ICD-10-CM | POA: Diagnosis not present

## 2021-05-16 DIAGNOSIS — Z0000 Encounter for general adult medical examination without abnormal findings: Secondary | ICD-10-CM

## 2021-05-16 NOTE — Progress Notes (Signed)
Subjective:   By signing my name below, I, [name], attest that this documentation has been prepared under the direction and in the presence of Debbrah Alar NP. 05/16/2021      Patient ID: Darin Smith, male    DOB: 08-Oct-1971, 50 y.o.   MRN: [id number]  No chief complaint on file.   HPI Patient is in today for a office visit.  He has taken 2 Covid-19 vaccines. He is willing to get the 3rd Covid vaccine.  GERD- He is taking 20 mg omeprozole daily PO to manage his symptoms. He reports not having as many symptoms when off it. Hypertension- He is taking 50 mg metoprolol daily PO and 10 mg amlodipine daily PO to manage his hypertension. His blood pressure had not improved nor worsened since his last visit. BP Readings from Last 3 Encounters:  05/16/21 (!) 142/85  11/05/20 (!) 141/86  05/03/20 (!) 147/93    Past Medical History:  Diagnosis Date  . Allergy   . 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 Diverticulitis   . GERD (gastroesophageal reflux disease)   . History of colon polyps   . Hypertension   . OAB (overactive bladder)   . Rupture spleen age 45   hx of abdominal trauma  . Seasonal allergies     Past Surgical History:  Procedure Laterality Date  . BIOPSY PROSTATE  5/13   Dr. Lowella Bandy- negative  . COLONOSCOPY  09/12/2019  . LAPAROSCOPIC PARTIAL COLECTOMY Right 10/11/2019   Procedure: LAPAROSCOPIC RIGHT HEMICOLECTOMY;  Surgeon: Ileana Roup, MD;  Location: [facility];  Service: General;  Laterality: Right;  . SPLENECTOMY      Family History  Problem Relation Age of Onset  . Lupus Mother   . Vision loss Mother        in left eye  . Hypertension Father   . COPD Father   . Obesity Sister   . Thyroid disease Sister   . Multiple sclerosis Sister   . Cancer Neg Hx        prostate  . Colon cancer Neg Hx   . Colon polyps Neg Hx   . Esophageal cancer Neg Hx   . Rectal cancer Neg Hx   . Stomach cancer Neg Hx      Social History   Socioeconomic History  . Marital status: Married    Spouse name: Not on file  . Number of children: Not on file  . Years of education: Not on file  . Highest education level: Not on file  Occupational History  . Not on file  Tobacco Use  . Smoking status: Current Every Day Smoker    Types: Cigars  . Smokeless tobacco: Never Used  . Tobacco comment: 1-2 cigar a day  Vaping Use  . Vaping Use: Never used  Substance and Sexual Activity  . Alcohol use: Yes    Alcohol/week: 1.0 standard drink    Types: 1 Shots of liquor per week    Comment: daily  . Drug use: No  . Sexual activity: Not on file  Other Topics Concern  . 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 They live in [place] 7/15                  Social Determinants of  Health   Financial Resource Strain: Not on file  Food Insecurity: Not on file  Transportation Needs: Not on file  Physical Activity: Not on file  Stress: Not on file  Social Connections: Not on file  Intimate Partner Violence: Not on file    Outpatient Medications Prior to Visit  Medication Sig Dispense Refill  . amLODipine (NORVASC) 10 MG tablet Take 1 tablet (10 mg total) by mouth daily. 90 tablet 1  . loratadine (ALLERGY) 10 MG tablet Take 10 mg by mouth daily.    . metoprolol succinate (TOPROL-XL) 50 MG 24 hr tablet Take 1 tablet (50 mg total) by mouth daily. Take with or immediately following a meal. 90 tablet 0  . mirabegron ER (MYRBETRIQ) 25 MG TB24 tablet Take 25 mg by mouth daily.    Marland Kitchen omeprazole (PRILOSEC) 20 MG capsule Take 1 capsule (20 mg total) by mouth daily. 90 capsule 1   No facility-administered medications prior to visit.    No Known Allergies  ROS     Objective:    Physical Exam Constitutional:      Appearance: Normal appearance.  HENT:     Head: Normocephalic and  atraumatic.     Right Ear: External ear normal.     Left Ear: External ear normal.  Eyes:     Extraocular Movements: Extraocular movements intact.     Pupils: Pupils are equal, round, and reactive to light.  Cardiovascular:     Rate and Rhythm: Normal rate and regular rhythm.     Pulses: Normal pulses.     Heart sounds: Normal heart sounds. No murmur heard. No gallop.      Comments: Blood pressure measured 142/85 Pulmonary:     Effort: Pulmonary effort is normal. No respiratory distress.     Breath sounds: Normal breath sounds. No wheezing, rhonchi or rales.  Skin:    General: Skin is warm and dry.  Neurological:     Mental Status: He is alert and oriented to person, place, and time.  Psychiatric:        Behavior: Behavior normal.     There were no vitals taken for this visit. Wt Readings from Last 3 Encounters:  11/05/20 223 lb (101.2 kg)  05/03/20 211 lb (95.7 kg)  11/29/19 208 lb (94.3 kg)    Diabetic Foot Exam - Simple   No data filed    Lab Results  Component Value Date   WBC 6.5 11/03/2019   HGB 13.1 11/03/2019   HCT 39.2 11/03/2019   PLT 400.0 11/03/2019   GLUCOSE 79 11/05/2020   CHOL 210 (H) 11/05/2020   TRIG 188.0 (H) 11/05/2020   HDL 46.10 11/05/2020   LDLDIRECT 108.0 11/03/2019   LDLCALC 126 (H) 11/05/2020   ALT 28 11/05/2020   AST 25 11/05/2020   NA 139 11/05/2020   K 4.4 11/05/2020   CL 103 11/05/2020   CREATININE 1.31 11/05/2020   BUN 12 11/05/2020   CO2 28 11/05/2020   TSH 0.95 11/05/2020   PSA 4.88 12/31/2020   INR 1.0 10/11/2019   HGBA1C 5.6 10/09/2019    Lab Results  Component Value Date   TSH 0.95 11/05/2020   Lab Results  Component Value Date   WBC 6.5 11/03/2019   HGB 13.1 11/03/2019   HCT 39.2 11/03/2019   MCV 93.4 11/03/2019   PLT 400.0 11/03/2019   Lab Results  Component Value Date   NA 139 11/05/2020   K 4.4 11/05/2020   CO2 28 11/05/2020  GLUCOSE 79 11/05/2020   BUN 12 11/05/2020   CREATININE 1.31 11/05/2020    BILITOT 0.6 11/05/2020   ALKPHOS 57 11/05/2020   AST 25 11/05/2020   ALT 28 11/05/2020   PROT 7.0 11/05/2020   ALBUMIN 4.4 11/05/2020   CALCIUM 9.7 11/05/2020   ANIONGAP 7 10/22/2019   GFR 64.11 11/05/2020   Lab Results  Component Value Date   CHOL 210 (H) 11/05/2020   Lab Results  Component Value Date   HDL 46.10 11/05/2020   Lab Results  Component Value Date   LDLCALC 126 (H) 11/05/2020   Lab Results  Component Value Date   TRIG 188.0 (H) 11/05/2020   Lab Results  Component Value Date   CHOLHDL 5 11/05/2020   Lab Results  Component Value Date   HGBA1C 5.6 10/09/2019       Assessment & Plan:   Problem List Items Addressed This Visit   None      No orders of the defined types were placed in this encounter.   I, Debbrah Alar NP, personally preformed the services described in this documentation.  All medical record entries made by the scribe were at my direction and in my presence.  I have reviewed the chart and discharge instructions (if applicable) and agree that the record reflects my personal performance and is accurate and complete. 05/16/2021   I,[name],acting as a Education administrator for [name], NP.,have documented all relevant documentation on the behalf of [name], NP,as directed by  [name], NP while in the presence of [name], NP.   Shehryar [employer]

## 2021-05-16 NOTE — Assessment & Plan Note (Addendum)
BP Readings from Last 3 Encounters:  05/16/21 (!) 153/89  11/05/20 (!) 141/86  05/03/20 (!) 147/93   Initial bp was elevated but follow up bp was improved. Continue metoprolol 50mg  and amlodipine  10mg .

## 2021-05-16 NOTE — Assessment & Plan Note (Signed)
Recommended that he get his covid booster shot.

## 2021-05-16 NOTE — Assessment & Plan Note (Signed)
Asymptomatic on omeprazole 20mg  once daily. Advised pt to trial d/c and see if how he does.

## 2021-05-25 ENCOUNTER — Other Ambulatory Visit: Payer: Self-pay | Admitting: Family

## 2021-07-19 ENCOUNTER — Other Ambulatory Visit: Payer: Self-pay | Admitting: Family

## 2021-08-19 ENCOUNTER — Other Ambulatory Visit: Payer: Self-pay

## 2021-08-19 ENCOUNTER — Ambulatory Visit: Payer: BC Managed Care – PPO | Admitting: Family

## 2021-08-19 VITALS — BP 124/65 | HR 66 | Temp 98.4°F | Resp 16 | Wt 229.0 lb

## 2021-08-19 DIAGNOSIS — K219 Gastro-esophageal reflux disease without esophagitis: Secondary | ICD-10-CM

## 2021-08-19 DIAGNOSIS — R972 Elevated prostate specific antigen [PSA]: Secondary | ICD-10-CM

## 2021-08-19 DIAGNOSIS — E785 Hyperlipidemia, unspecified: Secondary | ICD-10-CM

## 2021-08-19 DIAGNOSIS — N3281 Overactive bladder: Secondary | ICD-10-CM

## 2021-08-19 DIAGNOSIS — I10 Essential (primary) hypertension: Secondary | ICD-10-CM | POA: Diagnosis not present

## 2021-08-19 NOTE — Progress Notes (Signed)
l °

## 2021-08-19 NOTE — Progress Notes (Signed)
Subjective:     Patient ID: Darin Smith, male    DOB: [birth year], 50 y.o.   MRN: [id number]  Chief Complaint  Patient presents with   Hypertension    Here for follow up   Hyperlipidemia    Here for follow iup    HPI  Patient is in today for follow up.  HTN- maintained on amlodipine '10mg'$ , toprol xl '50mg'$ . He takes his medication when he wakes up at 1AM   BP Readings from Last 3 Encounters:  08/19/21 124/65  05/16/21 (!) 142/85  11/05/20 (!) 141/86    GERD- maintained on omeprazole '20mg'$ .  He is using prn.   OAB- being treated with [facility] by Urology.   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 Lowella Bandy- negative   COLONOSCOPY  09/12/2019   LAPAROSCOPIC PARTIAL COLECTOMY Right 10/11/2019   Procedure: LAPAROSCOPIC RIGHT HEMICOLECTOMY;  Surgeon: Ileana Roup, MD;  Location: [facility];  Service: General;  Laterality: Right;   SPLENECTOMY      Family History  Problem Relation Age of Onset   Lupus Mother    Vision loss Mother        in left eye   Hypertension Father    COPD Father    Obesity Sister    Thyroid disease Sister    Multiple sclerosis Sister    Cancer Neg Hx        prostate   Colon cancer Neg Hx    Colon polyps Neg Hx    Esophageal cancer Neg Hx    Rectal cancer Neg Hx    Stomach cancer Neg Hx     Social History   Socioeconomic History   Marital status: Married    Spouse name: Not on file   Number of children: Not on file   Years  of education: Not on file   Highest education level: Not on file  Occupational History   Not on file  Tobacco Use   Smoking status: Every Day    Types: Cigars   Smokeless tobacco: Never   Tobacco comments:    1-2 cigar a day  Vaping Use   Vaping Use: Never used  Substance and Sexual Activity   Alcohol use: Yes    Alcohol/week: 1.0 standard drink    Types: 1 Shots of liquor per week    Comment: daily   Drug use: No   Sexual activity: Not on file  Other Topics Concern   Not on file  Social History Narrative   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 They live in [place] 7/15                  Social Determinants of Health   Financial Resource Strain: Not on file  Food  Insecurity: Not on file  Transportation Needs: Not on file  Physical Activity: Not on file  Stress: Not on file  Social Connections: Not on file  Intimate Partner Violence: Not on file    Outpatient Medications Prior to Visit  Medication Sig Dispense Refill   amLODipine (NORVASC) 10 MG tablet TAKE 1 TABLET BY MOUTH EVERY DAY 90 tablet 1   loratadine (CLARITIN) 10 MG tablet Take 10 mg by mouth daily.     metoprolol succinate (TOPROL-XL) 50 MG 24 hr tablet TAKE 1 TABLET BY MOUTH DAILY. TAKE WITH OR IMMEDIATELY FOLLOWING A MEAL. 90 tablet 0   omeprazole (PRILOSEC) 20 MG capsule TAKE 1 CAPSULE BY MOUTH EVERY DAY 90 capsule 1   Vibegron (GEMTESA) 75 MG TABS Take by mouth.     mirabegron ER (MYRBETRIQ) 25 MG TB24 tablet Take 25 mg by mouth daily.     No facility-administered medications prior to visit.    No Known Allergies  ROS    See HPI Objective:    Physical Exam Constitutional:      General: He is not in acute distress.    Appearance: He is well-developed.  HENT:     Head: Normocephalic and atraumatic.  Cardiovascular:     Rate and Rhythm: Normal rate and regular rhythm.     Heart sounds: No murmur  heard. Pulmonary:     Effort: Pulmonary effort is normal. No respiratory distress.     Breath sounds: Normal breath sounds. No wheezing or rales.  Skin:    General: Skin is warm and dry.  Neurological:     Mental Status: He is alert and oriented to person, place, and time.  Psychiatric:        Behavior: Behavior normal.        Thought Content: Thought content normal.    BP 124/65   Pulse 66   Temp 98.4 F (36.9 C) (Oral)   Resp 16   Wt 229 lb (103.9 kg)   SpO2 99%   BMI 30.21 kg/m  Wt Readings from Last 3 Encounters:  08/19/21 229 lb (103.9 kg)  05/16/21 232 lb (105.2 kg)  11/05/20 223 lb (101.2 kg)       Assessment & Plan:   Problem List Items Addressed This Visit       Unprioritized   OAB (overactive bladder)    Stable, management per urology.       HTN (hypertension)    BP is mildly elevated on amlodipine, toprol xl. I advised pt to check his blood pressure once daily for the next few days and then send me his readings via my chart.       GERD (gastroesophageal reflux disease)    Stable with PRN use of Omeprazole.       Elevated PSA    He has had 3 negative prostate biopsies and continues to follow with Urology.       Other Visit Diagnoses     Hyperlipidemia, unspecified hyperlipidemia type    -  Primary   Relevant Orders   Lipid Profile   Essential hypertension       Relevant Orders   Basic Metabolic Panel (BMET)       I have discontinued Darin L. Gaughran "Lamont"'s mirabegron ER. I am also having him maintain his loratadine, amLODipine, omeprazole, metoprolol succinate, and Gemtesa.  No orders of the defined types were placed in this encounter.

## 2021-08-20 ENCOUNTER — Telehealth: Payer: Self-pay | Admitting: Family

## 2021-08-20 DIAGNOSIS — N3281 Overactive bladder: Secondary | ICD-10-CM | POA: Insufficient documentation

## 2021-08-20 LAB — LIPID PANEL
Cholesterol: 198 mg/dL (ref 0–200)
HDL: 42.2 mg/dL (ref >39.00)
LDL Cholesterol: 121 mg/dL — ABNORMAL HIGH (ref 0–99)
NonHDL: 155.37
Total CHOL/HDL Ratio: 5
Triglycerides: 170 mg/dL — ABNORMAL HIGH (ref 0.0–149.0)
VLDL: 34 mg/dL (ref 0.0–40.0)

## 2021-08-20 LAB — BASIC METABOLIC PANEL
BUN: 15 mg/dL (ref 6–23)
CO2: 27 mEq/L (ref 19–32)
Calcium: 9.7 mg/dL (ref 8.4–10.5)
Chloride: 105 mEq/L (ref 96–112)
Creatinine, Ser: 1.43 mg/dL (ref 0.40–1.50)
GFR: 57.39 mL/min — ABNORMAL LOW (ref >60.00)
Glucose, Bld: 82 mg/dL (ref 70–99)
Potassium: 3.9 mEq/L (ref 3.5–5.1)
Sodium: 142 mEq/L (ref 135–145)

## 2021-08-20 NOTE — Telephone Encounter (Signed)
See mychart.  

## 2021-08-20 NOTE — Assessment & Plan Note (Signed)
Stable with PRN use of Omeprazole.

## 2021-08-20 NOTE — Assessment & Plan Note (Signed)
Stable, management per urology.

## 2021-08-20 NOTE — Assessment & Plan Note (Signed)
BP is mildly elevated on amlodipine, toprol xl. I advised pt to check his blood pressure once daily for the next few days and then send me his readings via my chart.

## 2021-08-20 NOTE — Assessment & Plan Note (Signed)
He has had 3 negative prostate biopsies and continues to follow with Urology.

## 2021-10-13 ENCOUNTER — Ambulatory Visit: Payer: BC Managed Care – PPO

## 2021-10-13 ENCOUNTER — Other Ambulatory Visit (HOSPITAL_BASED_OUTPATIENT_CLINIC_OR_DEPARTMENT_OTHER): Payer: Self-pay

## 2021-10-13 MED ORDER — INFLUENZA VAC SPLIT QUAD 0.5 ML IM SUSY
PREFILLED_SYRINGE | INTRAMUSCULAR | 0 refills | Status: DC
  Filled 2021-10-13: qty 0.5, 1d supply, fill #0

## 2021-10-13 NOTE — Progress Notes (Signed)
   Covid-19 Vaccination Clinic  Name:  DAIN LASETER    MRN: [id number] DOB: 1971-06-14  10/13/2021  Mr. Hewins was observed post Covid-19 immunization for 15 minutes without incident. He was provided with Vaccine Information Sheet and instruction to access the V-Safe system.   Mr. Glazier was instructed to call 911 with any severe reactions post vaccine: Difficulty breathing  Swelling of face and throat  A fast heartbeat  A bad rash all over body  Dizziness and weakness

## 2021-10-22 ENCOUNTER — Other Ambulatory Visit: Payer: Self-pay | Admitting: Family

## 2021-11-10 ENCOUNTER — Other Ambulatory Visit: Payer: Self-pay | Admitting: Family

## 2021-11-14 ENCOUNTER — Encounter: Payer: BC Managed Care – PPO | Admitting: Family

## 2021-11-14 ENCOUNTER — Encounter: Payer: Self-pay | Admitting: Family

## 2021-11-14 ENCOUNTER — Other Ambulatory Visit: Payer: Self-pay

## 2021-11-14 ENCOUNTER — Ambulatory Visit (INDEPENDENT_AMBULATORY_CARE_PROVIDER_SITE_OTHER): Payer: BC Managed Care – PPO | Admitting: Family

## 2021-11-14 VITALS — BP 148/84 | HR 83 | Temp 98.2°F | Resp 16 | Ht 73.0 in | Wt 233.0 lb

## 2021-11-14 DIAGNOSIS — Z23 Encounter for immunization: Secondary | ICD-10-CM

## 2021-11-14 DIAGNOSIS — Z0000 Encounter for general adult medical examination without abnormal findings: Secondary | ICD-10-CM | POA: Diagnosis not present

## 2021-11-14 DIAGNOSIS — I10 Essential (primary) hypertension: Secondary | ICD-10-CM | POA: Diagnosis not present

## 2021-11-14 MED ORDER — METOPROLOL SUCCINATE ER 50 MG PO TB24: 75.0000 mg | ORAL_TABLET | Freq: Every day | ORAL | 1 refills | Status: DC

## 2021-11-14 NOTE — Assessment & Plan Note (Signed)
Discussed healthy diet, exercise and weight loss. PSA is being monitored by Urology.  Shingrix #1 today.  Colo up to date, though he plans to follow up with his colorectal surgeon after the first of the year.

## 2021-11-14 NOTE — Assessment & Plan Note (Addendum)
BP Readings from Last 3 Encounters:  11/14/21 (!) 148/84  08/19/21 124/65  05/16/21 (!) 142/85   Not checking bp at home.  Will continue amlodipine 10mg  and increase toprol xl 50mg  to 75mg . He will send me his follow up readings in 1 week.

## 2021-11-14 NOTE — Patient Instructions (Signed)
Increase toprol xl 50mg  to 1.5 tabs (75mg ) once daily. Check your blood pressure and heart rate once daily for 1 week and send me your readings via mychart.

## 2021-11-14 NOTE — Addendum Note (Signed)
Addended by: Jiles Prows on: 11/14/2021 11:07 AM   Modules accepted: Orders

## 2021-11-14 NOTE — Progress Notes (Signed)
Subjective:     Patient ID: Darin Smith, male    DOB: 06-12-1971, 50 y.o.   MRN: [id number]  Chief Complaint  Patient presents with   Annual Exam    HPI  Patient presents today for complete physical.  Immunizations: Would like shingrix today.  Diet: needs improvement Wt Readings from Last 3 Encounters:  11/14/21 233 lb (105.7 kg)  08/19/21 229 lb (103.9 kg)  05/16/21 232 lb (105.2 kg)  Exercise:  plans to do jump roping.   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 Lowella Bandy- negative   COLONOSCOPY  09/12/2019   LAPAROSCOPIC PARTIAL COLECTOMY Right 10/11/2019   Procedure: LAPAROSCOPIC RIGHT HEMICOLECTOMY;  Surgeon: Ileana Roup, MD;  Location: [facility];  Service: General;  Laterality: Right;   SPLENECTOMY      Family History  Problem Relation Age of Onset   Lupus Mother    Vision loss Mother        in left eye   Hypertension Father    COPD Father    Obesity Sister    Thyroid disease Sister    Multiple sclerosis Sister    Cancer Neg Hx        prostate   Colon cancer Neg Hx    Colon polyps Neg Hx    Esophageal cancer Neg Hx    Rectal cancer Neg Hx    Stomach cancer Neg Hx     Social History   Socioeconomic History   Marital status: Married    Spouse name: Not on file   Number of children: Not on file   Years of education: Not on file   Highest education level: Not on file  Occupational History   Not on file  Tobacco Use    Smoking status: Some Days    Types: Cigars   Smokeless tobacco: Never   Tobacco comments:    Occasional cigars  Vaping Use   Vaping Use: Never used  Substance and Sexual Activity   Alcohol use: Not Currently    Alcohol/week: 1.0 standard drink    Types: 1 Shots of liquor per week    Comment: daily   Drug use: No   Sexual activity: Yes    Partners: Female  Other Topics Concern   Not on file  Social History Narrative   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 [place]   Social Determinants of Health   Financial Resource Strain: Not on file  Food Insecurity: Not on file  Transportation Needs: Not on file  Physical Activity: Not on file  Stress: Not on file  Social Connections: Not on file  Intimate Partner Violence: Not on file    Outpatient Medications Prior to Visit  Medication Sig Dispense Refill  amLODipine (NORVASC) 10 MG tablet TAKE 1 TABLET BY MOUTH EVERY DAY 90 tablet 1   influenza vac split quadrivalent PF (FLUARIX) 0.5 ML injection Inject into the muscle. 0.5 mL 0   loratadine (CLARITIN) 10 MG tablet Take 10 mg by mouth daily.     omeprazole (PRILOSEC) 20 MG capsule TAKE 1 CAPSULE BY MOUTH EVERY DAY 90 capsule 1   Vibegron (GEMTESA) 75 MG TABS Take by mouth.     metoprolol succinate (TOPROL-XL) 50 MG 24 hr tablet TAKE 1 TABLET BY MOUTH DAILY. TAKE WITH OR IMMEDIATELY FOLLOWING A MEAL. 90 tablet 0   No facility-administered medications prior to visit.    No Known Allergies  Review of Systems  Constitutional:  Negative for fever.  HENT:  Negative for congestion.   Eyes:  Negative for blurred vision.  Respiratory:  Negative for cough.   Cardiovascular:  Negative for chest pain.  Gastrointestinal:  Negative for constipation and diarrhea.  Genitourinary:  Positive for frequency (following with urology for OAB). Negative for dysuria.  Musculoskeletal:  Positive  for joint pain (occasional left elbow pain). Negative for myalgias.  Skin:  Negative for rash.  Neurological:  Negative for headaches.  Psychiatric/Behavioral:         Denies depression/anxiety      Objective:    Physical Exam  BP (!) 148/84 (BP Location: Right Arm, Patient Position: Sitting, Cuff Size: Large)   Pulse 83   Temp 98.2 F (36.8 C) (Oral)   Resp 16   Ht 6\' 1"  (1.854 m)   Wt 233 lb (105.7 kg)   SpO2 100%   BMI 30.74 kg/m  Wt Readings from Last 3 Encounters:  11/14/21 233 lb (105.7 kg)  08/19/21 229 lb (103.9 kg)  05/16/21 232 lb (105.2 kg)   Physical Exam  Constitutional: He is oriented to person, place, and time. He appears well-developed and well-nourished. No distress.  HENT:  Head: Normocephalic and atraumatic.  Right Ear: Tympanic membrane and ear canal normal.  Left Ear: Tympanic membrane and ear canal normal.  Mouth/Throat: not examined- pt wearing mask Eyes: Pupils are equal, round, and reactive to light. No scleral icterus.  Neck: Normal range of motion. No thyromegaly present.  Cardiovascular: Normal rate and regular rhythm.   No murmur heard. Pulmonary/Chest: Effort normal and breath sounds normal. No respiratory distress. He has no wheezes. He has no rales. He exhibits no tenderness.  Abdominal: Soft. Bowel sounds are normal. He exhibits no distension and no mass. There is no tenderness. There is no rebound and no guarding.  Musculoskeletal: He exhibits no edema.  Lymphadenopathy:    He has no cervical adenopathy.  Neurological: He is alert and oriented to person, place, and time. He has normal patellar reflexes. He exhibits normal muscle tone. Coordination normal.  Skin: Skin is warm and dry.  Psychiatric: He has a normal mood and affect. His behavior is normal. Judgment and thought content normal.           Assessment & Plan:       Assessment & Plan:   Problem List Items Addressed This Visit       Unprioritized   Routine general  medical examination at a health care facility - Primary    Discussed healthy diet, exercise and weight loss. PSA is being monitored by Urology.  Shingrix #1 today.  Colo up to date, though he plans to follow up with his colorectal surgeon after the first of the year.  HTN (hypertension)    BP Readings from Last 3 Encounters:  11/14/21 (!) 148/84  08/19/21 124/65  05/16/21 (!) 142/85  Not checking bp at home.  Will continue amlodipine 10mg  and increase toprol xl 50mg  to 75mg . He will send me his follow up readings in 1 week.      Relevant Medications   metoprolol succinate (TOPROL-XL) 50 MG 24 hr tablet    I have discontinued Lorene L. Yeldell "Lamont"'s metoprolol succinate. I am also having him start on metoprolol succinate. Additionally, I am having him maintain his loratadine, amLODipine, Gemtesa, influenza vac split quadrivalent PF, and omeprazole.  Meds ordered this encounter  Medications   metoprolol succinate (TOPROL-XL) 50 MG 24 hr tablet    Sig: Take 1.5 tablets (75 mg total) by mouth daily. Take with or immediately following a meal.    Dispense:  135 tablet    Refill:  1    Order Specific Question:   Supervising Provider    Answer:   Penni Homans A [[id number]]

## 2021-11-14 NOTE — Assessment & Plan Note (Signed)
>>  ASSESSMENT AND PLAN FOR ROUTINE GENERAL MEDICAL EXAMINATION AT A HEALTH CARE FACILITY WRITTEN ON 11/14/2021 10:02 AM BY O'SULLIVAN, Jennfier Abdulla, NP  Discussed healthy diet, exercise and weight loss. PSA is being monitored by Urology.  Shingrix #1 today.  Colo up to date, though he plans to follow up with his colorectal surgeon after the first of the year.

## 2022-01-07 ENCOUNTER — Encounter: Payer: Self-pay | Admitting: Family

## 2022-02-07 ENCOUNTER — Other Ambulatory Visit: Payer: Self-pay | Admitting: Family

## 2022-02-20 ENCOUNTER — Ambulatory Visit: Payer: BC Managed Care – PPO | Admitting: Family

## 2022-02-20 VITALS — BP 140/81 | HR 69 | Temp 98.2°F | Resp 16 | Wt 238.0 lb

## 2022-02-20 DIAGNOSIS — R972 Elevated prostate specific antigen [PSA]: Secondary | ICD-10-CM

## 2022-02-20 DIAGNOSIS — J301 Allergic rhinitis due to pollen: Secondary | ICD-10-CM

## 2022-02-20 DIAGNOSIS — K219 Gastro-esophageal reflux disease without esophagitis: Secondary | ICD-10-CM | POA: Diagnosis not present

## 2022-02-20 DIAGNOSIS — Z23 Encounter for immunization: Secondary | ICD-10-CM

## 2022-02-20 DIAGNOSIS — I10 Essential (primary) hypertension: Secondary | ICD-10-CM

## 2022-02-20 MED ORDER — METOPROLOL SUCCINATE ER 100 MG PO TB24: 100.0000 mg | ORAL_TABLET | Freq: Every day | ORAL | 1 refills | Status: DC

## 2022-02-20 MED ORDER — AMLODIPINE BESYLATE 10 MG PO TABS: 10.0000 mg | ORAL_TABLET | Freq: Every day | ORAL | 1 refills | Status: DC

## 2022-02-20 NOTE — Assessment & Plan Note (Signed)
Reports that his PSA was the lowest it has been since 2017 at his most recent urology appointment.

## 2022-02-20 NOTE — Assessment & Plan Note (Signed)
Stable on omeprazole 40mg once daily. Continue same.  

## 2022-02-20 NOTE — Assessment & Plan Note (Signed)
Stable on claritin.

## 2022-02-20 NOTE — Assessment & Plan Note (Signed)
BP improving. I would like to see his SBP <140. Will increase toprol xl from 75mg  to 100mg . Continue amlodipine 10mg  once daily.

## 2022-02-20 NOTE — Progress Notes (Signed)
Subjective:     Patient ID: Darin Smith, male    DOB: [birth year], 51 y.o.   MRN: [id number]  Chief Complaint  Patient presents with   Hypertension    Here for follow up    Hypertension  Patient is in today for follow up.  HTN- medications include amlodipine 10mg , metoprolol 75 mg (Toprol xl). He has not been checking his blood pressure at home.   BP Readings from Last 3 Encounters:  02/20/22 140/81  11/14/21 (!) 148/84  08/19/21 124/65   GERD- maintained on omeprazole 20mg . Reports symptoms are stable.   Allergic rhinitis- stable on claritin.   There are no preventive care reminders to display for this patient.   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 Lowella Bandy- negative   COLONOSCOPY  09/12/2019   LAPAROSCOPIC PARTIAL COLECTOMY Right 10/11/2019   Procedure: LAPAROSCOPIC RIGHT HEMICOLECTOMY;  Surgeon: Ileana Roup, MD;  Location: [facility];  Service: General;  Laterality: Right;   SPLENECTOMY      Family History  Problem Relation Age of Onset   Lupus Mother    Vision loss Mother        in left eye   Hypertension Father    COPD Father    Obesity Sister    Thyroid disease Sister    Multiple sclerosis Sister    Cancer Neg Hx        prostate   Colon cancer Neg Hx    Colon polyps Neg Hx    Esophageal cancer Neg Hx    Rectal cancer Neg Hx    Stomach cancer Neg Hx     Social History   Socioeconomic History   Marital status: Married    Spouse name: Not on file   Number of children: Not on file   Years of education: Not on file   Highest education level: Not on file   Occupational History   Not on file  Tobacco Use   Smoking status: Some Days    Types: Cigars   Smokeless tobacco: Never   Tobacco comments:    Occasional cigars  Vaping Use   Vaping Use: Never used  Substance and Sexual Activity   Alcohol use: Not Currently    Alcohol/week: 1.0 standard drink    Types: 1 Shots of liquor per week    Comment: daily   Drug use: No   Sexual activity: Yes    Partners: Female  Other Topics Concern   Not on file  Social History Narrative   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 [place]   Social Determinants of Health   Financial Resource Strain: Not on file  Food Insecurity: Not on file  Transportation Needs: Not on file  Physical Activity: Not on file  Stress: Not on file  Social Connections: Not on file  Intimate Partner Violence: Not on file    Outpatient Medications Prior to Visit  Medication Sig Dispense Refill   influenza vac split quadrivalent PF (FLUARIX) 0.5 ML injection Inject into the muscle. 0.5 mL 0   loratadine (CLARITIN) 10 MG tablet Take 10 mg by mouth daily.     omeprazole (PRILOSEC) 20 MG capsule TAKE 1 CAPSULE BY MOUTH EVERY DAY 90 capsule 1   Vibegron (GEMTESA) 75 MG TABS Take by mouth.     amLODipine (NORVASC) 10 MG tablet TAKE 1 TABLET BY MOUTH EVERY DAY 90 tablet 1   metoprolol succinate (TOPROL-XL) 50 MG 24 hr tablet TAKE 1 TABLET BY MOUTH EVERY DAY WITH OR IMMEDIATELY FOLLOWING A MEAL 90 tablet 1   No facility-administered medications prior to visit.    No Known Allergies  ROS See HPI    Objective:    Physical Exam Constitutional:      General: He is not in acute distress.    Appearance: He is well-developed.  HENT:     Head: Normocephalic and atraumatic.  Cardiovascular:     Rate and Rhythm: Normal rate and regular rhythm.     Heart sounds: No murmur heard. Pulmonary:     Effort: Pulmonary effort is  normal. No respiratory distress.     Breath sounds: Normal breath sounds. No wheezing or rales.  Skin:    General: Skin is warm and dry.  Neurological:     Mental Status: He is alert and oriented to person, place, and time.  Psychiatric:        Behavior: Behavior normal.        Thought Content: Thought content normal.    BP 140/81 (BP Location: Right Arm, Patient Position: Sitting, Cuff Size: Large)    Pulse 69    Temp 98.2 F (36.8 C) (Oral)    Resp 16    Wt 238 lb (108 kg)    SpO2 100%    BMI 31.40 kg/m  Wt Readings from Last 3 Encounters:  02/20/22 238 lb (108 kg)  11/14/21 233 lb (105.7 kg)  08/19/21 229 lb (103.9 kg)       Assessment & Plan:   Problem List Items Addressed This Visit       Unprioritized   HTN (hypertension)    BP improving. I would like to see his SBP <140. Will increase toprol xl from 75mg  to 100mg . Continue amlodipine 10mg  once daily.       Relevant Medications   metoprolol succinate (TOPROL-XL) 100 MG 24 hr tablet   amLODipine (NORVASC) 10 MG tablet   GERD (gastroesophageal reflux disease)    Stable on omeprazole 40mg  once daily. Continue same.       Elevated PSA    Reports that his PSA was the lowest it has been since 2017 at his most recent urology appointment.       Allergic rhinitis    Stable on claritin.       Other Visit Diagnoses     Need for shingles vaccine    -  Primary   Relevant Orders   Varicella-zoster vaccine IM (Shingrix) (Completed)       I have discontinued Koa L. Loomis "Lamont"'s metoprolol succinate. I have also changed his amLODipine. Additionally, I am having him start on metoprolol succinate. Lastly, I am having him maintain his loratadine, Gemtesa, influenza vac split quadrivalent PF, and omeprazole.  Meds ordered this encounter  Medications   metoprolol succinate (TOPROL-XL) 100 MG 24 hr tablet    Sig: Take 1 tablet (100 mg total) by mouth daily. Take with or immediately  following a meal.    Dispense:   90 tablet    Refill:  1    Order Specific Question:   Supervising Provider    Answer:   Penni Homans A [[id number]]   amLODipine (NORVASC) 10 MG tablet    Sig: Take 1 tablet (10 mg total) by mouth daily.    Dispense:  90 tablet    Refill:  1    Order Specific Question:   Supervising Provider    Answer:   Penni Homans A [[id number]]

## 2022-03-20 ENCOUNTER — Ambulatory Visit: Payer: BC Managed Care – PPO | Admitting: Family

## 2022-03-20 DIAGNOSIS — I10 Essential (primary) hypertension: Secondary | ICD-10-CM

## 2022-03-20 NOTE — Patient Instructions (Signed)
Please continue current medications/doses.  ?

## 2022-03-20 NOTE — Progress Notes (Signed)
? ?Subjective:  ? ?By signing my name below, I, Darin Smith, attest that this documentation has been prepared under the direction and in the presence of Darin Alar NP, 03/20/2022   ? ? Patient ID: Darin Smith, male    DOB: January 30, 1971, 51 y.o.   MRN: [id number] ? ?Chief Complaint  ?Patient presents with  ? Hypertension  ?  Here for follow up after medication dose increased  ? ? ?HPI ?Patient is in today for an office visit.  ? ?Blood Pressure - His blood pressure is improving. He is currently taking 1 tablet of 100 MG of Metoprolol Succinate and 1 tablet of 10 MG of Amlodipine.  ?BP Readings from Last 3 Encounters:  ?03/20/22 121/77  ?02/20/22 140/81  ?11/14/21 (!) 148/84  ?  ?There are no preventive care reminders to display for this patient. ? ?Past Medical History:  ?Diagnosis Date  ? Allergy   ? 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 Diverticulitis   ? GERD (gastroesophageal reflux disease)   ? History of colon polyps   ? Hypertension   ? OAB (overactive bladder)   ? Rupture spleen age 69  ? hx of abdominal trauma  ? Seasonal allergies   ? ? ?Past Surgical History:  ?Procedure Laterality Date  ? BIOPSY PROSTATE  5/13  ? Dr. Lowella Smith- negative  ? COLONOSCOPY  09/12/2019  ? LAPAROSCOPIC PARTIAL COLECTOMY Right 10/11/2019  ? Procedure: LAPAROSCOPIC RIGHT HEMICOLECTOMY;  Surgeon: Darin Roup, MD;  Location: [facility];  Service: General;  Laterality: Right;  ? SPLENECTOMY    ? ? ?Family History  ?Problem Relation Age of Onset  ? Lupus Mother   ? Vision loss Mother   ?     in left eye  ? Hypertension Father   ? COPD Father   ? Obesity Sister   ? Thyroid disease Sister   ? Multiple sclerosis Sister   ? Cancer Neg Hx   ?     prostate  ? Colon cancer Neg Hx   ? Colon polyps Neg Hx   ? Esophageal cancer Neg Hx   ? Rectal cancer Neg Hx   ? Stomach cancer Neg Hx   ? ? ?Social History  ? ?Socioeconomic History  ? Marital status: Married  ?  Spouse name: Not on file  ?  Number of children: Not on file  ? Years of education: Not on file  ? Highest education level: Not on file  ?Occupational History  ? Not on file  ?Tobacco Use  ? Smoking status: Some Days  ?  Types: Cigars  ? Smokeless tobacco: Never  ? Tobacco comments:  ?  Occasional cigars  ?Vaping Use  ? Vaping Use: Never used  ?Substance and Sexual Activity  ? Alcohol use: Not Currently  ?  Alcohol/week: 1.0 standard drink  ?  Types: 1 Shots of liquor per week  ?  Comment: daily  ? Drug use: No  ? Sexual activity: Yes  ?  Partners: Female  ?Other Topics Concern  ? Not on file  ?Social History Narrative  ? Regular exercise:    ? [employer] driverCaffeine   ? Use: 1 soda/teaDivorced, son shares time between pt and [place] ex [place]. [place]  ? ?Social Determinants of Health  ? ?Financial Resource Strain: Not on file  ?Food Insecurity: Not on file  ?Transportation Needs: Not on file  ?Physical Activity: Not on file  ?Stress: Not on  file  ?Social Connections: Not on file  ?Intimate Partner Violence: Not on file  ? ? ?Outpatient Medications Prior to Visit  ?Medication Sig Dispense Refill  ? amLODipine (NORVASC) 10 MG tablet Take 1 tablet (10 mg total) by mouth daily. 90 tablet 1  ? influenza vac split quadrivalent PF (FLUARIX) 0.5 ML injection Inject into the muscle. 0.5 mL 0  ? loratadine (CLARITIN) 10 MG tablet Take 10 mg by mouth daily.    ? metoprolol succinate (TOPROL-XL) 100 MG 24 hr tablet Take 1 tablet (100 mg total) by mouth daily. Take with or immediately following a meal. 90 tablet 1  ? omeprazole (PRILOSEC) 20 MG capsule TAKE 1 CAPSULE BY MOUTH EVERY DAY 90 capsule 1  ? Vibegron (GEMTESA) 75 MG TABS Take by mouth.    ? ?No facility-administered medications prior to visit.  ? ? ?No Known Allergies ? ?ROS ?See HPI ?   ?Objective:  ?  ?Physical Exam ?Constitutional:   ?   General: He is not in acute distress. ?   Appearance: Normal appearance. He is not ill-appearing.  ?HENT:  ?   Head: Normocephalic and  atraumatic.  ?   Right Ear: External ear normal.  ?   Left Ear: External ear normal.  ?Eyes:  ?   Extraocular Movements: Extraocular movements intact.  ?   Pupils: Pupils are equal, round, and reactive to light.  ?Cardiovascular:  ?   Rate and Rhythm: Normal rate and regular rhythm.  ?   Heart sounds: Normal heart sounds. No murmur heard. ?  No gallop.  ?Pulmonary:  ?   Effort: Pulmonary effort is normal. No respiratory distress.  ?   Breath sounds: Normal breath sounds. No wheezing or rales.  ?Skin: ?   General: Skin is warm and dry.  ?Neurological:  ?   Mental Status: He is alert and oriented to person, place, and time.  ?Psychiatric:     ?   Mood and Affect: Mood normal.     ?   Behavior: Behavior normal.     ?   Judgment: Judgment normal.  ? ? ?BP 121/77 (BP Location: Right Arm, Patient Position: Sitting, Cuff Size: Large)   Pulse 69   Temp 98.4 ?F (36.9 ?C) (Oral)   Resp 16   Wt 234 lb (106.1 kg)   SpO2 98%   BMI 30.87 kg/m?  ?Wt Readings from Last 3 Encounters:  ?03/20/22 234 lb (106.1 kg)  ?02/20/22 238 lb (108 kg)  ?11/14/21 233 lb (105.7 kg)  ? ? ?   ?Assessment & Plan:  ? ?Problem List Items Addressed This Visit   ? ?  ? Unprioritized  ? HTN (hypertension)  ?  BP improved and at goal. Continue amlodipine '10mg'$  and toprol xl '100mg'$ .  ?  ?  ? ? ? ? ?No orders of the defined types were placed in this encounter. ? ? ?I, Darin Pear, NP, personally preformed the services described in this documentation.  All medical record entries made by the scribe were at my direction and in my presence.  I have reviewed the chart and discharge instructions (if applicable) and agree that the record reflects my personal performance and is accurate and complete. 03/20/2022 ? ? ?I,Darin Smith,acting as a Education administrator for [employer], NP.,have documented all relevant documentation on the behalf of Darin Pear, NP,as directed by  Darin Pear, NP while in the presence of Darin Pear,  NP. ? ? ? ?Darin Pear,  NP ? ?

## 2022-03-20 NOTE — Assessment & Plan Note (Signed)
BP improved and at goal. Continue amlodipine '10mg'$  and toprol xl '100mg'$ .  ?

## 2022-05-05 ENCOUNTER — Other Ambulatory Visit: Payer: Self-pay | Admitting: Family

## 2022-08-22 ENCOUNTER — Other Ambulatory Visit: Payer: Self-pay | Admitting: Family

## 2022-09-09 ENCOUNTER — Other Ambulatory Visit: Payer: Self-pay | Admitting: Family

## 2022-09-21 ENCOUNTER — Ambulatory Visit: Payer: BC Managed Care – PPO | Admitting: Family

## 2022-09-21 VITALS — BP 138/85 | HR 67 | Temp 98.3°F | Resp 16 | Wt 237.0 lb

## 2022-09-21 DIAGNOSIS — R972 Elevated prostate specific antigen [PSA]: Secondary | ICD-10-CM

## 2022-09-21 DIAGNOSIS — J301 Allergic rhinitis due to pollen: Secondary | ICD-10-CM

## 2022-09-21 DIAGNOSIS — I10 Essential (primary) hypertension: Secondary | ICD-10-CM

## 2022-09-21 DIAGNOSIS — E781 Pure hyperglyceridemia: Secondary | ICD-10-CM | POA: Diagnosis not present

## 2022-09-21 DIAGNOSIS — K219 Gastro-esophageal reflux disease without esophagitis: Secondary | ICD-10-CM | POA: Diagnosis not present

## 2022-09-21 DIAGNOSIS — Z23 Encounter for immunization: Secondary | ICD-10-CM | POA: Diagnosis not present

## 2022-09-21 LAB — COMPREHENSIVE METABOLIC PANEL
ALT: 26 U/L (ref 0–53)
AST: 18 U/L (ref 0–37)
Albumin: 4.3 g/dL (ref 3.5–5.2)
Alkaline Phosphatase: 64 U/L (ref 39–117)
BUN: 13 mg/dL (ref 6–23)
CO2: 29 mEq/L (ref 19–32)
Calcium: 9.6 mg/dL (ref 8.4–10.5)
Chloride: 105 mEq/L (ref 96–112)
Creatinine, Ser: 1.3 mg/dL (ref 0.40–1.50)
GFR: 63.86 mL/min (ref >60.00)
Glucose, Bld: 100 mg/dL — ABNORMAL HIGH (ref 70–99)
Potassium: 4.5 mEq/L (ref 3.5–5.1)
Sodium: 142 mEq/L (ref 135–145)
Total Bilirubin: 0.5 mg/dL (ref 0.2–1.2)
Total Protein: 7.2 g/dL (ref 6.0–8.3)

## 2022-09-21 LAB — LIPID PANEL
Cholesterol: 205 mg/dL — ABNORMAL HIGH (ref 0–200)
HDL: 44 mg/dL (ref >39.00)
LDL Cholesterol: 124 mg/dL — ABNORMAL HIGH (ref 0–99)
NonHDL: 160.5
Total CHOL/HDL Ratio: 5
Triglycerides: 184 mg/dL — ABNORMAL HIGH (ref 0.0–149.0)
VLDL: 36.8 mg/dL (ref 0.0–40.0)

## 2022-09-21 MED ORDER — OMEPRAZOLE 20 MG PO CPDR: DELAYED_RELEASE_CAPSULE | ORAL | 1 refills | Status: DC

## 2022-09-21 NOTE — Assessment & Plan Note (Signed)
BP Readings from Last 3 Encounters:  09/21/22 (!) 143/82  03/20/22 121/77  02/20/22 140/81   Wt Readings from Last 3 Encounters:  09/21/22 237 lb (107.5 kg)  03/20/22 234 lb (106.1 kg)  02/20/22 238 lb (108 kg)   Maintained on metoprolol and amlodipine.

## 2022-09-21 NOTE — Assessment & Plan Note (Signed)
Reports stable on generic claritin.

## 2022-09-21 NOTE — Assessment & Plan Note (Signed)
Reports stable on omeprazole.  If he misses a dose, he has symptoms.

## 2022-09-21 NOTE — Assessment & Plan Note (Signed)
Has follow up scheduled with Urology.

## 2022-09-21 NOTE — Progress Notes (Signed)
Subjective:   By signing my name below, I, [name], attest that this documentation has been prepared under the direction and in the presence of Debbrah Alar NP. 09/21/2022    Patient ID: Darin Smith, male    DOB: 1971/05/24, 51 y.o.   MRN: [id number]  Chief Complaint  Patient presents with   Hypertension    Here for follow up    Hypertension   Patient is in today for a follow up visit.   Hypertension- His blood pressure was slightly elevated during this visit. He reports his blood pressure machine at home is malfunctioning. He continues taking 10 mg amlodipine daily PO, 100 mg metoprolol succinate daily PO and reports no new issues while taking it.  BP Readings from Last 3 Encounters:  09/21/22 138/85  03/20/22 121/77  02/20/22 140/81   Pulse Readings from Last 3 Encounters:  09/21/22 67  03/20/22 69  02/20/22 69   Reflux- He continues taking 20 mg omeprazole daily PO and reports no new issues while taking it.   Supplements- He reports taking beet based supplements for the past couple of weeks and reports no new issues while taking it.   Allergies- He occasionally has flare ups of environmental allergies. He continues taking OTC allergy medications and reports no new issues while taking it.   Urologist He continues following up with his urologist regularly.    Health Maintenance Due  Topic Date Due   INFLUENZA VACCINE  07/28/2022    Past Medical History:  Diagnosis Date   Allergy    Appendiceal tumor 09/2019   Low-grade appendiceal mucinous neoplasm    Diverticulitis    GERD (gastroesophageal reflux disease)    History of colon polyps    Hypertension    OAB (overactive bladder)    Rupture spleen age 65   hx of abdominal trauma   Seasonal allergies     Past Surgical History:  Procedure Laterality Date   BIOPSY PROSTATE  5/13   Dr. Lowella Bandy- negative   COLONOSCOPY  09/12/2019   LAPAROSCOPIC PARTIAL COLECTOMY Right 10/11/2019    Procedure: LAPAROSCOPIC RIGHT HEMICOLECTOMY;  Surgeon: Ileana Roup, MD;  Location: [facility];  Service: General;  Laterality: Right;   SPLENECTOMY      Family History  Problem Relation Age of Onset   Lupus Mother    Vision loss Mother        in left eye   Hypertension Father    COPD Father    Obesity Sister    Thyroid disease Sister    Multiple sclerosis Sister    Cancer Neg Hx        prostate   Colon cancer Neg Hx    Colon polyps Neg Hx    Esophageal cancer Neg Hx    Rectal cancer Neg Hx    Stomach cancer Neg Hx     Social History   Socioeconomic History   Marital status: Married    Spouse name: Not on file   Number of children: Not on file   Years of education: Not on file   Highest education level: Not on file  Occupational History   Not on file  Tobacco Use   Smoking status: Some Days    Types: Cigars   Smokeless tobacco: Never   Tobacco comments:    Occasional cigars  Vaping Use   Vaping Use: Never used  Substance and Sexual Activity   Alcohol use: Not Currently    Alcohol/week: 1.0 standard  drink of alcohol    Types: 1 Shots of liquor per week    Comment: daily   Drug use: No   Sexual activity: Yes    Partners: Female  Other Topics Concern   Not on file  Social History Narrative   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 [place]   Social Determinants of Health   Financial Resource Strain: Not on file  Food Insecurity: Not on file  Transportation Needs: Not on file  Physical Activity: Not on file  Stress: Not on file  Social Connections: Not on file  Intimate Partner Violence: Not on file    Outpatient Medications Prior to Visit  Medication Sig Dispense Refill   amLODipine (NORVASC) 10 MG tablet TAKE 1 TABLET BY MOUTH EVERY DAY 90 tablet 1   influenza vac split quadrivalent PF (FLUARIX) 0.5 ML injection Inject into the muscle. 0.5 mL 0    loratadine (CLARITIN) 10 MG tablet Take 10 mg by mouth daily.     metoprolol succinate (TOPROL-XL) 100 MG 24 hr tablet TAKE 1 TABLET BY MOUTH DAILY. TAKE WITH OR IMMEDIATELY FOLLOWING A MEAL. 90 tablet 1   Vibegron (GEMTESA) 75 MG TABS Take by mouth.     omeprazole (PRILOSEC) 20 MG capsule TAKE 1 CAPSULE BY MOUTH EVERY DAY 90 capsule 1   No facility-administered medications prior to visit.    No Known Allergies  ROS     Objective:    Physical Exam Constitutional:      General: He is not in acute distress.    Appearance: Normal appearance. He is not ill-appearing.  HENT:     Head: Normocephalic and atraumatic.     Right Ear: External ear normal.     Left Ear: External ear normal.  Eyes:     Extraocular Movements: Extraocular movements intact.     Pupils: Pupils are equal, round, and reactive to light.  Cardiovascular:     Rate and Rhythm: Normal rate and regular rhythm.     Heart sounds: Normal heart sounds. No murmur heard.    No gallop.     Comments: Blood pressure measured 138/85 during manual recheck Pulmonary:     Effort: Pulmonary effort is normal. No respiratory distress.     Breath sounds: Normal breath sounds. No wheezing or rales.  Skin:    General: Skin is warm and dry.  Neurological:     Mental Status: He is alert and oriented to person, place, and time.  Psychiatric:        Judgment: Judgment normal.     BP 138/85   Pulse 67   Temp 98.3 F (36.8 C) (Oral)   Resp 16   Wt 237 lb (107.5 kg)   SpO2 98%   BMI 31.27 kg/m  Wt Readings from Last 3 Encounters:  09/21/22 237 lb (107.5 kg)  03/20/22 234 lb (106.1 kg)  02/20/22 238 lb (108 kg)       Assessment & Plan:   Problem List Items Addressed This Visit       Unprioritized   HTN (hypertension)    BP Readings from Last 3 Encounters:  09/21/22 (!) 143/82  03/20/22 121/77  02/20/22 140/81   Wt Readings from Last 3 Encounters:  09/21/22 237 lb (107.5 kg)  03/20/22 234 lb (106.1 kg)  02/20/22  238 lb (108 kg)  Maintained on metoprolol and amlodipine.  Relevant Orders   Comp Met (CMET)   GERD (gastroesophageal reflux disease)    Reports stable on omeprazole.  If he misses a dose, he has symptoms.        Relevant Medications   omeprazole (PRILOSEC) 20 MG capsule   Elevated PSA    Has follow up scheduled with Urology.       Allergic rhinitis    Reports stable on generic claritin.       Other Visit Diagnoses     Needs flu shot    -  Primary   Relevant Orders   Flu Vaccine QUAD 6+ mos PF IM (Fluarix Quad PF)   Hypertriglyceridemia       Relevant Orders   Lipid panel        Meds ordered this encounter  Medications   omeprazole (PRILOSEC) 20 MG capsule    Sig: TAKE 1 CAPSULE BY MOUTH EVERY DAY    Dispense:  90 capsule    Refill:  1    Order Specific Question:   Supervising Provider    Answer:   Penni Homans A [[id number]]    I, [name], NP, personally preformed the services described in this documentation.  All medical record entries made by the scribe were at my direction and in my presence.  I have reviewed the chart and discharge instructions (if applicable) and agree that the record reflects my personal performance and is accurate and complete. 09/21/2022   I,[name],acting as a scribe for [name], NP.,have documented all relevant documentation on the behalf of [name], NP,as directed by  [name], NP while in the presence of [name], NP.   [name], NP

## 2022-11-14 IMAGING — MR MR PROSTATE WO/W CM
12 series · 48 of 48 positions shown · IV contrast (20 ML MULTIHANCE)
Comparison: Prostate MRI 04/24/2015

CLINICAL DATA: 49-year-old male with elevated PSA equal 4.9.
Negative biopsy 09/15/2013.

EXAM:
MR PROSTATE WITHOUT AND WITH CONTRAST
TECHNIQUE: Multiplanar multisequence MRI images were obtained of the pelvis
centered about the prostate. Pre and post contrast images were
obtained.
CONTRAST:  20mL MULTIHANCE GADOBENATE DIMEGLUMINE 529 MG/ML IV SOLN

[Series 3: T2 · coronal · 3.0mm · 0.56mm/px · 1 of 22 slices shown (1 of 3)]
[im 1/22]
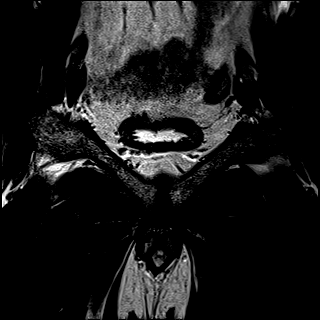

[Series 4: T1 · axial · 5.0mm · 1.25mm/px · 1 of 80 slices shown]
[im 1/80]
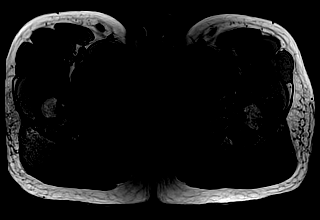

[Series 5: DWI · axial · 3.0mm · 1.75mm/px · 1 of 75 slices shown (1 of 3)]
[im 1/75]
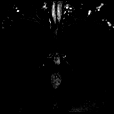

[Series 6: DWI · axial · 3.0mm · 1.75mm/px · 1 of 25 slices shown (2 of 3)]
[im 1/25]
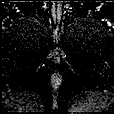

[Series 7: DWI · axial · 3.0mm · 1.75mm/px · 1 of 25 slices shown (3 of 3)]
[im 1/25]
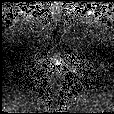

[Series 8: T2 · axial · 3.0mm · 0.56mm/px · 1 of 25 slices shown (2 of 3)]
[im 1/25]
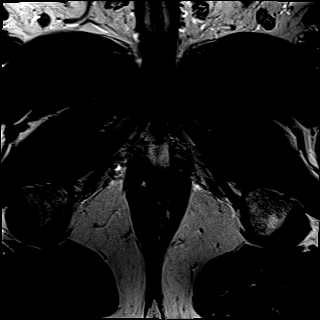

[Series 9: T2 · axial · 1.0mm · 1.04mm/px · z∈[-40,+39]mm · 2 of 80 slices shown (3 of 3)]
[im 1/80]
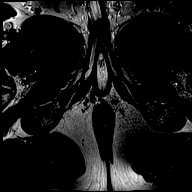
[im 80/80]
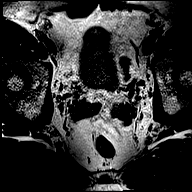

[Series 10: pre t1_twist_tra_dyn · axial · non-contrast · 3.5mm · 0.89mm/px · 1 of 24 slices shown]
[im 1/24]
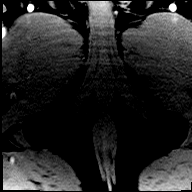

[Series 11: post t1_twist_tra_dyn-copy center · axial · non-contrast · 3.5mm · 0.83mm/px · z∈[-41,+39]mm · 18 of 720 slices shown]
[im 1/720]
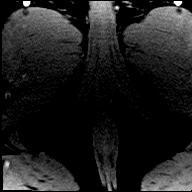
[im 43/720]
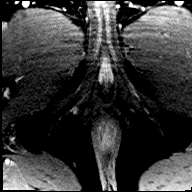
[im 85/720]
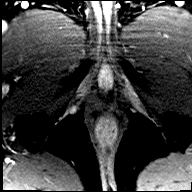
[im 127/720]
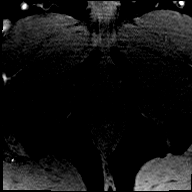
[im 170/720]
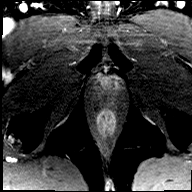
[im 212/720]
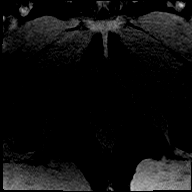
[im 254/720]
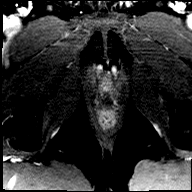
[im 297/720]
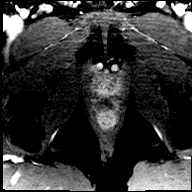
[im 339/720]
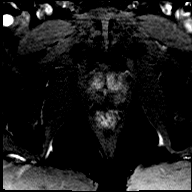
[im 381/720]
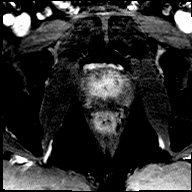
[im 423/720]
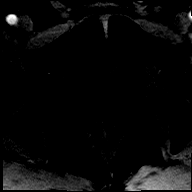
[im 466/720]
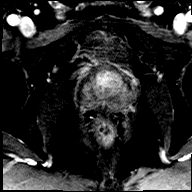
[im 508/720]
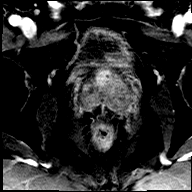
[im 550/720]
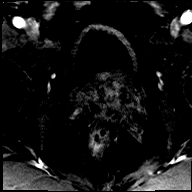
[im 593/720]
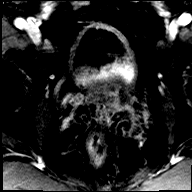
[im 635/720]
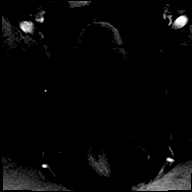
[im 677/720]
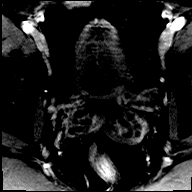
[im 720/720]
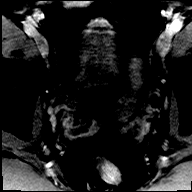

[Series 12: post t1_twist_tra_dyn-copy cent_sub · axial · 3.5mm · 0.83mm/px · z∈[-41,+39]mm · 17 of 696 slices shown]
[im 1/696]
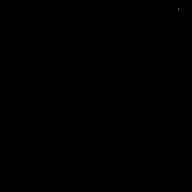
[im 44/696]
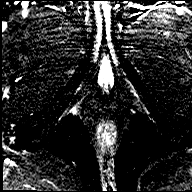
[im 87/696]
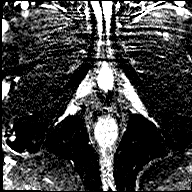
[im 131/696]
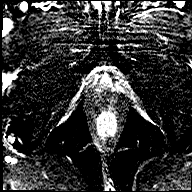
[im 174/696]
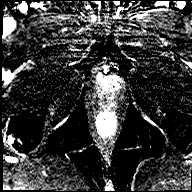
[im 218/696]
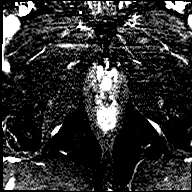
[im 261/696]
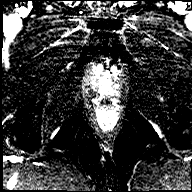
[im 305/696]
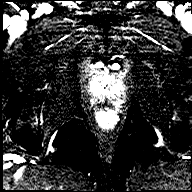
[im 348/696]
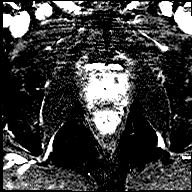
[im 391/696]
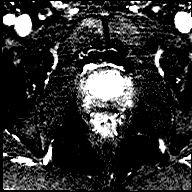
[im 435/696]
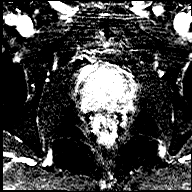
[im 478/696]
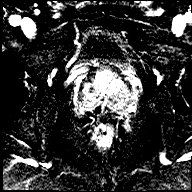
[im 522/696]
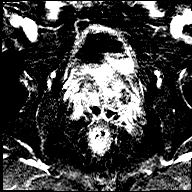
[im 565/696]
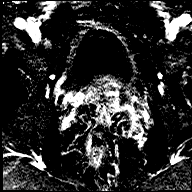
[im 609/696]
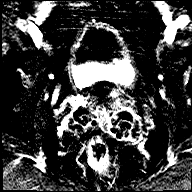
[im 652/696]
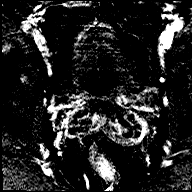
[im 696/696]
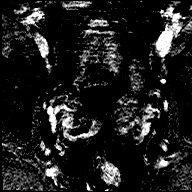

[Series 13: t1_vibe_dixon_tra_f · axial · 2.5mm · 0.91mm/px · z∈[-57,+141]mm · 2 of 80 slices shown]
[im 1/80]
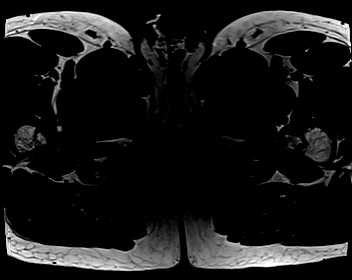
[im 80/80]
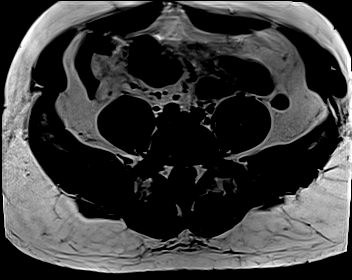

[Series 14: t1_vibe_dixon_tra_w · axial · 2.5mm · 0.91mm/px · z∈[-57,+141]mm · 2 of 80 slices shown]
[im 1/80]
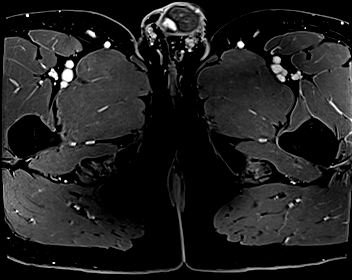
[im 80/80]
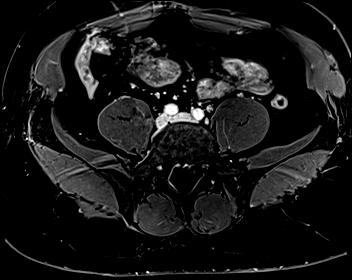

[48 of 48 positions shown; findings below may reference images not displayed]

FINDINGS: Prostate: Normal high signal intensity within the peripheral zone on
T2 weighted imaging (series 8). No foci of restricted diffusion
within the peripheral zone (series 5 and series 6).

Minimal enlargement of the transitional zone by well capsulated
nodules. One partially exophytic nodule extends from the LEFT base
at the level of the LEFT seminal vesicle (image 7/series 8).

No suspicious enhancement pattern on postcontrast imaging

Volume: 5.0 x 4.7 x 3.4 cm (volume = 42 cm^3)

Transcapsular spread:  Absent

Seminal vesicle involvement: Absent

Neurovascular bundle involvement: Absent

Pelvic adenopathy: Absent

Bone metastasis: Absent

Other findings: None
IMPRESSION: 1. No high-grade carcinoma within the peripheral zone.  PI-RADS: 1
2. Nodular transitional zone most consistent benign prostate
hypertrophy. PI-RADS: 2
3. One prominent transitional zone nodule partially exophytic from
the LEFT base.
4. Seminal vesicles normal.  Prostatic capsule intact.

## 2022-11-16 ENCOUNTER — Encounter: Payer: Self-pay | Admitting: Family

## 2022-11-16 ENCOUNTER — Ambulatory Visit (INDEPENDENT_AMBULATORY_CARE_PROVIDER_SITE_OTHER): Payer: BC Managed Care – PPO | Admitting: Family

## 2022-11-16 VITALS — BP 137/84 | HR 60 | Temp 98.1°F | Resp 16 | Ht 73.0 in | Wt 239.0 lb

## 2022-11-16 DIAGNOSIS — E785 Hyperlipidemia, unspecified: Secondary | ICD-10-CM

## 2022-11-16 DIAGNOSIS — K219 Gastro-esophageal reflux disease without esophagitis: Secondary | ICD-10-CM

## 2022-11-16 DIAGNOSIS — I10 Essential (primary) hypertension: Secondary | ICD-10-CM | POA: Diagnosis not present

## 2022-11-16 DIAGNOSIS — Z0000 Encounter for general adult medical examination without abnormal findings: Secondary | ICD-10-CM | POA: Diagnosis not present

## 2022-11-16 DIAGNOSIS — N3281 Overactive bladder: Secondary | ICD-10-CM | POA: Diagnosis not present

## 2022-11-16 DIAGNOSIS — R972 Elevated prostate specific antigen [PSA]: Secondary | ICD-10-CM

## 2022-11-16 LAB — CBC WITH DIFFERENTIAL/PLATELET
Basophils Absolute: 0 10*3/uL (ref 0.0–0.1)
Basophils Relative: 0.4 % (ref 0.0–3.0)
Eosinophils Absolute: 0.1 10*3/uL (ref 0.0–0.7)
Eosinophils Relative: 1.2 % (ref 0.0–5.0)
HCT: 44.8 % (ref 39.0–52.0)
Hemoglobin: 15.1 g/dL (ref 13.0–17.0)
Lymphocytes Relative: 26.1 % (ref 12.0–46.0)
Lymphs Abs: 1.9 10*3/uL (ref 0.7–4.0)
MCHC: 33.8 g/dL (ref 30.0–36.0)
MCV: 91.8 fl (ref 78.0–100.0)
Monocytes Absolute: 0.8 10*3/uL (ref 0.1–1.0)
Monocytes Relative: 10.3 % (ref 3.0–12.0)
Neutro Abs: 4.5 10*3/uL (ref 1.4–7.7)
Neutrophils Relative %: 62 % (ref 43.0–77.0)
Platelets: 332 10*3/uL (ref 150.0–400.0)
RBC: 4.88 Mil/uL (ref 4.22–5.81)
RDW: 13.8 % (ref 11.5–15.5)
WBC: 7.3 10*3/uL (ref 4.0–10.5)

## 2022-11-16 LAB — COMPREHENSIVE METABOLIC PANEL
ALT: 24 U/L (ref 0–53)
AST: 22 U/L (ref 0–37)
Albumin: 4.6 g/dL (ref 3.5–5.2)
Alkaline Phosphatase: 65 U/L (ref 39–117)
BUN: 13 mg/dL (ref 6–23)
CO2: 31 mEq/L (ref 19–32)
Calcium: 9.6 mg/dL (ref 8.4–10.5)
Chloride: 103 mEq/L (ref 96–112)
Creatinine, Ser: 1.35 mg/dL (ref 0.40–1.50)
GFR: 60.96 mL/min (ref >60.00)
Glucose, Bld: 96 mg/dL (ref 70–99)
Potassium: 4.4 mEq/L (ref 3.5–5.1)
Sodium: 141 mEq/L (ref 135–145)
Total Bilirubin: 0.7 mg/dL (ref 0.2–1.2)
Total Protein: 7.3 g/dL (ref 6.0–8.3)

## 2022-11-16 LAB — LIPID PANEL
Cholesterol: 199 mg/dL (ref 0–200)
HDL: 45.6 mg/dL (ref >39.00)
LDL Cholesterol: 120 mg/dL — ABNORMAL HIGH (ref 0–99)
NonHDL: 152.98
Total CHOL/HDL Ratio: 4
Triglycerides: 166 mg/dL — ABNORMAL HIGH (ref 0.0–149.0)
VLDL: 33.2 mg/dL (ref 0.0–40.0)

## 2022-11-16 LAB — TSH: TSH: 1.44 u[IU]/mL (ref 0.35–5.50)

## 2022-11-16 NOTE — Assessment & Plan Note (Signed)
On omeprazole- stable if he takes it regularly.

## 2022-11-16 NOTE — Progress Notes (Signed)
Subjective:   By signing my name below, I, Luna Glasgow, attest that this documentation has been prepared under the direction and in the presence of Debbrah Alar, 11/16/2022.   Patient ID: Darin Smith, male    DOB: July 08, 1971, 51 y.o.   MRN: [id number]  Chief Complaint  Patient presents with   Annual Exam    HPI Patient is in today for a comprehensive physical exam.  He denies new moles, itching, chills, fever, hearing loss, sinus pain, congestion, sore throat, cough and hemoptysis, chest pain, palpitations, wheezing, constipation, diarrhea, blood in stool, nausea and vomiting, dysuria, frequency, hematuria, myalgias and joint pain, depression, anxiety.   Acid Reflux Patient reports that he is doing well on Prilosec 20 mg. He states that if he misses a dose, the reflux comes back.  Overactive bladder Patient reports that he is doing well on 75 mg Gemtesa. He regularly sees his urinologist specialist. Patient reports that he has an upcoming appointment in a week.  Social history- There is no new surgical procedure to report. Colonoscopy - last completed on 09/12/2019. PSA - last completed 12/31/2020.  Exercise- He does not participate in regular exercise. Diet- He does not maintain a well balanced diet, but is trying to make better healthy food choices. Vision - He is not UTD on vision care, but has an upcoming appointment on December 07, 2022. Dental - He is UTD on dental care.   There are no preventive care reminders to display for this patient.  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 Lowella Bandy- negative   COLONOSCOPY  09/12/2019   LAPAROSCOPIC PARTIAL COLECTOMY Right 10/11/2019   Procedure: LAPAROSCOPIC RIGHT HEMICOLECTOMY;  Surgeon: Ileana Roup, MD;  Location: [facility];  Service: General;  Laterality: Right;   SPLENECTOMY      Family History  Problem Relation Age of Onset   Lupus Mother    Vision loss Mother        in left eye   Hypertension Father    COPD Father    Obesity Sister    Thyroid disease Sister    Multiple sclerosis Sister    Cancer Neg Hx        prostate   Colon cancer Neg Hx    Colon polyps Neg Hx    Esophageal cancer Neg Hx    Rectal cancer Neg Hx    Stomach cancer Neg Hx     Social History   Socioeconomic History   Marital status: Married    Spouse name: Not on file   Number of children: Not on file   Years of education: Not on file   Highest education level: Not on file  Occupational History   Not on file  Tobacco Use   Smoking status: Some Days    Types: Cigars   Smokeless tobacco: Never   Tobacco comments:    Occasional cigars  Vaping Use   Vaping Use: Never used  Substance and Sexual Activity  Alcohol use: Not Currently    Alcohol/week: 1.0 standard drink of alcohol    Types: 1 Shots of liquor per week    Comment: daily   Drug use: No   Sexual activity: Yes    Partners: Female  Other Topics Concern   Not on file  Social History Narrative   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 [place]   Social Determinants of Health   Financial Resource Strain: Not on file  Food Insecurity: Not on file  Transportation Needs: Not on file  Physical Activity: Not on file  Stress: Not on file  Social Connections: Not on file  Intimate Partner Violence: Not on file    Outpatient Medications Prior to Visit  Medication Sig Dispense Refill   amLODipine (NORVASC) 10 MG  tablet TAKE 1 TABLET BY MOUTH EVERY DAY 90 tablet 1   influenza vac split quadrivalent PF (FLUARIX) 0.5 ML injection Inject into the muscle. 0.5 mL 0   loratadine (CLARITIN) 10 MG tablet Take 10 mg by mouth daily.     metoprolol succinate (TOPROL-XL) 100 MG 24 hr tablet TAKE 1 TABLET BY MOUTH DAILY. TAKE WITH OR IMMEDIATELY FOLLOWING A MEAL. 90 tablet 1   omeprazole (PRILOSEC) 20 MG capsule TAKE 1 CAPSULE BY MOUTH EVERY DAY 90 capsule 1   Vibegron (GEMTESA) 75 MG TABS Take by mouth.     No facility-administered medications prior to visit.    No Known Allergies  Review of Systems  Constitutional:  Negative for fever and weight loss.   See HPI    Objective:    Physical Exam Constitutional:      General: He is not in acute distress.    Appearance: Normal appearance. He is not ill-appearing.  HENT:     Head: Normocephalic and atraumatic.     Right Ear: Tympanic membrane, ear canal and external ear normal.     Left Ear: Tympanic membrane, ear canal and external ear normal.  Eyes:     Extraocular Movements: Extraocular movements intact.     Pupils: Pupils are equal, round, and reactive to light.  Cardiovascular:     Rate and Rhythm: Normal rate and regular rhythm.     Heart sounds: Normal heart sounds. No murmur heard.    No gallop.  Pulmonary:     Effort: Pulmonary effort is normal. No respiratory distress.     Breath sounds: Normal breath sounds. No wheezing or rales.  Abdominal:     General: Bowel sounds are normal. There is no distension.     Palpations: Abdomen is soft.     Tenderness: There is no abdominal tenderness. There is no guarding.  Musculoskeletal:     Comments: 5/5 upper and lower extremity strength   Skin:    General: Skin is warm and dry.  Neurological:     Mental Status: He is alert and oriented to person, place, and time.     Deep Tendon Reflexes:     Reflex Scores:      Patellar reflexes are 2+ on the right side and 2+ on the left side. Psychiatric:         Mood and Affect: Mood normal.        Behavior: Behavior normal.        Judgment: Judgment normal.     BP 137/84 (BP Location: Right Arm, Patient Position: Sitting, Cuff Size: Large)  Pulse 60   Temp 98.1 F (36.7 C) (Oral)   Resp 16   Ht _0  (1.854 m)   Wt 239 lb (108.4 kg)   SpO2 98%   BMI 31.53 kg/m  Wt Readings from Last 3 Encounters:  11/16/22 239 lb (108.4 kg)  09/21/22 237 lb (107.5 kg)  03/20/22 234 lb (106.1 kg)       Assessment & Plan:   Problem List Items Addressed This Visit       Unprioritized   Preventative health care - Primary    Wt Readings from Last 3 Encounters:  11/16/22 239 lb (108.4 kg)  09/21/22 237 lb (107.5 kg)  03/20/22 234 lb (106.1 kg)  Discussed healthy diet, exercise. Colo up to date. PSA being followed by Urology.  Will get Covid booster at the pharmacy.       Relevant Orders   CBC with Differential/Platelet   TSH   OAB (overactive bladder)    On Gemtesa, stable/improved on QOD dosing.      HTN (hypertension)    BP Readings from Last 3 Encounters:  11/16/22 137/84  09/21/22 138/85  03/20/22 121/77  BP stable. Continue amlodipine, metoprolol      Relevant Orders   Comp Met (CMET)   GERD (gastroesophageal reflux disease)    On omeprazole- stable if he takes it regularly.       Elevated PSA    Management per Urology.       Other Visit Diagnoses     Hyperlipidemia, unspecified hyperlipidemia type       Relevant Orders   Lipid panel      No orders of the defined types were placed in this encounter.   I, Debbrah Alar, personally preformed the services described in this documentation.  All medical record entries made by the scribe were at my direction and in my presence.  I have reviewed the chart and discharge instructions (if applicable) and agree that the record reflects my personal performance and is accurate and complete. 11/16/2022.   I,Verona Buck,acting as a Education administrator for [employer],  NP.,have documented all relevant documentation on the behalf of [name], NP,as directed by  [name], NP while in the presence of [name], NP.      [name], NP

## 2022-11-16 NOTE — Assessment & Plan Note (Signed)
Management per Urology.

## 2022-11-16 NOTE — Assessment & Plan Note (Signed)
Continue healthy diet, exercise, weight loss efforts. Colo and psa up to date.

## 2022-11-16 NOTE — Assessment & Plan Note (Signed)
On Gemtesa, stable/improved on QOD dosing.

## 2022-11-16 NOTE — Assessment & Plan Note (Addendum)
>>  ASSESSMENT AND PLAN FOR PREVENTATIVE HEALTH CARE WRITTEN ON 11/16/2022  8:13 AM BY O'SULLIVAN, Tamyrah Burbage, NP  Wt Readings from Last 3 Encounters:  11/16/22 239 lb (108.4 kg)  09/21/22 237 lb (107.5 kg)  03/20/22 234 lb (106.1 kg)   Discussed healthy diet, exercise. Colo up to date. PSA being followed by Urology.  Will get Covid booster at the pharmacy.    >>ASSESSMENT AND PLAN FOR ROUTINE GENERAL MEDICAL EXAMINATION AT A HEALTH CARE FACILITY WRITTEN ON 11/16/2022  8:25 AM BY O'SULLIVAN, Lindamarie Maclachlan, NP  Continue healthy diet, exercise, weight loss efforts. Colo and psa up to date.

## 2022-11-16 NOTE — Assessment & Plan Note (Signed)
BP Readings from Last 3 Encounters:  11/16/22 137/84  09/21/22 138/85  03/20/22 121/77   BP stable. Continue amlodipine, metoprolol

## 2023-02-17 ENCOUNTER — Other Ambulatory Visit: Payer: Self-pay | Admitting: Family

## 2023-02-22 ENCOUNTER — Other Ambulatory Visit: Payer: Self-pay | Admitting: Urology

## 2023-02-22 DIAGNOSIS — R972 Elevated prostate specific antigen [PSA]: Secondary | ICD-10-CM

## 2023-03-10 ENCOUNTER — Other Ambulatory Visit: Payer: Self-pay | Admitting: Family

## 2023-03-27 ENCOUNTER — Ambulatory Visit
Admission: RE | Admit: 2023-03-27 | Discharge: 2023-03-27 | Disposition: A | Discharge status: Home / Self Care | Payer: BC Managed Care – PPO | Source: Ambulatory Visit | Attending: Urology | Admitting: Urology

## 2023-03-27 DIAGNOSIS — R972 Elevated prostate specific antigen [PSA]: Secondary | ICD-10-CM

## 2023-03-27 MED ORDER — GADOPICLENOL 0.5 MMOL/ML IV SOLN
10.0000 mL | Freq: Once | INTRAVENOUS | Status: AC | PRN
  Administered 2023-03-27: 10 mL via INTRAVENOUS

## 2023-04-12 ENCOUNTER — Encounter: Payer: Self-pay | Admitting: *Deleted

## 2023-05-06 ENCOUNTER — Other Ambulatory Visit: Payer: Self-pay | Admitting: Family

## 2023-05-17 ENCOUNTER — Ambulatory Visit: Payer: BC Managed Care – PPO | Admitting: Family

## 2023-05-17 VITALS — BP 139/82 | HR 79 | Temp 98.0°F | Resp 16 | Wt 241.0 lb

## 2023-05-17 DIAGNOSIS — Z9081 Acquired absence of spleen: Secondary | ICD-10-CM

## 2023-05-17 DIAGNOSIS — N3281 Overactive bladder: Secondary | ICD-10-CM

## 2023-05-17 DIAGNOSIS — I10 Essential (primary) hypertension: Secondary | ICD-10-CM

## 2023-05-17 DIAGNOSIS — L918 Other hypertrophic disorders of the skin: Secondary | ICD-10-CM | POA: Diagnosis not present

## 2023-05-17 DIAGNOSIS — J301 Allergic rhinitis due to pollen: Secondary | ICD-10-CM

## 2023-05-17 DIAGNOSIS — K219 Gastro-esophageal reflux disease without esophagitis: Secondary | ICD-10-CM

## 2023-05-17 LAB — BASIC METABOLIC PANEL
BUN: 11 mg/dL (ref 6–23)
CO2: 29 mEq/L (ref 19–32)
Calcium: 10 mg/dL (ref 8.4–10.5)
Chloride: 102 mEq/L (ref 96–112)
Creatinine, Ser: 1.26 mg/dL (ref 0.40–1.50)
GFR: 66 mL/min (ref >60.00)
Glucose, Bld: 110 mg/dL — ABNORMAL HIGH (ref 70–99)
Potassium: 4.5 mEq/L (ref 3.5–5.1)
Sodium: 142 mEq/L (ref 135–145)

## 2023-05-17 MED ORDER — METOPROLOL SUCCINATE ER 100 MG PO TB24: ORAL_TABLET | ORAL | 1 refills | Status: DC

## 2023-05-17 MED ORDER — CEFDINIR 300 MG PO CAPS
300.0000 mg | ORAL_CAPSULE | Freq: Two times a day (BID) | ORAL | 0 refills | Status: DC
Start: 2023-05-17 — End: 2024-01-05

## 2023-05-17 MED ORDER — AMLODIPINE BESYLATE 10 MG PO TABS: 10.0000 mg | ORAL_TABLET | Freq: Every day | ORAL | 1 refills | Status: DC

## 2023-05-17 NOTE — Assessment & Plan Note (Signed)
Stable with QOD gemtesa. Continue same.

## 2023-05-17 NOTE — Assessment & Plan Note (Signed)
BP Readings from Last 3 Encounters:  05/17/23 139/82  11/16/22 137/84  09/21/22 138/85   Maintained on amlodipine/toprol xl.

## 2023-05-17 NOTE — Progress Notes (Signed)
Subjective:   By signing my name below, I, Darin Smith, attest that this documentation has been prepared under the direction and in the presence of Darin Craze, NP. 05/17/2023   Patient ID: Darin Smith, male    DOB: [birth year], 52 y.o.   MRN: [id number]  Chief Complaint  Patient presents with   Hypertension    Here for follow up    Hypertension   Patient is in today for a follow up visit.   Skin Tags: He is developing more skin tags.   Dermatologist: He is developing more black heads on his scalp and is requesting to see a dermatologist to help manage his symptoms.   Ear drainage: He complains of occasional ear drainage and is requesting to see a ENT specialist for further evaluation.   Urology: He continues following up with his urologist to manage his prostate. He reports his last PSA level continues being elevated. He received an MRI on 03/27/2023 and found an 8 mm nodule. He reports he may need another biopsy.   Allergies: His allergies are flaring up. He is taking OTC allergy mediation to manage his symptoms and finds relief while taking it.   Omeprazole: He continues taking omeprazole and reports finding relief while taking it. His symptoms return quickly if he stops taking it.   Anti-biotics: He reports taking anti-biotics regularly since his spleen removal procedure a couple years ago but reports running out and has not taken it in a while. He is requesting a refill for it.   Overactive bladder: He continues taking 75 mg Gemtesa every other day to manage his overactive bladder. His symptoms have improved gradually so he recently switched to taking Gemtesa daily to every other day.   Immunizations: He is UTD on the flu vaccine. He is interested in receiving the latest Covid-19 vaccine during the fall season.    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 Su Grand- negative   COLONOSCOPY  09/12/2019   LAPAROSCOPIC PARTIAL COLECTOMY Right 10/11/2019   Procedure: LAPAROSCOPIC RIGHT HEMICOLECTOMY;  Surgeon: Andria Meuse, MD;  Location: [facility];  Service: General;  Laterality: Right;   SPLENECTOMY      Family History  Problem Relation Age of Onset   Lupus Mother    Vision loss Mother        in left eye   Hypertension Father    COPD Father    Obesity Sister    Thyroid disease Sister    Multiple sclerosis Sister    Cancer Neg Hx        prostate   Colon cancer Neg Hx    Colon polyps Neg Hx    Esophageal cancer Neg Hx    Rectal cancer Neg Hx    Stomach cancer Neg Hx     Social History   Socioeconomic History   Marital status: Married    Spouse name: Not on file   Number of children: Not on file   Years of  education: Not on file   Highest education level: 12th grade  Occupational History   Not on file  Tobacco Use   Smoking status: Some Days    Types: Cigars   Smokeless tobacco: Never   Tobacco comments:    Occasional cigars  Vaping Use   Vaping Use: Never used  Substance and Sexual Activity   Alcohol use: Not Currently    Alcohol/week: 1.0 standard drink of alcohol    Types: 1 Shots of liquor per week    Comment: daily   Drug use: No   Sexual activity: Yes    Partners: Female  Other Topics Concern   Not on file  Social History Narrative   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 [place]   Social Determinants of Health   Financial Resource Strain: Low Risk  (05/10/2023)   Overall  Financial Resource Strain (CARDIA)    Difficulty of Paying Living Expenses: Not hard at all  Food Insecurity: No Food Insecurity (05/10/2023)   Hunger Vital Sign    Worried About Running Out of Food in the Last Year: Never true    Ran Out of Food in the Last Year: Never true  Transportation Needs: No Transportation Needs (05/10/2023)   PRAPARE - Administrator, Civil Service (Medical): No    Lack of Transportation (Non-Medical): No  Physical Activity: Unknown (05/10/2023)   Exercise Vital Sign    Days of Exercise per Week: 0 days    Minutes of Exercise per Session: Not on file  Stress: No Stress Concern Present (05/10/2023)   [employer] of Occupational Health - Occupational Stress Questionnaire    Feeling of Stress : Not at all  Social Connections: Moderately Integrated (05/10/2023)   Social Connection and Isolation Panel [NHANES]    Frequency of Communication with Friends and Family: More than three times a week    Frequency of Social Gatherings with Friends and Family: Once a week    Attends Religious Services: Never    Database administrator or Organizations: Yes    Attends Engineer, structural: More than 4 times per year    Marital Status: Married  Catering manager Violence: Not on file    Outpatient Medications Prior to Visit  Medication Sig Dispense Refill   loratadine (CLARITIN) 10 MG tablet Take 10 mg by mouth daily.     omeprazole (PRILOSEC) 20 MG capsule TAKE 1 CAPSULE BY MOUTH EVERY DAY 90 capsule 1   Vibegron (GEMTESA) 75 MG TABS Take by mouth.     amLODipine (NORVASC) 10 MG tablet TAKE 1 TABLET BY MOUTH EVERY DAY 90 tablet 1   metoprolol succinate (TOPROL-XL) 100 MG 24 hr tablet TAKE 1 TABLET BY MOUTH EVERY DAY WITH OR IMMEDIATELY FOLLOWING A MEAL 90 tablet 1   influenza vac split quadrivalent PF (FLUARIX) 0.5 ML injection Inject into the muscle. 0.5 mL 0   No facility-administered medications prior to visit.    No Known Allergies  Review  of Systems  HENT:         (+)occasional drainage from ear  Skin:        (+)frequent skin tags       Objective:    Physical Exam Constitutional:      General: He is not in acute distress.    Appearance: Normal appearance. He is not ill-appearing.  HENT:  Head: Normocephalic and atraumatic.     Right Ear: Tympanic membrane, ear canal and external ear normal.     Left Ear: Tympanic membrane, ear canal and external ear normal.  Eyes:     Extraocular Movements: Extraocular movements intact.     Pupils: Pupils are equal, round, and reactive to light.  Cardiovascular:     Rate and Rhythm: Normal rate and regular rhythm.     Heart sounds: Normal heart sounds. No murmur heard.    No gallop.  Pulmonary:     Effort: Pulmonary effort is normal. No respiratory distress.     Breath sounds: Normal breath sounds. No wheezing or rales.  Skin:    General: Skin is warm and dry.  Neurological:     Mental Status: He is alert and oriented to person, place, and time.  Psychiatric:        Judgment: Judgment normal.     BP 139/82 (BP Location: Right Arm, Patient Position: Sitting, Cuff Size: Large)   Pulse 79   Temp 98 F (36.7 C) (Oral)   Resp 16   Wt 241 lb (109.3 kg)   SpO2 100%   BMI 31.80 kg/m  Wt Readings from Last 3 Encounters:  05/17/23 241 lb (109.3 kg)  11/16/22 239 lb (108.4 kg)  09/21/22 237 lb (107.5 kg)       Assessment & Plan:  Skin tag -     Ambulatory referral to Dermatology  Allergic rhinitis due to pollen, unspecified seasonality Assessment & Plan: Reports stable overall.  He uses an otc antinhistamine.    Gastroesophageal reflux disease, unspecified whether esophagitis present Assessment & Plan: Reports that his symptoms are stable- has breakthrough symptoms if he forgets to take it.     Primary hypertension Assessment & Plan: BP Readings from Last 3 Encounters:  05/17/23 139/82  11/16/22 137/84  09/21/22 138/85   Maintained on amlodipine/toprol  xl.   Orders: -     Basic metabolic panel  H/O splenectomy -     Cefdinir; Take 1 capsule (300 mg total) by mouth 2 (two) times daily. As needed for illness/fever  Dispense: 14 capsule; Refill: 0  OAB (overactive bladder) Assessment & Plan: Stable with QOD gemtesa. Continue same.    Other orders -     Metoprolol Succinate ER; TAKE 1 TABLET BY MOUTH EVERY DAY WITH OR IMMEDIATELY FOLLOWING A MEAL  Dispense: 90 tablet; Refill: 1 -     amLODIPine Besylate; Take 1 tablet (10 mg total) by mouth daily.  Dispense: 90 tablet; Refill: 1    I, Lemont Fillers, NP, personally preformed the services described in this documentation.  All medical record entries made by the scribe were at my direction and in my presence.  I have reviewed the chart and discharge instructions (if applicable) and agree that the record reflects my personal performance and is accurate and complete. 05/17/2023   I,Darin Smith,acting as a scribe for Lemont Fillers, NP.,have documented all relevant documentation on the behalf of Lemont Fillers, NP,as directed by  Lemont Fillers, NP while in the presence of Lemont Fillers, NP.   Lemont Fillers, NP

## 2023-05-17 NOTE — Assessment & Plan Note (Signed)
Reports that his symptoms are stable- has breakthrough symptoms if he forgets to take it.

## 2023-05-17 NOTE — Assessment & Plan Note (Signed)
Reports stable overall.  He uses an otc antinhistamine.

## 2023-08-16 ENCOUNTER — Ambulatory Visit: Payer: BC Managed Care – PPO | Admitting: Family Medicine

## 2023-08-16 VITALS — BP 138/86 | Ht 73.0 in | Wt 235.0 lb

## 2023-08-16 DIAGNOSIS — M79671 Pain in right foot: Secondary | ICD-10-CM | POA: Diagnosis not present

## 2023-08-16 NOTE — Progress Notes (Cosign Needed)
PCP: [name], NP  Subjective:   HPI: Patient is a 52 y.o. male here for right heel pain.  Pain started on Thursday of last week.  No injury.  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  Pain is exacerbated with walking.  He has tried NSAIDs, Biofreeze without relief.  Additionally he wears orthotics he got from [employer].  He is not limited in ADLs nor work.  Of note he was previously treated for left Achilles tendinitis approximately 4 years ago.  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 90 tablet 1   cefdinir (OMNICEF) 300 MG capsule Take 1 capsule (300 mg total) by mouth 2 (two) times daily. As needed for illness/fever 14 capsule 0   loratadine (CLARITIN) 10 MG tablet Take 10 mg by mouth daily.     metoprolol succinate (TOPROL-XL) 100 MG 24 hr tablet TAKE 1 TABLET BY MOUTH EVERY DAY WITH OR IMMEDIATELY FOLLOWING A MEAL 90 tablet 1   omeprazole (PRILOSEC) 20 MG capsule TAKE 1 CAPSULE BY MOUTH EVERY DAY 90 capsule 1   Vibegron (GEMTESA) 75 MG TABS Take by mouth.     No current facility-administered medications on file prior to visit.    Past Surgical History:  Procedure Laterality Date   BIOPSY PROSTATE  5/13   Dr. Su Grand- negative   COLONOSCOPY  09/12/2019   LAPAROSCOPIC PARTIAL COLECTOMY Right 10/11/2019   Procedure: LAPAROSCOPIC RIGHT HEMICOLECTOMY;  Surgeon: Andria Meuse, MD;  Location: [facility];  Service: General;  Laterality: Right;   SPLENECTOMY      No Known Allergies  BP 138/86   Ht 6\' 1"  (1.854 m)   Wt 235 lb (106.6 kg)   BMI 31.00 kg/m       No data to display              No data to display              Objective:  Physical Exam:  Gen: NAD, comfortable in exam room  Right foot: No gross deformity, no ecchymosis, no swelling.  Mild TTP over peroneal tendons.  Difficult to reproduce pain on exam. FROM. 5/5 strength, normal sensation.  Limited MSK u/s right foot/ankle:  Mild increase in fluid surrounding peroneal tendons.  No visible tears.  Achilles intact without anechoic/hypoechoic change.  Assessment & Plan:  1.  Right heel pain: 2/2 peroneal tendinopathy. POCUS with evidence of fluid around the peroneal tendons.  Okay to continue using current orthotics, however if not improving may need new orthotics. Recommend Voltaren gel. Home exercise plan demonstrated.  Follow-up in 4 to 6 weeks.

## 2023-08-16 NOTE — Patient Instructions (Signed)
You strained your peroneal tendons. Icing 15 minutes at a time as needed. Voltaren gel up to 4 times a day (can take aleve twice a day with food as needed instead). Arch supports are important. Do home exercises daily as directed. Follow up with [place] in 1 month but call sooner if you're struggling.

## 2023-08-17 ENCOUNTER — Encounter: Payer: Self-pay | Admitting: Family Medicine

## 2023-09-01 ENCOUNTER — Other Ambulatory Visit: Payer: Self-pay | Admitting: Family

## 2023-09-10 ENCOUNTER — Other Ambulatory Visit: Payer: Self-pay | Admitting: Family

## 2023-11-10 ENCOUNTER — Other Ambulatory Visit: Payer: Self-pay | Admitting: Family

## 2023-11-10 NOTE — Telephone Encounter (Signed)
Please contact pt to schedule a follow up visit.  

## 2023-11-11 NOTE — Telephone Encounter (Signed)
Pt will call back to schedule

## 2024-01-05 ENCOUNTER — Ambulatory Visit (INDEPENDENT_AMBULATORY_CARE_PROVIDER_SITE_OTHER): Payer: BC Managed Care – PPO | Admitting: Family

## 2024-01-05 VITALS — BP 136/86 | HR 72 | Temp 98.7°F | Resp 16 | Ht 73.0 in | Wt 241.0 lb

## 2024-01-05 DIAGNOSIS — Z9081 Acquired absence of spleen: Secondary | ICD-10-CM

## 2024-01-05 DIAGNOSIS — J069 Acute upper respiratory infection, unspecified: Secondary | ICD-10-CM | POA: Diagnosis not present

## 2024-01-05 DIAGNOSIS — R52 Pain, unspecified: Secondary | ICD-10-CM

## 2024-01-05 DIAGNOSIS — I10 Essential (primary) hypertension: Secondary | ICD-10-CM

## 2024-01-05 LAB — POC INFLUENZA A&B (BINAX/QUICKVUE)
Influenza A, POC: NEGATIVE
Influenza B, POC: NEGATIVE

## 2024-01-05 LAB — POC COVID19 BINAXNOW: SARS Coronavirus 2 Ag: NEGATIVE

## 2024-01-05 MED ORDER — CEFDINIR 300 MG PO CAPS: 300.0000 mg | ORAL_CAPSULE | Freq: Two times a day (BID) | ORAL | 0 refills | Status: DC

## 2024-01-05 MED ORDER — METOPROLOL SUCCINATE ER 100 MG PO TB24: ORAL_TABLET | ORAL | 1 refills | Status: DC

## 2024-01-05 MED ORDER — AMLODIPINE BESYLATE 10 MG PO TABS: 10.0000 mg | ORAL_TABLET | Freq: Every day | ORAL | 0 refills | Status: DC

## 2024-01-05 NOTE — Assessment & Plan Note (Signed)
 Given hx of acute illness and asplenia, will plan empiric rx with cefdinir.

## 2024-01-05 NOTE — Patient Instructions (Addendum)
 VISIT SUMMARY:  You came in today feeling unwell with body aches and chills for the past 2-3 days. You have been taking Mucinex and high blood pressure Nyquil for relief. You mentioned a household member recently had a sinus infection. We also discussed your recent visit to the urologist.  YOUR PLAN:  -ACUTE ILLNESS: You have symptoms of a viral illness, which include body aches and chills. We performed swabs for flu and COVID-19 which were both negative. You should start taking Cefdinir  as a precaution due to your history of spleen removal. Continue with supportive measures like Mucinex, high blood pressure versions of Coricidin or Nyquil, nasal saline, and Tylenol . If your symptoms worsen or do not improve within 7-10 days, please contact our office.  -MEDICATION REFILLS: We have sent refills for your medications, Toprol  and Amlodipine , to [facility].  -FOLLOW-UP: Since you were feeling unwell today, we were unable to complete your physical. Please schedule a follow-up appointment for a physical when you are feeling better.  INSTRUCTIONS:  If your symptoms worsen or persist beyond 7-10 days, please notify our office. Additionally, schedule a follow-up appointment for a physical at your convenience when you are feeling better.

## 2024-01-05 NOTE — Progress Notes (Signed)
 Subjective:     Patient ID: Darin Smith, male    DOB: [birth year], 53 y.o.   MRN: [id number]  Chief Complaint  Patient presents with   Sinus Problem    Patient complains of sinus congestion and pressure, this started 3 days ago.    Generalized Body Aches    Patient complains of bogy aches that started today    Sinus Problem    Discussed the use of AI scribe software for clinical note transcription with the patient, who gave verbal consent to proceed.  History of Present Illness   The patient, with a history of hypertension and a spleenectomy, presents with a 2-3 day history of feeling unwell. He reports body aches and chills, but denies fever. He has been taking Mucinex and high blood pressure Nyquil for symptom relief. He has not been around anyone who has been sick, except for a household member who was recently diagnosed with a sinus infection. The patient also mentions a recent visit to the urologist, where he was informed that his PSA was down.           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 Oliva Oiler- negative   COLONOSCOPY  09/12/2019   LAPAROSCOPIC PARTIAL COLECTOMY Right 10/11/2019   Procedure: LAPAROSCOPIC RIGHT HEMICOLECTOMY;  Surgeon: Teresa Lonni HERO, MD;  Location: [facility];  Service: General;  Laterality: Right;   SPLENECTOMY      Family History  Problem Relation Age of Onset   Lupus Mother     Vision loss Mother        in left eye   Hypertension Father    COPD Father    Obesity Sister    Thyroid  disease Sister    Multiple sclerosis Sister    Cancer Neg Hx        prostate   Colon cancer Neg Hx    Colon polyps Neg Hx    Esophageal cancer Neg Hx    Rectal cancer Neg Hx    Stomach cancer Neg Hx     Social History   Socioeconomic History   Marital status: Married    Spouse name: Not on file   Number of children: Not on file   Years of education: Not on file   Highest education level: 12th grade  Occupational History   Not on file  Tobacco Use   Smoking status: Some Days    Types: Cigars   Smokeless tobacco: Never   Tobacco comments:    Occasional cigars  Vaping Use   Vaping status: Never Used  Substance and Sexual Activity   Alcohol use: Not Currently    Alcohol/week: 1.0 standard drink of alcohol    Types: 1 Shots of liquor per week    Comment: daily   Drug use: No   Sexual activity: Yes  Partners: Female  Other Topics Concern   Not on file  Social History Narrative   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 [place]   Social Drivers of Health   Financial Resource Strain: Low Risk  (05/10/2023)   Overall Financial Resource Strain (CARDIA)    Difficulty of Paying Living Expenses: Not hard at all  Food Insecurity: No Food Insecurity (05/10/2023)   Hunger Vital Sign    Worried About Running Out of Food in the Last Year: Never true    Ran Out of Food in the Last Year: Never true  Transportation Needs: No Transportation Needs (05/10/2023)   PRAPARE - Administrator, Civil Service (Medical): No    Lack of Transportation (Non-Medical): No  Physical Activity: Unknown (05/10/2023)   Exercise Vital Sign    Days of Exercise per Week: 0 days    Minutes of Exercise per Session: Not on file  Stress: No Stress Concern Present (05/10/2023)   [employer] of Occupational Health - Occupational Stress Questionnaire    Feeling of Stress : Not at all  Social Connections: Moderately Integrated (05/10/2023)   Social Connection and Isolation Panel [NHANES]    Frequency of Communication with Friends and Family: More than three times a week    Frequency of Social Gatherings with Friends and Family: Once a week    Attends Religious Services: Never    Database Administrator or Organizations: Yes    Attends Engineer, Structural: More than 4 times per year    Marital Status: Married  Catering Manager Violence: Not on file    Outpatient Medications Prior to Visit  Medication Sig Dispense Refill   loratadine  (CLARITIN ) 10 MG tablet Take 10 mg by mouth daily.     omeprazole  (PRILOSEC) 20 MG capsule TAKE 1 CAPSULE BY MOUTH EVERY DAY 90 capsule 1   Vibegron (GEMTESA) 75 MG TABS Take by mouth.     amLODipine  (NORVASC ) 10 MG tablet TAKE 1 TABLET BY MOUTH EVERY DAY 90 tablet 0   cefdinir  (OMNICEF ) 300 MG capsule Take 1 capsule (300 mg total) by mouth 2 (two) times daily. As needed for illness/fever 14 capsule 0   metoprolol  succinate (TOPROL -XL) 100 MG 24 hr tablet TAKE 1 TABLET BY MOUTH EVERY DAY WITH OR IMMEDIATELY FOLLOWING A MEAL 90 tablet 1   No facility-administered medications prior to visit.    No Known Allergies  ROS See HPI    Objective:    Physical Exam Constitutional:      General: He is not in acute distress.    Appearance: He is well-developed.  HENT:     Head: Normocephalic and atraumatic.     Right Ear: Ear canal normal. A middle ear effusion is present. Tympanic membrane is not erythematous, retracted or bulging.     Left Ear: Ear canal normal. A middle ear effusion is present. Tympanic membrane is not erythematous, retracted or bulging.  Cardiovascular:     Rate and Rhythm: Normal rate and regular rhythm.     Heart sounds: No murmur heard. Pulmonary:     Effort: Pulmonary effort is normal. No respiratory  distress.     Breath sounds: Normal breath sounds. No wheezing or rales.  Skin:    General: Skin is warm and dry.  Neurological:     Mental Status: He is alert and oriented to person,  place, and time.  Psychiatric:        Behavior: Behavior normal.        Thought Content: Thought content normal.      BP 136/86 (BP Location: Right Arm, Patient Position: Sitting, Cuff Size: Large)   Pulse 72   Temp 98.7 F (37.1 C) (Oral)   Resp 16   Ht 6' 1 (1.854 m)   Wt 241 lb (109.3 kg)   SpO2 98%   BMI 31.80 kg/m  Wt Readings from Last 3 Encounters:  01/05/24 241 lb (109.3 kg)  08/16/23 235 lb (106.6 kg)  05/17/23 241 lb (109.3 kg)       Assessment & Plan:   Problem List Items Addressed This Visit       Unprioritized   Viral URI   New.  Flu A/B and covid testing are both negative.   -Perform swabs for flu and COVID-19.  -Start Cefdinir  as a precaution due to patient's history of splenectomy.  -Advise supportive measures including Mucinex, high blood pressure versions of Coricidin or Nyquil, nasal saline, and Tylenol .  -If symptoms worsen or persist beyond 7-10 days, patient should notify the office.       Relevant Medications   cefdinir  (OMNICEF ) 300 MG capsule   HTN (hypertension)   BP stable, continue toprol  xl and amlodipine .       Relevant Medications   metoprolol  succinate (TOPROL -XL) 100 MG 24 hr tablet   amLODipine  (NORVASC ) 10 MG tablet   H/O splenectomy   Given hx of acute illness and asplenia, will plan empiric rx with cefdinir .      Relevant Medications   cefdinir  (OMNICEF ) 300 MG capsule   Other Visit Diagnoses       Body aches    -  Primary   Relevant Orders   POC COVID-19   POC Influenza A&B (Binax test)       I have changed Darin Smith's amLODipine . I am also having him maintain his loratadine , Gemtesa, omeprazole , cefdinir , and metoprolol  succinate.  Meds ordered this encounter  Medications   cefdinir  (OMNICEF ) 300 MG capsule     Sig: Take 1 capsule (300 mg total) by mouth 2 (two) times daily. As needed for illness/fever    Dispense:  14 capsule    Refill:  0    Supervising Provider:   DOMENICA BLACKBIRD A [[id number]]   metoprolol  succinate (TOPROL -XL) 100 MG 24 hr tablet    Sig: TAKE 1 TABLET BY MOUTH EVERY DAY WITH OR IMMEDIATELY FOLLOWING A MEAL    Dispense:  90 tablet    Refill:  1    Supervising Provider:   DOMENICA BLACKBIRD A [[id number]]   amLODipine  (NORVASC ) 10 MG tablet    Sig: Take 1 tablet (10 mg total) by mouth daily.    Dispense:  90 tablet    Refill:  0    Supervising Provider:   DOMENICA BLACKBIRD A [[id number]]

## 2024-01-05 NOTE — Assessment & Plan Note (Signed)
 New.  Flu A/B and covid testing are both negative.   -Perform swabs for flu and COVID-19.  -Start Cefdinir  as a precaution due to patient's history of splenectomy.  -Advise supportive measures including Mucinex, high blood pressure versions of Coricidin or Nyquil, nasal saline, and Tylenol .  -If symptoms worsen or persist beyond 7-10 days, patient should notify the office.

## 2024-01-05 NOTE — Assessment & Plan Note (Signed)
 BP stable, continue toprol xl and amlodipine.

## 2024-01-31 ENCOUNTER — Ambulatory Visit: Payer: BC Managed Care – PPO | Admitting: Dermatology

## 2024-01-31 ENCOUNTER — Encounter: Payer: Self-pay | Admitting: Family

## 2024-01-31 ENCOUNTER — Ambulatory Visit (INDEPENDENT_AMBULATORY_CARE_PROVIDER_SITE_OTHER): Payer: BC Managed Care – PPO | Admitting: Family

## 2024-01-31 VITALS — BP 137/82 | HR 77 | Temp 97.5°F | Resp 16 | Ht 73.0 in | Wt 240.0 lb

## 2024-01-31 DIAGNOSIS — R972 Elevated prostate specific antigen [PSA]: Secondary | ICD-10-CM

## 2024-01-31 DIAGNOSIS — Z23 Encounter for immunization: Secondary | ICD-10-CM

## 2024-01-31 DIAGNOSIS — Z9081 Acquired absence of spleen: Secondary | ICD-10-CM | POA: Diagnosis not present

## 2024-01-31 DIAGNOSIS — N3281 Overactive bladder: Secondary | ICD-10-CM

## 2024-01-31 DIAGNOSIS — Z0000 Encounter for general adult medical examination without abnormal findings: Secondary | ICD-10-CM

## 2024-01-31 DIAGNOSIS — I10 Essential (primary) hypertension: Secondary | ICD-10-CM | POA: Diagnosis not present

## 2024-01-31 DIAGNOSIS — E781 Pure hyperglyceridemia: Secondary | ICD-10-CM | POA: Diagnosis not present

## 2024-01-31 LAB — COMPREHENSIVE METABOLIC PANEL
ALT: 30 U/L (ref 0–53)
AST: 22 U/L (ref 0–37)
Albumin: 4.6 g/dL (ref 3.5–5.2)
Alkaline Phosphatase: 66 U/L (ref 39–117)
BUN: 13 mg/dL (ref 6–23)
CO2: 30 meq/L (ref 19–32)
Calcium: 9.7 mg/dL (ref 8.4–10.5)
Chloride: 103 meq/L (ref 96–112)
Creatinine, Ser: 1.22 mg/dL (ref 0.40–1.50)
GFR: 68.26 mL/min (ref >60.00)
Glucose, Bld: 90 mg/dL (ref 70–99)
Potassium: 4.9 meq/L (ref 3.5–5.1)
Sodium: 139 meq/L (ref 135–145)
Total Bilirubin: 0.6 mg/dL (ref 0.2–1.2)
Total Protein: 7.4 g/dL (ref 6.0–8.3)

## 2024-01-31 LAB — LIPID PANEL
Cholesterol: 231 mg/dL — ABNORMAL HIGH (ref 0–200)
HDL: 47.8 mg/dL (ref >39.00)
LDL Cholesterol: 137 mg/dL — ABNORMAL HIGH (ref 0–99)
NonHDL: 183.02
Total CHOL/HDL Ratio: 5
Triglycerides: 231 mg/dL — ABNORMAL HIGH (ref 0.0–149.0)
VLDL: 46.2 mg/dL — ABNORMAL HIGH (ref 0.0–40.0)

## 2024-01-31 MED ORDER — CEFDINIR 300 MG PO CAPS: 300.0000 mg | ORAL_CAPSULE | Freq: Two times a day (BID) | ORAL | 0 refills | Status: DC

## 2024-01-31 MED ORDER — AMLODIPINE BESYLATE 10 MG PO TABS: 10.0000 mg | ORAL_TABLET | Freq: Every day | ORAL | 0 refills | Status: DC

## 2024-01-31 NOTE — Assessment & Plan Note (Signed)
Stable on gemtesa which is being decreased by his urologist.

## 2024-01-31 NOTE — Assessment & Plan Note (Signed)
  Controlled on Amlodipine and Metoprolol. Discussed the importance of weight loss in managing hypertension. -Continue Amlodipine and Metoprolol. -Set a goal for weight loss to potentially decrease medication dosage.

## 2024-01-31 NOTE — Assessment & Plan Note (Addendum)
-   colo, dental vision up to date -Administer influenza vaccine today. -Recommended COVID-19 booster at local pharmacy. -Refill Omeprazole, Metoprolol, and Amlodipine prescriptions.

## 2024-01-31 NOTE — Assessment & Plan Note (Signed)
This is being followed by urology

## 2024-01-31 NOTE — Patient Instructions (Signed)
VISIT SUMMARY:  You came in today for your annual physical exam. We discussed your current health status, including your blood pressure management, skin concerns, and general health maintenance. You are doing well overall, but there are a few areas we need to monitor and address.  YOUR PLAN:  -HYPERTENSION: Hypertension, or high blood pressure, is when the force of the blood against your artery walls is too high. Your blood pressure is currently well-controlled with Amlodipine and Metoprolol. We discussed the importance of weight loss in managing your blood pressure, so please continue taking your medications and set a goal for weight loss to potentially decrease your medication dosage.  -SKIN CONCERNS: You have a skin tag and new moles that need to be monitored. You have a dermatology appointment scheduled, so please continue to monitor any skin changes and attend your rescheduled appointment.  -GENERAL HEALTH MAINTENANCE: We administered your influenza vaccine today and recommended that you get a COVID-19 booster at your local pharmacy. Your prescriptions for Omeprazole, Metoprolol, and Amlodipine have been refilled, and we have sent a prescription for omnicef to have on standby. Please continue with your current medications and follow a healthy lifestyle, including a balanced diet and regular exercise.  INSTRUCTIONS:  Please follow up in 6 months or sooner if any concerns arise. Additionally, remember to attend your upcoming vision appointment in two weeks and your dermatology appointment in June.

## 2024-01-31 NOTE — Progress Notes (Signed)
Subjective:     Patient ID: Darin Smith, male    DOB: [birth year], 53 y.o.   MRN: [id number]  Chief Complaint  Patient presents with   Annual Exam    HPI  Discussed the use of AI scribe software for clinical note transcription with the patient, who gave verbal consent to proceed.  History of Present Illness   The patient presents for an annual physical exam.  He is up to date with dental care and has been replacing silver fillings with white enamel, which has caused temporary sensitivity.  He has not received a flu shot this season and is considering getting a COVID booster at a local pharmacy.  He experiences difficulty maintaining a healthy diet due to his job as a Loss adjuster, chartered, which requires frequent travel. He enjoys salads and vegetables but finds it challenging to access them regularly. He attempts to exercise by walking during his breaks and has considered incorporating jump rope into his routine.  He has a follow-up vision appointment in two weeks due to concerns about potential glaucoma in his right eye. His mother had glaucoma and sickle cell disease, but his siblings do not have vision problems. He has noticed a small spot on his retina, which was identified during a routine eye exam.  He experienced a prolonged recovery from a recent illness, with persistent phlegm lasting over two weeks. No current cough, cold symptoms, leg swelling, digestive issues, urinary problems, or headaches.  He is currently taking amlodipine and metoprolol for blood pressure management and has been weaning off Jinteli, taking it every other day for about a year.  He has an upcoming dermatology appointment in June due to concerns about a skin tag and increasing moles on his head.      Immunizations: flu shot today, covid at pharmacy Diet:needs improvement Exercise: he is trying to walk on his brakes Wt Readings from Last 3 Encounters:  01/31/24 240 lb (108.9 kg)  01/05/24 241 lb (109.3  kg)  08/16/23 235 lb (106.6 kg)  Colonoscopy: 2020 PSA: he is seeing urology Lab Results  Component Value Date   PSA 4.88 12/31/2020   PSA 4.01 (H) 09/12/2011   PSA 3.83 08/04/2011  Vision: up to date, he is being evaluated for possible glaucoma (Dr. 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 Su Grand- negative   COLONOSCOPY  09/12/2019   LAPAROSCOPIC PARTIAL COLECTOMY Right 10/11/2019   Procedure: LAPAROSCOPIC RIGHT HEMICOLECTOMY;  Surgeon: Andria Meuse, MD;  Location: [facility];  Service: General;  Laterality: Right;   SPLENECTOMY      Family History  Problem Relation Age of Onset   Lupus Mother    Vision loss Mother        in left eye   Hypertension Father    COPD Father  Obesity Sister    Thyroid disease Sister    Multiple sclerosis Sister    Cancer Neg Hx        prostate   Colon cancer Neg Hx    Colon polyps Neg Hx    Esophageal cancer Neg Hx    Rectal cancer Neg Hx    Stomach cancer Neg Hx     Social History   Socioeconomic History   Marital status: Married    Spouse name: Not on file   Number of children: Not on file   Years of education: Not on file   Highest education level: 12th grade  Occupational History   Not on file  Tobacco Use   Smoking status: Some Days    Types: Cigars   Smokeless tobacco: Never   Tobacco comments:    Occasional cigars  Vaping Use   Vaping status: Never  Used  Substance and Sexual Activity   Alcohol use: Not Currently    Alcohol/week: 1.0 standard drink of alcohol    Types: 1 Shots of liquor per week    Comment: daily   Drug use: No   Sexual activity: Yes    Partners: Female  Other Topics Concern   Not on file  Social History Narrative   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 They live in [place]   Remarried 7/15   Social Drivers of Health   Financial Resource Strain: Low Risk  (01/30/2024)   Overall Financial Resource Strain (CARDIA)    Difficulty of Paying Living Expenses: Not hard at all  Food Insecurity: No Food Insecurity (01/30/2024)   Hunger Vital Sign    Worried About Running Out of Food in the Last Year: Never true    Ran Out of Food in the Last Year: Never true  Transportation Needs: No Transportation Needs (01/30/2024)   PRAPARE - Administrator, Civil Service (Medical): No    Lack of Transportation (Non-Medical): No  Physical Activity: Unknown (01/30/2024)   Exercise Vital Sign    Days of Exercise per Week: 0 days    Minutes of Exercise per Session: Not on file  Stress: No Stress Concern Present (01/30/2024)   [employer] of Occupational Health - Occupational Stress Questionnaire    Feeling of Stress : Not at all  Social Connections: Socially Integrated (01/30/2024)   Social Connection and Isolation Panel [NHANES]    Frequency of Communication with Friends and Family: More than three times a week    Frequency of Social Gatherings with Friends and Family: Once a week    Attends Religious Services: 1 to 4 times per year    Active Member of [employer] or Organizations: Yes    Attends Engineer, structural: More than 4 times per year    Marital Status: Married  Catering manager Violence: Not on file    Outpatient Medications Prior to Visit  Medication Sig Dispense Refill   loratadine (CLARITIN) 10 MG tablet Take 10 mg by  mouth daily.     metoprolol succinate (TOPROL-XL) 100 MG 24 hr tablet TAKE 1 TABLET BY MOUTH EVERY DAY WITH OR IMMEDIATELY FOLLOWING A MEAL 90 tablet 1   omeprazole (PRILOSEC) 20 MG capsule TAKE 1 CAPSULE BY MOUTH EVERY DAY 90 capsule 1   Vibegron (GEMTESA) 75 MG TABS Take by mouth.     amLODipine (NORVASC) 10 MG  tablet Take 1 tablet (10 mg total) by mouth daily. 90 tablet 0   cefdinir (OMNICEF) 300 MG capsule Take 1 capsule (300 mg total) by mouth 2 (two) times daily. As needed for illness/fever (Patient not taking: Reported on 01/31/2024) 14 capsule 0   No facility-administered medications prior to visit.    No Known Allergies  Review of Systems  Constitutional:  Negative for weight loss.  HENT:  Negative for congestion.   Eyes:  Positive for photophobia.  Respiratory:  Negative for cough.   Cardiovascular:  Negative for leg swelling.  Gastrointestinal:  Negative for constipation and diarrhea.  Genitourinary:  Negative for dysuria and frequency.  Musculoskeletal:        Some left foot pain   Skin:  Negative for rash.  Neurological:  Negative for headaches.  Psychiatric/Behavioral:         Denies depression/anxiety       Objective:    Physical Exam   BP 137/82 (BP Location: Right Arm, Patient Position: Sitting, Cuff Size: Large)   Pulse 77   Temp (!) 97.5 F (36.4 C) (Oral)   Resp 16   Ht 6\' 1"  (1.854 m)   Wt 240 lb (108.9 kg)   SpO2 99%   BMI 31.66 kg/m  Wt Readings from Last 3 Encounters:  01/31/24 240 lb (108.9 kg)  01/05/24 241 lb (109.3 kg)  08/16/23 235 lb (106.6 kg)   Physical Exam  Constitutional: He is oriented to person, place, and time. He appears well-developed and well-nourished. No distress.  HENT:  Head: Normocephalic and atraumatic.  Right Ear: Tympanic membrane and ear canal normal.  Left Ear: Tympanic membrane and ear canal normal.  Mouth/Throat: Oropharynx is clear and moist.  Eyes: Pupils are equal, round, and reactive to light. No scleral  icterus.  Neck: Normal range of motion. No thyromegaly present.  Cardiovascular: Normal rate and regular rhythm.   No murmur heard. Pulmonary/Chest: Effort normal and breath sounds normal. No respiratory distress. He has no wheezes. He has no rales. He exhibits no tenderness.  Abdominal: Soft. Bowel sounds are normal. He exhibits no distension and no mass. There is no tenderness. There is no rebound and no guarding.  Musculoskeletal: He exhibits no edema.  Lymphadenopathy:    He has no cervical adenopathy.  Neurological: He is alert and oriented to person, place, and time. He has normal patellar reflexes. He exhibits normal muscle tone. Coordination normal.  Skin: Skin is warm and dry.  Psychiatric: He has a normal mood and affect. His behavior is normal. Judgment and thought content normal.           Assessment & Plan:       Assessment & Plan:   Problem List Items Addressed This Visit       Unprioritized   Preventative health care    -Administer influenza vaccine today. -Recommended COVID-19 booster at local pharmacy. -Refill Omeprazole, Metoprolol, and Amlodipine prescriptions.       OAB (overactive bladder)   Stable on gemtesa which is being decreased by his urologist.       HTN (hypertension)    Controlled on Amlodipine and Metoprolol. Discussed the importance of weight loss in managing hypertension. -Continue Amlodipine and Metoprolol. -Set a goal for weight loss to potentially decrease medication dosage.      Relevant Medications   amLODipine (NORVASC) 10 MG tablet   H/O splenectomy   -Send a prescription for omnicef to have on standby.      Relevant Medications  cefdinir (OMNICEF) 300 MG capsule   Elevated PSA   This is being followed by urology.       Other Visit Diagnoses       Hypertriglyceridemia    -  Primary   Relevant Medications   amLODipine (NORVASC) 10 MG tablet   Other Relevant Orders   Comp Met (CMET)   Lipid panel     Needs flu  shot       Relevant Orders   Flu vaccine trivalent PF, 6mos and older(Flulaval,Afluria,Fluarix,Fluzone) (Completed)       I am having Darin Smith "Lamont" maintain his loratadine, Gemtesa, omeprazole, metoprolol succinate, cefdinir, and amLODipine.  Meds ordered this encounter  Medications   cefdinir (OMNICEF) 300 MG capsule    Sig: Take 1 capsule (300 mg total) by mouth 2 (two) times daily. As needed for illness/fever    Dispense:  14 capsule    Refill:  0    Supervising Provider:   Danise Edge A [[id number]]   amLODipine (NORVASC) 10 MG tablet    Sig: Take 1 tablet (10 mg total) by mouth daily.    Dispense:  90 tablet    Refill:  0    Supervising Provider:   Danise Edge A [[id number]]

## 2024-01-31 NOTE — Assessment & Plan Note (Signed)
-  Send a prescription for omnicef to have on standby.

## 2024-02-01 ENCOUNTER — Telehealth: Payer: Self-pay | Admitting: Family

## 2024-02-01 DIAGNOSIS — E785 Hyperlipidemia, unspecified: Secondary | ICD-10-CM

## 2024-02-01 MED ORDER — ATORVASTATIN CALCIUM 20 MG PO TABS: 20.0000 mg | ORAL_TABLET | Freq: Every day | ORAL | 1 refills | Status: DC

## 2024-02-01 NOTE — Telephone Encounter (Signed)
 Pleas contact pt and let him know that his cholesterol is elevated.  I would recommend that he begin atorvastatin  to lower his cholesterol and to lower his risk of heart attack or stroke. Also discontinue all tobacco products- I think he said that he sometimes smokes cigars. Repeat lipid panel in 6 weeks.

## 2024-02-02 NOTE — Telephone Encounter (Signed)
Patient notified of results, new prescription, provider's recommendations and scheduled to return in 6 weeks for lipid check

## 2024-03-20 ENCOUNTER — Other Ambulatory Visit (INDEPENDENT_AMBULATORY_CARE_PROVIDER_SITE_OTHER): Payer: BC Managed Care – PPO

## 2024-03-20 ENCOUNTER — Encounter: Payer: Self-pay | Admitting: Family

## 2024-03-20 DIAGNOSIS — E785 Hyperlipidemia, unspecified: Secondary | ICD-10-CM | POA: Diagnosis not present

## 2024-03-20 LAB — LIPID PANEL
Cholesterol: 127 mg/dL (ref 0–200)
HDL: 34.5 mg/dL — ABNORMAL LOW (ref >39.00)
LDL Cholesterol: 67 mg/dL (ref 0–99)
NonHDL: 92.3
Total CHOL/HDL Ratio: 4
Triglycerides: 128 mg/dL (ref 0.0–149.0)
VLDL: 25.6 mg/dL (ref 0.0–40.0)

## 2024-07-18 ENCOUNTER — Other Ambulatory Visit: Payer: Self-pay | Admitting: Family

## 2024-07-18 DIAGNOSIS — E785 Hyperlipidemia, unspecified: Secondary | ICD-10-CM

## 2024-08-04 ENCOUNTER — Ambulatory Visit: Payer: BC Managed Care – PPO | Admitting: Family

## 2024-08-04 ENCOUNTER — Ambulatory Visit: Payer: Self-pay | Admitting: Family

## 2024-08-04 VITALS — BP 150/100 | HR 65 | Temp 98.6°F | Resp 16 | Ht 73.0 in | Wt 243.0 lb

## 2024-08-04 DIAGNOSIS — I10 Essential (primary) hypertension: Secondary | ICD-10-CM | POA: Diagnosis not present

## 2024-08-04 DIAGNOSIS — N3281 Overactive bladder: Secondary | ICD-10-CM

## 2024-08-04 DIAGNOSIS — R0681 Apnea, not elsewhere classified: Secondary | ICD-10-CM

## 2024-08-04 DIAGNOSIS — Z23 Encounter for immunization: Secondary | ICD-10-CM | POA: Diagnosis not present

## 2024-08-04 DIAGNOSIS — Z9081 Acquired absence of spleen: Secondary | ICD-10-CM

## 2024-08-04 DIAGNOSIS — E785 Hyperlipidemia, unspecified: Secondary | ICD-10-CM | POA: Insufficient documentation

## 2024-08-04 DIAGNOSIS — R972 Elevated prostate specific antigen [PSA]: Secondary | ICD-10-CM

## 2024-08-04 DIAGNOSIS — R739 Hyperglycemia, unspecified: Secondary | ICD-10-CM | POA: Insufficient documentation

## 2024-08-04 DIAGNOSIS — K219 Gastro-esophageal reflux disease without esophagitis: Secondary | ICD-10-CM

## 2024-08-04 DIAGNOSIS — J301 Allergic rhinitis due to pollen: Secondary | ICD-10-CM

## 2024-08-04 LAB — BASIC METABOLIC PANEL WITH GFR
BUN: 10 mg/dL (ref 6–23)
CO2: 31 meq/L (ref 19–32)
Calcium: 9.7 mg/dL (ref 8.4–10.5)
Chloride: 99 meq/L (ref 96–112)
Creatinine, Ser: 1.11 mg/dL (ref 0.40–1.50)
GFR: 76.18 mL/min (ref >60.00)
Glucose, Bld: 117 mg/dL — ABNORMAL HIGH (ref 70–99)
Potassium: 4.7 meq/L (ref 3.5–5.1)
Sodium: 140 meq/L (ref 135–145)

## 2024-08-04 MED ORDER — VALSARTAN 160 MG PO TABS
160.0000 mg | ORAL_TABLET | Freq: Every day | ORAL | 0 refills | Status: DC
Start: 2024-08-04 — End: 2024-08-29

## 2024-08-04 NOTE — Patient Instructions (Signed)
 VISIT SUMMARY:  Today, we reviewed your blood pressure management, discussed your sleep concerns, and addressed your general health maintenance needs.  YOUR PLAN:  ESSENTIAL HYPERTENSION: Your blood pressure is slightly elevated. -We are adding valsartan  150 mg once daily to your medication regimen. -Your valsartan  prescription has been sent to [facility]. -Please follow up in two weeks for a blood pressure recheck with the nurse. -Repeat blood work in two weeks.  SUSPECTED SLEEP-DISORDERED BREATHING (POSSIBLE SLEEP APNEA): Your wife has noticed episodes of apnea during your sleep. -We are referring you to a sleep specialist at [facility] for further evaluation.  OVERACTIVE BLADDER: Your symptoms are well-managed with Gemtesa. -Continue taking Gemtesa every other day.  ELEVATED PROSTATE SPECIFIC ANTIGEN (PSA): Your PSA levels are fluctuating but currently at 4.5. -Continue monitoring with your urologist.  GASTROESOPHAGEAL REFLUX DISEASE (GERD): Your symptoms are controlled with omeprazole . -Avoid foods that worsen your symptoms if you miss a dose of your medication.  ALLERGIC RHINITIS: Your symptoms are controlled with loratadine . -Continue taking loratadine  as needed.  GENERAL HEALTH MAINTENANCE: You are due for several vaccinations. -You received your pneumonia shot booster today. -We recommend getting your flu shot and updated COVID booster in September or October. -You received the first dose of the hepatitis B vaccine series today; the second dose is due in two months.

## 2024-08-04 NOTE — Assessment & Plan Note (Signed)
 Maintained on toprol  xl and amlodipine .

## 2024-08-04 NOTE — Assessment & Plan Note (Signed)
 Saw Urology last week reports PSA had gone down from previous and they continue monitor.

## 2024-08-04 NOTE — Assessment & Plan Note (Signed)
 Lab Results  Component Value Date   CHOL 127 03/20/2024   HDL 34.50 (L) 03/20/2024   LDLCALC 67 03/20/2024   LDLDIRECT 108.0 11/03/2019   TRIG 128.0 03/20/2024   CHOLHDL 4 03/20/2024   Stable on lipitor. Continue same.

## 2024-08-04 NOTE — Assessment & Plan Note (Signed)
 Prevnar 20 update today.

## 2024-08-04 NOTE — Assessment & Plan Note (Signed)
 Reports that allergies have been stable with daily with otc antihistamine.

## 2024-08-04 NOTE — Progress Notes (Signed)
 Subjective:     Patient ID: Darin Smith, male    DOB: [birth year], 53 y.o.   MRN: [id number]  Chief Complaint  Patient presents with   Hypertension    Here for follow up   Hyperlipidemia    Here for follow up    Hypertension  Hyperlipidemia    Discussed the use of AI scribe software for clinical note transcription with the patient, who gave verbal consent to proceed.  History of Present Illness  Darin Smith is a 53 year old male with hypertension who presents for a follow-up on his medications and blood pressure management.  He is currently taking amlodipine  10 mg and metoprolol  100 mg for hypertension without any issues.  His wife has observed episodes of apnea during sleep, and a previous sleep study noted loud snoring without a diagnosis of sleep apnea. He does not experience daytime fatigue.  He is monitored by urology for fluctuating PSA levels, with the most recent level at 4.5, a decrease from six months ago. He has not had a prostate biopsy since the last one performed by Dr. Jannelle.  He is taking Gemtesa for urinary frequency, which has improved his symptoms, reducing urination frequency during the day and at night.     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 Oliva Oiler- negative   COLONOSCOPY  09/12/2019   LAPAROSCOPIC PARTIAL COLECTOMY Right 10/11/2019   Procedure: LAPAROSCOPIC RIGHT  HEMICOLECTOMY;  Surgeon: Teresa Lonni HERO, MD;  Location: [facility];  Service: General;  Laterality: Right;   SPLENECTOMY      Family History  Problem Relation Age of Onset   Lupus Mother    Vision loss Mother        in left eye   Hypertension Father    COPD Father    Obesity Sister    Thyroid  disease Sister    Multiple sclerosis Sister    Cancer Neg Hx        prostate   Colon cancer Neg Hx    Colon polyps Neg Hx    Esophageal cancer Neg Hx    Rectal cancer Neg Hx    Stomach cancer Neg Hx     Social History   Socioeconomic History   Marital status: Married    Spouse name: Not on file   Number of children: Not on file   Years of education: Not on file   Highest education level: 12th grade  Occupational History   Not on file  Tobacco Use   Smoking status: Some Days    Types: Cigars   Smokeless tobacco: Never   Tobacco comments:    Occasional cigars  Vaping Use   Vaping status: Never Used  Substance and Sexual Activity   Alcohol use: Not Currently    Alcohol/week: 1.0 standard drink of alcohol  Types: 1 Shots of liquor per week    Comment: daily   Drug use: No   Sexual activity: Yes    Partners: Female  Other Topics Concern   Not on file  Social History Narrative   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 They live in [place]   Remarried 7/15   Social Drivers of Health   Financial Resource Strain: Low Risk  (01/30/2024)   Overall Financial Resource Strain (CARDIA)    Difficulty of Paying Living Expenses: Not hard at all  Food Insecurity: No Food Insecurity (01/30/2024)   Hunger Vital Sign    Worried About Running Out of Food in the Last Year: Never true    Ran Out of Food in the Last Year: Never true  Transportation Needs: No Transportation Needs (01/30/2024)   PRAPARE - Administrator, Civil Service (Medical): No    Lack of Transportation (Non-Medical): No   Physical Activity: Unknown (01/30/2024)   Exercise Vital Sign    Days of Exercise per Week: 0 days    Minutes of Exercise per Session: Not on file  Stress: No Stress Concern Present (01/30/2024)   [employer] of Occupational Health - Occupational Stress Questionnaire    Feeling of Stress : Not at all  Social Connections: Socially Integrated (01/30/2024)   Social Connection and Isolation Panel    Frequency of Communication with Friends and Family: More than three times a week    Frequency of Social Gatherings with Friends and Family: Once a week    Attends Religious Services: 1 to 4 times per year    Active Member of [employer] or Organizations: Yes    Attends Engineer, structural: More than 4 times per year    Marital Status: Married  Catering manager Violence: Not on file    Outpatient Medications Prior to Visit  Medication Sig Dispense Refill   amLODipine  (NORVASC ) 10 MG tablet Take 1 tablet (10 mg total) by mouth daily. 90 tablet 0   atorvastatin  (LIPITOR) 20 MG tablet Take 1 tablet (20 mg total) by mouth daily. 90 tablet 0   cefdinir  (OMNICEF ) 300 MG capsule Take 1 capsule (300 mg total) by mouth 2 (two) times daily. As needed for illness/fever 14 capsule 0   loratadine  (CLARITIN ) 10 MG tablet Take 10 mg by mouth daily.     metoprolol  succinate (TOPROL -XL) 100 MG 24 hr tablet TAKE 1 TABLET BY MOUTH EVERY DAY WITH OR IMMEDIATELY FOLLOWING A MEAL 90 tablet 1   omeprazole  (PRILOSEC) 20 MG capsule Take 1 capsule (20 mg total) by mouth daily. 90 capsule 0   Vibegron (GEMTESA) 75 MG TABS Take by mouth.     No facility-administered medications prior to visit.    No Known Allergies  ROS    See HPI Objective:    Physical Exam Constitutional:      General: He is not in acute distress.    Appearance: He is well-developed.  HENT:     Head: Normocephalic and atraumatic.  Cardiovascular:     Rate and Rhythm: Normal rate and regular rhythm.     Heart sounds: No murmur  heard. Pulmonary:     Effort: Pulmonary effort is normal. No respiratory distress.     Breath sounds: Normal breath sounds. No wheezing or rales.  Skin:    General: Skin is warm and dry.  Neurological:     Mental Status: He is alert and oriented to person, place, and time.  Psychiatric:        Behavior: Behavior normal.        Thought Content: Thought content normal.      BP (!) 150/100   Pulse 65   Temp 98.6 F (37 C) (Oral)   Resp 16   Ht 6' 1 (1.854 m)   Wt 243 lb (110.2 kg)   SpO2 99%   BMI 32.06 kg/m  Wt Readings from Last 3 Encounters:  08/04/24 243 lb (110.2 kg)  01/31/24 240 lb (108.9 kg)  01/05/24 241 lb (109.3 kg)       Assessment & Plan:   Problem List Items Addressed This Visit       Unprioritized   OAB (overactive bladder)   Maintained on gemtesa every other day and reports OAB symptoms are stable.       Hyperlipidemia   Lab Results  Component Value Date   CHOL 127 03/20/2024   HDL 34.50 (L) 03/20/2024   LDLCALC 67 03/20/2024   LDLDIRECT 108.0 11/03/2019   TRIG 128.0 03/20/2024   CHOLHDL 4 03/20/2024   Stable on lipitor. Continue same.      Relevant Medications   valsartan  (DIOVAN ) 160 MG tablet   HTN (hypertension)   Maintained on toprol  xl and amlodipine .       Relevant Medications   valsartan  (DIOVAN ) 160 MG tablet   Other Relevant Orders   Basic Metabolic Panel (BMET)   H/O splenectomy   Prevnar 20 update today.        GERD (gastroesophageal reflux disease)   Stable with regular use of prilosec.       Elevated PSA   Saw Urology last week reports PSA had gone down from previous and they continue monitor.       Allergic rhinitis   Reports that allergies have been stable with daily with otc antihistamine.       Other Visit Diagnoses       Witnessed episode of apnea    -  Primary   Relevant Orders   Ambulatory referral to Pulmonology     Need for pneumococcal 20-valent conjugate vaccination       Relevant Orders    Pneumococcal conjugate vaccine 20-valent (Prevnar 20) (Completed)     Need for hepatitis B vaccination       Relevant Orders   Heplisav-B  (HepB-CPG) Vaccine (Completed)       I am having Shamell L. Claudene Bound start on valsartan . I am also having him maintain his loratadine , Gemtesa, metoprolol  succinate, cefdinir , amLODipine , atorvastatin , and omeprazole .  Meds ordered this encounter  Medications   valsartan  (DIOVAN ) 160 MG tablet    Sig: Take 1 tablet (160 mg total) by mouth daily.    Dispense:  30 tablet    Refill:  0    Supervising Provider:   DOMENICA BLACKBIRD A [[id number]]

## 2024-08-04 NOTE — Assessment & Plan Note (Signed)
 Maintained on gemtesa every other day and reports OAB symptoms are stable.

## 2024-08-04 NOTE — Assessment & Plan Note (Signed)
 Stable with regular use of prilosec.

## 2024-08-04 NOTE — Telephone Encounter (Signed)
 Sugar is elevated. Please request pt return for A1C.

## 2024-08-07 ENCOUNTER — Other Ambulatory Visit (INDEPENDENT_AMBULATORY_CARE_PROVIDER_SITE_OTHER)

## 2024-08-07 DIAGNOSIS — R739 Hyperglycemia, unspecified: Secondary | ICD-10-CM | POA: Diagnosis not present

## 2024-08-08 ENCOUNTER — Telehealth: Payer: Self-pay | Admitting: Family

## 2024-08-08 DIAGNOSIS — E1165 Type 2 diabetes mellitus with hyperglycemia: Secondary | ICD-10-CM | POA: Insufficient documentation

## 2024-08-08 LAB — HEMOGLOBIN A1C: Hgb A1c MFr Bld: 7.6 % — ABNORMAL HIGH (ref 4.6–6.5)

## 2024-08-08 NOTE — Telephone Encounter (Signed)
 Patient notified of results and provider's recommendations. He was scheduled to return in 3 months for follow up

## 2024-08-08 NOTE — Telephone Encounter (Signed)
 Please advise pt that his A1C is showing that he now has diabetes. I would recommend that he work on limiting carbs/sugars in his diet and increasing exercise. I would like to see him back in 3 months for follow up.

## 2024-08-15 ENCOUNTER — Encounter: Payer: Self-pay | Admitting: Sleep Medicine

## 2024-08-15 ENCOUNTER — Ambulatory Visit

## 2024-08-15 ENCOUNTER — Ambulatory Visit: Admitting: Sleep Medicine

## 2024-08-15 VITALS — BP 124/78 | HR 63 | Temp 97.1°F | Ht 73.0 in | Wt 240.6 lb

## 2024-08-15 DIAGNOSIS — I10 Essential (primary) hypertension: Secondary | ICD-10-CM

## 2024-08-15 DIAGNOSIS — Z6831 Body mass index (BMI) 31.0-31.9, adult: Secondary | ICD-10-CM

## 2024-08-15 DIAGNOSIS — G4733 Obstructive sleep apnea (adult) (pediatric): Secondary | ICD-10-CM | POA: Diagnosis not present

## 2024-08-15 DIAGNOSIS — G4726 Circadian rhythm sleep disorder, shift work type: Secondary | ICD-10-CM

## 2024-08-15 DIAGNOSIS — E669 Obesity, unspecified: Secondary | ICD-10-CM

## 2024-08-15 NOTE — Patient Instructions (Signed)
 Darin Smith

## 2024-08-15 NOTE — Progress Notes (Signed)
 Name:Darin Smith MRN: [id number] DOB: January 05, 1971   CHIEF COMPLAINT:  EXCESSIVE DAYTIME SLEEPINESS   HISTORY OF PRESENT ILLNESS:  Mr. Quevedo is a 53 y.o. w/ a h/o HTN, obesity and GERD who present for c/o loud snoring and witnessed apnea which has been present for several years. Reports nocturnal awakenings due to nocturia, however does not have difficulty falling back to sleep. Reports a 25 lb weight gain over the last several years. Admits to dry mouth. Denies morning headaches, RLS symptoms, dream enactment, cataplexy, hypnagogic or hypnapompic hallucinations. Denies a family history of sleep apnea. Denies drowsy driving. Drinks soda occasionally, occasional alcohol use, smokes 2-3 cigars daily, denies illicit drug use. 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   Bedtime varies based on shifts Sleep onset 5 mins Rise time 11:30 am   EPWORTH SLEEP SCORE 5    08/15/2024    8:00 AM  Results of the Epworth flowsheet  Sitting and reading 1  Watching TV 3  Sitting, inactive in a public place (e.g. a theatre or a meeting) 0  As a passenger in a car for an hour without a break 0  Lying down to rest in the afternoon when circumstances permit 1  Sitting and talking to someone 0  In a car, while stopped for a few minutes in traffic 0     PAST MEDICAL HISTORY :   has a past medical history of Allergy, Appendiceal tumor (09/2019), Diverticulitis, GERD (gastroesophageal reflux disease), History of colon polyps, Hypertension, Ileus following gastrointestinal surgery (HCC) (10/21/2019), OAB (overactive bladder), Rupture spleen (age 53), and Seasonal allergies.  has a past surgical history that includes Splenectomy; Biopsy prostate (5/13); Colonoscopy (09/12/2019); and Laparoscopic partial colectomy (Right, 10/11/2019). Prior to Admission medications   Medication Sig Start Date End Date Taking? Authorizing Provider  amLODipine  (NORVASC ) 10  MG tablet Take 1 tablet (10 mg total) by mouth daily. 01/31/24  Yes Daryl Setter, NP  atorvastatin  (LIPITOR) 20 MG tablet Take 1 tablet (20 mg total) by mouth daily. 07/18/24  Yes O'Sullivan, Melissa, NP  loratadine  (CLARITIN ) 10 MG tablet Take 10 mg by mouth daily.   Yes [provider]  metoprolol  succinate (TOPROL -XL) 100 MG 24 hr tablet TAKE 1 TABLET BY MOUTH EVERY DAY WITH OR IMMEDIATELY FOLLOWING A MEAL 01/05/24  Yes Daryl Setter, NP  omeprazole  (PRILOSEC) 20 MG capsule Take 1 capsule (20 mg total) by mouth daily. 07/18/24  Yes O'Sullivan, Melissa, NP  valsartan  (DIOVAN ) 160 MG tablet Take 1 tablet (160 mg total) by mouth daily. 08/04/24  Yes O'Sullivan, Melissa, NP  Vibegron (GEMTESA) 75 MG TABS Take by mouth.   Yes [provider]  cefdinir  (OMNICEF ) 300 MG capsule Take 1 capsule (300 mg total) by mouth 2 (two) times daily. As needed for illness/fever Patient not taking: Reported on 08/15/2024 01/31/24   Daryl Setter, NP   No Known Allergies  FAMILY HISTORY:  family history includes COPD in his father; Hypertension in his father; Lupus in his mother; Multiple sclerosis in his sister; Obesity in his sister; Thyroid  disease in his sister; Vision loss in his mother. SOCIAL HISTORY:  reports that he has been smoking cigars. He has never used smokeless tobacco. He reports that he does not currently use alcohol after a past usage of about 1.0 standard drink of alcohol per week. He reports that he does not use drugs.   Review of Systems:  Gen:  Denies  fever, sweats, chills weight loss  HEENT: Denies blurred vision, double vision, ear pain, eye pain, hearing loss, nose bleeds, sore throat Cardiac:  No dizziness, chest pain or heaviness, chest tightness,edema, No JVD Resp:   No cough, -sputum production, -shortness of breath,-wheezing, -hemoptysis,  Gi: Denies swallowing difficulty, stomach pain, nausea or vomiting, diarrhea, constipation, bowel incontinence Gu:   Denies bladder incontinence, burning urine Ext:   Denies Joint pain, stiffness or swelling Skin: Denies  skin rash, easy bruising or bleeding or hives Endoc:  Denies polyuria, polydipsia , polyphagia or weight change Psych:   Denies depression, insomnia or hallucinations  Other:  All other systems negative  VITAL SIGNS: BP 124/78 (BP Location: Right Arm, Cuff Size: Large)   Pulse 63   Temp (!) 97.1 F (36.2 C)   Ht 6' 1 (1.854 m)   Wt 240 lb 9.6 oz (109.1 kg)   SpO2 96%   BMI 31.74 kg/m    Physical Examination:   General Appearance: No distress  EYES PERRLA, EOM intact.   NECK Supple, No JVD Pulmonary: normal breath sounds, No wheezing.  CardiovascularNormal S1,S2.  No m/r/g.   Abdomen: Benign, Soft, non-tender. Skin:   warm, no rashes, no ecchymosis  Extremities: normal, no cyanosis, clubbing. Neuro:without focal findings,  speech normal  PSYCHIATRIC: Mood, affect within normal limits.   ASSESSMENT AND PLAN  OSA I suspect that OSA is likely present due to clinical presentation. Discussed the consequences of untreated sleep apnea. Advised not to drive drowsy for safety of patient and others. Will complete further evaluation with a home sleep study and follow up to review results.    HTN Stable, on current management. Following with PCP.   Shift work sleep disorder Counseled patient keeping a regular sleep schedule on all days. Discussed the importance of sleeping 7-8 hours per night. Also advised not to drive drowsy for safety of patient and others.   Obesity Counseled patient on diet and lifestyle modification.    MEDICATION ADJUSTMENTS/LABS AND TESTS ORDERED: Recommend Sleep Study   Patient  satisfied with Plan of action and management. All questions answered  Follow up to review HST results and treatment plan.   I spent a total of 62 minutes reviewing chart data, face-to-face evaluation with the patient, counseling and coordination of care as detailed  above.    Virgene Tirone, M.D.  Sleep Medicine [facility]

## 2024-08-15 NOTE — Progress Notes (Signed)
 Pt here for Blood pressure check per Melissa O'Sullivan,FNP  Pt currently takes:  Norvasc  10 mg  Metoprolol  100 mg Valsartan  160 mg   Pt reports compliance with medication.  BP today @ = 140/82 right  Left- 142/90        HR =61   5 minute wait  110/80 ( right ) 122/78 (left)    Pt advised per follow up in 4 months with PCP

## 2024-08-27 ENCOUNTER — Other Ambulatory Visit: Payer: Self-pay | Admitting: Family

## 2024-08-27 DIAGNOSIS — I10 Essential (primary) hypertension: Secondary | ICD-10-CM

## 2024-08-30 ENCOUNTER — Other Ambulatory Visit: Payer: Self-pay | Admitting: Family

## 2024-09-03 ENCOUNTER — Encounter

## 2024-09-03 DIAGNOSIS — G4733 Obstructive sleep apnea (adult) (pediatric): Secondary | ICD-10-CM

## 2024-09-05 ENCOUNTER — Ambulatory Visit

## 2024-09-13 ENCOUNTER — Ambulatory Visit: Payer: Self-pay

## 2024-09-13 DIAGNOSIS — G4733 Obstructive sleep apnea (adult) (pediatric): Secondary | ICD-10-CM

## 2024-09-14 ENCOUNTER — Other Ambulatory Visit: Payer: Self-pay | Admitting: Family

## 2024-09-22 DIAGNOSIS — G4733 Obstructive sleep apnea (adult) (pediatric): Secondary | ICD-10-CM | POA: Diagnosis not present

## 2024-10-12 ENCOUNTER — Other Ambulatory Visit: Payer: Self-pay | Admitting: Family

## 2024-10-12 DIAGNOSIS — E785 Hyperlipidemia, unspecified: Secondary | ICD-10-CM

## 2024-10-13 ENCOUNTER — Other Ambulatory Visit: Payer: Self-pay

## 2024-10-16 ENCOUNTER — Ambulatory Visit: Admitting: Physician Assistant

## 2024-10-16 ENCOUNTER — Encounter: Payer: Self-pay | Admitting: Physician Assistant

## 2024-10-16 VITALS — BP 132/87 | HR 70

## 2024-10-16 DIAGNOSIS — L918 Other hypertrophic disorders of the skin: Secondary | ICD-10-CM | POA: Diagnosis not present

## 2024-10-16 NOTE — Patient Instructions (Signed)

## 2024-10-16 NOTE — Progress Notes (Signed)
   New Patient Visit   Subjective  Darin Smith is a 53 y.o. male NEW PATIENT who presents for the following: skin tag  Patient states he  has skin tag located at the inner right thigh that he  would like to have examined. Patient reports the areas have been there for 3 years. He reports the areas are bothersome due to the fact its rubs when he walks and when standings or sitting. He states that the areas have not spread. Patient reports he  has not previously been treated for these areas. Patient denies Hx of bx. Patient denies family history of skin tags.   The patient has spots, moles and lesions to be evaluated, some may be new or changing and the patient may have concern these could be cancer.   The following portions of the chart were reviewed this encounter and updated as appropriate: medications, allergies, medical history  Review of Systems:  No other skin or systemic complaints except as noted in HPI or Assessment and Plan.  Objective  Well appearing patient in no apparent distress; mood and affect are within normal limits.    A focused examination was performed of the following areas: Right inner thigh  Relevant exam findings are noted in the Assessment and Plan.       Right Medial Thigh 1.0 cm Right inner thigh sessile nodule   Assessment & Plan    INFLAMED SKIN TAG Right Medial Thigh Epidermal / dermal shaving - Right Medial Thigh  Lesion diameter (cm):  1 Informed consent: discussed and consent obtained   Timeout: patient name, date of birth, surgical site, and procedure verified   Procedure prep:  Patient was prepped and draped in usual sterile fashion Prep type:  Isopropyl alcohol Anesthesia: the lesion was anesthetized in a standard fashion   Anesthetic:  1% lidocaine  w/ epinephrine 1-100,000 buffered w/ 8.4% NaHCO3 Instrument used: flexible razor blade   Hemostasis achieved with: pressure, aluminum chloride and electrodesiccation   Outcome: patient  tolerated procedure well   Post-procedure details: sterile dressing applied and wound care instructions given   Dressing type: bandage and petrolatum    Specimen 1 - Surgical pathology Differential Diagnosis: Skin tag VS other  Check Margins: No  Return if symptoms worsen or fail to improve.  I, Doyce Pan, CMA, am acting as scribe for Darin Nolden K, PA-C.   Documentation: I have reviewed the above documentation for accuracy and completeness, and I agree with the above.  Darin Agent K, PA-C

## 2024-10-18 LAB — SURGICAL PATHOLOGY

## 2024-10-19 ENCOUNTER — Ambulatory Visit: Payer: Self-pay | Admitting: Physician Assistant

## 2024-10-23 ENCOUNTER — Encounter: Payer: Self-pay | Admitting: Sleep Medicine

## 2024-10-23 ENCOUNTER — Ambulatory Visit: Admitting: Sleep Medicine

## 2024-10-23 VITALS — BP 118/80 | HR 67 | Temp 98.0°F | Ht 73.0 in | Wt 242.2 lb

## 2024-10-23 DIAGNOSIS — G4733 Obstructive sleep apnea (adult) (pediatric): Secondary | ICD-10-CM

## 2024-10-23 DIAGNOSIS — F17210 Nicotine dependence, cigarettes, uncomplicated: Secondary | ICD-10-CM | POA: Diagnosis not present

## 2024-10-23 DIAGNOSIS — I10 Essential (primary) hypertension: Secondary | ICD-10-CM

## 2024-10-23 NOTE — Patient Instructions (Signed)
 Patient Instructions  Continue to use CPAP every night, minimum of 7-8 hours a night.  Change equipment every 30 days or as directed weekly, hang to dry. Wash humidifier portion weekly. Use distilled water and change daily   Be aware of reduced alertness and do not drive or operate heavy machinery if experiencing this or drowsiness.  Exercise encouraged, as tolerated. Encouraged proper weight management.  Important to get eight or more hours of sleep  Limiting the use of the computer and television before bedtime.  Decrease naps during the day, so night time sleep will become enhanced.  Limit caffeine, and sleep deprivation.  HTN, stroke, uncontrolled diabetes and heart failure are potential risk factors.  Risk of untreated sleep apnea including cardiac arrhthymias, stroke, DM, pulm HTN.

## 2024-10-23 NOTE — Progress Notes (Unsigned)
 Name:Darin Smith MRN: [id number] DOB: 12/25/1971   CHIEF COMPLAINT:  CPAP F/U   HISTORY OF PRESENT ILLNESS:  [name] is a 53 y.o. w/ a h/o OSA, HTN, obesity and GERD who presents for CPAP F/U. Reports using CPAP therapy every night, which is confirmed by compliance data. He is currently using the Airfit N30i nasal mask, which is comfortable. Reports air leaks.    EPWORTH SLEEP SCORE 5    08/15/2024    8:00 AM  Results of the Epworth flowsheet  Sitting and reading 1  Watching TV 3  Sitting, inactive in a public place (e.g. a theatre or a meeting) 0  As a passenger in a car for an hour without a break 0  Lying down to rest in the afternoon when circumstances permit 1  Sitting and talking to someone 0  In a car, while stopped for a few minutes in traffic 0    PAST MEDICAL HISTORY :   has a past medical history of Allergy, Appendiceal tumor (09/2019), Diverticulitis, GERD (gastroesophageal reflux disease), History of colon polyps, Hypertension, Ileus following gastrointestinal surgery (HCC) (10/21/2019), OAB (overactive bladder), Rupture spleen (age 41), and Seasonal allergies.  has a past surgical history that includes Splenectomy; Biopsy prostate (5/13); Colonoscopy (09/12/2019); and Laparoscopic partial colectomy (Right, 10/11/2019). Prior to Admission medications   Medication Sig Start Date End Date Taking? Authorizing Provider  amLODipine  (NORVASC ) 10 MG tablet Take 1 tablet (10 mg total) by mouth daily. 01/31/24  Yes Daryl Setter, NP  atorvastatin  (LIPITOR) 20 MG tablet Take 1 tablet (20 mg total) by mouth daily. 07/18/24  Yes O'Sullivan, Melissa, NP  loratadine  (CLARITIN ) 10 MG tablet Take 10 mg by mouth daily.   Yes [provider]  metoprolol  succinate (TOPROL -XL) 100 MG 24 hr tablet TAKE 1 TABLET BY MOUTH EVERY DAY WITH OR IMMEDIATELY FOLLOWING A MEAL 01/05/24  Yes Daryl Setter, NP  omeprazole  (PRILOSEC) 20 MG capsule Take 1 capsule (20 mg  total) by mouth daily. 07/18/24  Yes Daryl Setter, NP  valsartan  (DIOVAN ) 160 MG tablet Take 1 tablet (160 mg total) by mouth daily. 08/04/24  Yes O'Sullivan, Melissa, NP  Vibegron (GEMTESA) 75 MG TABS Take by mouth.   Yes [provider]  cefdinir  (OMNICEF ) 300 MG capsule Take 1 capsule (300 mg total) by mouth 2 (two) times daily. As needed for illness/fever Patient not taking: Reported on 08/15/2024 01/31/24   Daryl Setter, NP   No Known Allergies  FAMILY HISTORY:  family history includes COPD in his father; Hypertension in his father; Lupus in his mother; Multiple sclerosis in his sister; Obesity in his sister; Thyroid  disease in his sister; Vision loss in his mother. SOCIAL HISTORY:  reports that he has been smoking cigars. He has never used smokeless tobacco. He reports that he does not currently use alcohol after a past usage of about 1.0 standard drink of alcohol per week. He reports that he does not use drugs.   Review of Systems:  Gen:  Denies  fever, sweats, chills weight loss  HEENT: Denies blurred vision, double vision, ear pain, eye pain, hearing loss, nose bleeds, sore throat Cardiac:  No dizziness, chest pain or heaviness, chest tightness,edema, No JVD Resp:   No cough, -sputum production, -shortness of breath,-wheezing, -hemoptysis,  Gi: Denies swallowing difficulty, stomach pain, nausea or vomiting, diarrhea, constipation, bowel incontinence Gu:  Denies bladder incontinence, burning urine Ext:   Denies Joint pain, stiffness or swelling Skin: Denies  skin rash, easy bruising or bleeding or hives Endoc:  Denies polyuria, polydipsia , polyphagia or weight change Psych:   Denies depression, insomnia or hallucinations  Other:  All other systems negative  VITAL SIGNS: BP 118/80   Pulse 67   Temp 98 F (36.7 C)   Ht 6' 1 (1.854 m)   Wt 242 lb 3.2 oz (109.9 kg)   SpO2 96%   BMI 31.95 kg/m    Physical Examination:   General Appearance: No distress   EYES PERRLA, EOM intact.   NECK Supple, No JVD Pulmonary: normal breath sounds, No wheezing.  CardiovascularNormal S1,S2.  No m/r/g.   Abdomen: Benign, Soft, non-tender. Skin:   warm, no rashes, no ecchymosis  Extremities: normal, no cyanosis, clubbing. Neuro:without focal findings,  speech normal  PSYCHIATRIC: Mood, affect within normal limits.   ASSESSMENT AND PLAN  OSA Patient is using and benefiting from CPAP therapy. Discussed the consequences of untreated sleep apnea. Advised not to drive drowsy for safety of patient and others. Will follow up in 3 months.    HTN Stable, on current management. Following with PCP.    Patient  satisfied with Plan of action and management. All questions answered  I spent a total of 24 minutes reviewing chart data, face-to-face evaluation with the patient, counseling and coordination of care as detailed above.    Nicklos Gaxiola, M.D.  Sleep Medicine [facility]

## 2024-11-06 ENCOUNTER — Telehealth: Payer: Self-pay

## 2024-11-06 DIAGNOSIS — G4733 Obstructive sleep apnea (adult) (pediatric): Secondary | ICD-10-CM

## 2024-11-06 NOTE — Telephone Encounter (Signed)
 Copied from CRM [id number]. Topic: Clinical - Order For Equipment >> Nov 06, 2024 11:15 AM Celestine FALCON wrote: Reason for CRM: Pt stated he contracted DME company [facility] for cpap supplies, but they needed a prescription to be sent to them via fax by Dr. Jess at [phone] phone number [phone] for the mask he was changed to. The new mask is for [employer] x 30i.   Pt's phone number is [phone] ok to leave a vm.

## 2024-11-06 NOTE — Telephone Encounter (Signed)
 Lm with Oneil from [facility] to confirm that they need an order for a mask change.

## 2024-11-07 NOTE — Telephone Encounter (Signed)
 I did not get a response from [facility].   Dr. Jess- okay to place order for [employer] X30I mask?

## 2024-11-09 NOTE — Telephone Encounter (Signed)
 Order has been placed.  I have notified the patient.  Nothing further needed.

## 2024-11-14 ENCOUNTER — Other Ambulatory Visit (HOSPITAL_BASED_OUTPATIENT_CLINIC_OR_DEPARTMENT_OTHER): Payer: Self-pay

## 2024-11-14 ENCOUNTER — Encounter: Payer: Self-pay | Admitting: Family

## 2024-11-14 ENCOUNTER — Ambulatory Visit: Admitting: Family

## 2024-11-14 VITALS — BP 133/72 | HR 71 | Temp 98.4°F | Resp 16 | Ht 73.0 in | Wt 243.0 lb

## 2024-11-14 DIAGNOSIS — E1165 Type 2 diabetes mellitus with hyperglycemia: Secondary | ICD-10-CM | POA: Diagnosis not present

## 2024-11-14 DIAGNOSIS — Z9081 Acquired absence of spleen: Secondary | ICD-10-CM

## 2024-11-14 DIAGNOSIS — Z23 Encounter for immunization: Secondary | ICD-10-CM | POA: Diagnosis not present

## 2024-11-14 DIAGNOSIS — I10 Essential (primary) hypertension: Secondary | ICD-10-CM | POA: Diagnosis not present

## 2024-11-14 DIAGNOSIS — K219 Gastro-esophageal reflux disease without esophagitis: Secondary | ICD-10-CM

## 2024-11-14 DIAGNOSIS — E785 Hyperlipidemia, unspecified: Secondary | ICD-10-CM | POA: Diagnosis not present

## 2024-11-14 DIAGNOSIS — R972 Elevated prostate specific antigen [PSA]: Secondary | ICD-10-CM

## 2024-11-14 DIAGNOSIS — N3281 Overactive bladder: Secondary | ICD-10-CM

## 2024-11-14 LAB — COMPREHENSIVE METABOLIC PANEL WITH GFR
ALT: 28 U/L (ref 0–53)
AST: 22 U/L (ref 0–37)
Albumin: 4.5 g/dL (ref 3.5–5.2)
Alkaline Phosphatase: 74 U/L (ref 39–117)
BUN: 10 mg/dL (ref 6–23)
CO2: 30 meq/L (ref 19–32)
Calcium: 9.8 mg/dL (ref 8.4–10.5)
Chloride: 104 meq/L (ref 96–112)
Creatinine, Ser: 1.24 mg/dL (ref 0.40–1.50)
GFR: 66.57 mL/min (ref >60.00)
Glucose, Bld: 100 mg/dL — ABNORMAL HIGH (ref 70–99)
Potassium: 4.7 meq/L (ref 3.5–5.1)
Sodium: 141 meq/L (ref 135–145)
Total Bilirubin: 0.5 mg/dL (ref 0.2–1.2)
Total Protein: 7.2 g/dL (ref 6.0–8.3)

## 2024-11-14 LAB — MICROALBUMIN / CREATININE URINE RATIO
Creatinine,U: 166.3 mg/dL
Microalb Creat Ratio: UNDETERMINED mg/g (ref 0.0–30.0)
Microalb, Ur: <0.7 mg/dL

## 2024-11-14 LAB — HEMOGLOBIN A1C: Hgb A1c MFr Bld: 6.8 % — ABNORMAL HIGH (ref 4.6–6.5)

## 2024-11-14 MED ORDER — ATORVASTATIN CALCIUM 20 MG PO TABS: 20.0000 mg | ORAL_TABLET | Freq: Every day | ORAL | 0 refills | Status: AC

## 2024-11-14 MED ORDER — CEFDINIR 300 MG PO CAPS: 300.0000 mg | ORAL_CAPSULE | Freq: Two times a day (BID) | ORAL | 0 refills | Status: DC

## 2024-11-14 MED ORDER — OMEPRAZOLE 20 MG PO CPDR
20.0000 mg | DELAYED_RELEASE_CAPSULE | Freq: Every day | ORAL | 1 refills | Status: AC
Start: 2024-11-14 — End: ?

## 2024-11-14 MED ORDER — AMLODIPINE BESYLATE 10 MG PO TABS: 10.0000 mg | ORAL_TABLET | Freq: Every day | ORAL | 1 refills | Status: AC

## 2024-11-14 MED ORDER — COVID-19 MRNA VAC-TRIS(PFIZER) 30 MCG/0.3ML IM SUSY
0.3000 mL | PREFILLED_SYRINGE | Freq: Once | INTRAMUSCULAR | 0 refills | Status: AC
  Filled 2024-11-14: qty 0.3, 1d supply, fill #0

## 2024-11-14 NOTE — Assessment & Plan Note (Signed)
 Follows with Urology every 6 months.

## 2024-11-14 NOTE — Patient Instructions (Signed)
  VISIT SUMMARY: Today, you came in for a follow-up on your medications and lab results. We discussed your current health status, including your hypertension, diabetes, cholesterol, acid reflux, sleep apnea, and prostate health. You also received some vaccinations and we talked about the importance of continuing your current treatments and lifestyle changes.  YOUR PLAN: -HYPERTENSION: Your blood pressure is well-controlled with your current medications. Hypertension means high blood pressure, which can lead to serious health problems if not managed. Continue taking Amlodipine , Metoprolol , and Valsartan  as prescribed.  -TYPE 2 DIABETES MELLITUS WITH HYPERGLYCEMIA: Your previous A1c was 7.6%, indicating that your blood sugar levels have been higher than normal. Diabetes is a condition where your body has trouble managing blood sugar. We ordered an A1c test today to check your current levels, performed an annual diabetic foot exam, and ordered a urine test to check for protein.  -HYPERLIPIDEMIA: Your cholesterol levels are well-controlled with atorvastatin . Hyperlipidemia means you have high levels of fats in your blood, which can increase your risk of heart disease. Continue taking atorvastatin  20 mg daily.  -GASTROESOPHAGEAL REFLUX DISEASE: Your acid reflux symptoms are managed with omeprazole . This condition causes stomach acid to flow back into your esophagus, leading to discomfort. Continue taking omeprazole  20 mg daily.  -OBSTRUCTIVE SLEEP APNEA, ON CPAP: Your sleep apnea is being managed with CPAP therapy, which has improved your sleep quality and energy levels. Sleep apnea is a condition where your breathing stops and starts during sleep. Continue using your CPAP machine.  -ELEVATED PROSTATE SPECIFIC ANTIGEN, UNDER UROLOGY SURVEILLANCE: Your PSA levels have been fluctuating, and you are being monitored by urology every six months. Elevated PSA can be a sign of prostate issues. Continue with your  biannual PSA monitoring with urology.  -GENERAL HEALTH MAINTENANCE: You were due for tetanus and hepatitis B vaccinations. Vaccinations help protect you from serious diseases. You received the tetanus vaccine (TDAP) and the first dose of the hepatitis B vaccine series today. We scheduled a nurse visit for the second dose of the hepatitis B vaccine in one month. We also recommended getting a COVID booster at a pharmacy.  INSTRUCTIONS: Please follow up with the nurse in one month for your second dose of the hepatitis B vaccine. Additionally, get your COVID booster at a pharmacy. Continue with your current medications and lifestyle changes, and follow up with urology as scheduled.

## 2024-11-14 NOTE — Assessment & Plan Note (Addendum)
 Lab Results  Component Value Date   HGBA1C 7.6 (H) 08/07/2024   HGBA1C 5.6 10/09/2019   Lab Results  Component Value Date   LDLCALC 67 03/20/2024   CREATININE 1.11 08/04/2024   This is a new diagnosis for him.  He has really decreased his sugar intake since last visit.  Will update A1C.

## 2024-11-14 NOTE — Assessment & Plan Note (Signed)
 Recommended that he get the Covid booster at his pharmacy. Refilled omnicef  for him to have on hand in case of illness.

## 2024-11-14 NOTE — Assessment & Plan Note (Signed)
 Stable on omeprazole - has not tolerated coming off of it.

## 2024-11-14 NOTE — Progress Notes (Signed)
 Subjective:     Patient ID: Darin Smith, male    DOB: February 16, 1971, 53 y.o.   MRN: [id number]  Chief Complaint  Patient presents with   Diabetes    Here for follow up   Hypertension    Her for follow up    Diabetes  Hypertension    Discussed the use of AI scribe software for clinical note transcription with the patient, who gave verbal consent to proceed.  History of Present Illness [name] is a 53 year old male with hypertension and diabetes who presents for a follow-up on medications and labs.  He has been using a CPAP machine after a home sleep study indicated severe sleep apnea. Initially, he struggled with finding the right mask but now feels significantly better, noting increased energy and no longer needing naps during the day.  His blood pressure is well-controlled with amlodipine , metoprolol , and valsartan , with recent readings as low as 113/72 mmHg. He took his medication before the visit and notes that his blood pressure was 133/72 mmHg today.  He has made dietary changes, including stopping soda consumption and increasing water intake. He occasionally drinks sweet tea but is trying to reduce sugar by using Splenda. He is also eating more fruits and salads, while being mindful of sugar content in foods.  He is on atorvastatin  for cholesterol management and reports good cholesterol levels. He is also taking omeprazole  daily and notes that if he misses a dose, he experiences symptoms by midday.  He has a history of fluctuating PSA levels and is currently monitored by urology every six months. He takes Cialis and Gemtesa as prescribed by urology.  He mentions needing a refill on antibiotics and atorvastatin , and he has enough valsartan  and metoprolol  until the end of March. He also needs a refill on amlodipine  and omeprazole .   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 Oliva Oiler- negative   COLONOSCOPY  09/12/2019   LAPAROSCOPIC PARTIAL COLECTOMY Right 10/11/2019   Procedure: LAPAROSCOPIC RIGHT HEMICOLECTOMY;  Surgeon: Teresa Lonni HERO, MD;  Location: [facility];  Service: General;  Laterality: Right;   SPLENECTOMY      Family History  Problem Relation Age of Onset   Lupus Mother    Vision loss Mother        in left eye   Hypertension Father    COPD Father    Obesity Sister    Thyroid  disease Sister    Multiple sclerosis Sister    Cancer Neg Hx        prostate   Colon cancer Neg Hx    Colon polyps  Neg Hx    Esophageal cancer Neg Hx    Rectal cancer Neg Hx    Stomach cancer Neg Hx     Social History   Socioeconomic History   Marital status: Married    Spouse name: Not on file   Number of children: Not on file   Years of education: Not on file   Highest education level: 12th grade  Occupational History   Not on file  Tobacco Use   Smoking status: Some Days    Types: Cigars   Smokeless tobacco: Never   Tobacco comments:    Occasional cigars  Vaping Use   Vaping status: Never Used  Substance and Sexual Activity   Alcohol use: Not Currently    Alcohol/week: 1.0 standard drink of alcohol    Types: 1 Shots of liquor per week    Comment: daily   Drug use: No   Sexual activity:  Yes    Partners: Female  Other Topics Concern   Not on file  Social History Narrative   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 They live in [place]   Remarried 7/15   Social Drivers of Health   Financial Resource Strain: Low Risk  (11/13/2024)   Overall Financial Resource Strain (CARDIA)    Difficulty of Paying Living Expenses: Not hard at all  Food Insecurity: No Food Insecurity (11/13/2024)   Hunger Vital Sign    Worried About Running Out of Food in the Last Year: Never true    Ran Out of Food in the Last Year: Never true  Transportation Needs: No Transportation Needs (11/13/2024)   PRAPARE - Administrator, Civil Service (Medical): No    Lack of Transportation (Non-Medical): No  Physical Activity: Inactive (11/13/2024)   Exercise Vital Sign    Days of Exercise per Week: 0 days    Minutes of Exercise per Session: Not on file  Stress: No Stress Concern Present (11/13/2024)   [employer] of Occupational Health - Occupational Stress Questionnaire    Feeling of Stress: Not at all  Social Connections: Socially Integrated (11/13/2024)   Social Connection and Isolation Panel    Frequency of Communication with Friends and Family: More than three times a week    Frequency of Social Gatherings with Friends and Family: Once a week    Attends Religious Services: 1 to 4 times per year    Active Member of [employer] or Organizations: Yes    Attends Banker Meetings: 1 to 4 times per year    Marital Status: Married  Catering Manager Violence: Not on file    Outpatient Medications Prior to Visit  Medication Sig Dispense Refill   loratadine  (CLARITIN ) 10 MG tablet Take 10 mg by mouth daily.     metoprolol  succinate (TOPROL -XL) 100 MG 24 hr tablet Take 1 tablet (100 mg total) by mouth daily. Take with or immediately following a meal 90 tablet 1   tadalafil (CIALIS) 5 MG tablet Take  5 mg by mouth daily.     valsartan  (DIOVAN ) 160 MG tablet TAKE 1 TABLET BY MOUTH EVERY DAY 90 tablet 1   Vibegron (GEMTESA) 75 MG TABS Take by mouth.     amLODipine  (NORVASC ) 10 MG tablet TAKE 1 TABLET BY MOUTH EVERY DAY 90 tablet 0   atorvastatin  (LIPITOR) 20 MG tablet Take 1 tablet (20 mg  total) by mouth daily. 90 tablet 0   cefdinir  (OMNICEF ) 300 MG capsule Take 1 capsule (300 mg total) by mouth 2 (two) times daily. As needed for illness/fever 14 capsule 0   omeprazole  (PRILOSEC) 20 MG capsule Take 1 capsule (20 mg total) by mouth daily. 90 capsule 0   No facility-administered medications prior to visit.    No Known Allergies  ROS See HPI    Objective:    Physical Exam Constitutional:      General: He is not in acute distress.    Appearance: He is well-developed.  HENT:     Head: Normocephalic and atraumatic.  Cardiovascular:     Rate and Rhythm: Normal rate and regular rhythm.     Heart sounds: No murmur heard. Pulmonary:     Effort: Pulmonary effort is normal. No respiratory distress.     Breath sounds: Normal breath sounds. No wheezing or rales.  Skin:    General: Skin is warm and dry.  Neurological:     Mental Status: He is alert and oriented to person, place, and time.  Psychiatric:        Behavior: Behavior normal.        Thought Content: Thought content normal.    Diabetic Foot Exam - Simple   Simple Foot Form Diabetic Foot exam was performed with the following findings: Yes 11/14/2024 10:23 AM  Visual Inspection No deformities, no ulcerations, no other skin breakdown bilaterally: Yes Sensation Testing Intact to touch and monofilament testing bilaterally: Yes Pulse Check Posterior Tibialis and Dorsalis pulse intact bilaterally: Yes Comments       BP 133/72 (BP Location: Right Arm, Patient Position: Sitting, Cuff Size: Large)   Pulse 71   Temp 98.4 F (36.9 C) (Oral)   Resp 16   Ht 6' 1 (1.854 m)   Wt 243 lb (110.2 kg)   SpO2 100%   BMI 32.06  kg/m  Wt Readings from Last 3 Encounters:  11/14/24 243 lb (110.2 kg)  10/23/24 242 lb 3.2 oz (109.9 kg)  08/15/24 240 lb 9.6 oz (109.1 kg)       Assessment & Plan:   Problem List Items Addressed This Visit       Unprioritized   Uncontrolled type 2 diabetes mellitus with hyperglycemia (HCC)   Lab Results  Component Value Date   HGBA1C 7.6 (H) 08/07/2024   HGBA1C 5.6 10/09/2019   Lab Results  Component Value Date   LDLCALC 67 03/20/2024   CREATININE 1.11 08/04/2024   This is a new diagnosis for him.  He has really decreased his sugar intake since last visit.  Will update A1C.       Relevant Medications   atorvastatin  (LIPITOR) 20 MG tablet   Other Relevant Orders   HgB A1c   Urine Microalbumin w/creat. ratio   Comp Met (CMET)   OAB (overactive bladder)   Stable on Gemtesa per Urology.       Hyperlipidemia   Lab Results  Component Value Date   CHOL 127 03/20/2024   HDL 34.50 (L) 03/20/2024   LDLCALC 67 03/20/2024   LDLDIRECT 108.0 11/03/2019   TRIG 128.0 03/20/2024   CHOLHDL 4 03/20/2024   Last LDL at goal.  Continues lipitor.       Relevant Medications   atorvastatin  (LIPITOR) 20 MG tablet   amLODipine  (NORVASC ) 10 MG tablet   HTN (hypertension) - Primary   BP stable on amlodipine , metoprolol , diovan . BP Readings from Last 3 Encounters:  11/14/24 133/72  10/23/24 118/80  10/16/24 132/87         Relevant Medications   atorvastatin  (LIPITOR) 20 MG tablet   amLODipine  (NORVASC ) 10 MG tablet   H/O splenectomy   Recommended that he get the Covid booster at his pharmacy. Refilled omnicef  for him to have on hand in case of illness.       Relevant Medications   cefdinir  (OMNICEF ) 300 MG capsule   GERD (gastroesophageal reflux disease)   Stable on omeprazole - has not tolerated coming off of it.       Relevant Medications   omeprazole  (PRILOSEC) 20 MG capsule   Elevated PSA   Follows with Urology every 6 months.        I have changed Gurkaran  L. Lame Lamont's amLODipine . I am also having him maintain his loratadine , Gemtesa, valsartan , metoprolol  succinate, tadalafil, cefdinir , atorvastatin , and omeprazole .  Meds ordered this encounter  Medications   cefdinir  (OMNICEF ) 300 MG capsule    Sig: Take 1 capsule (300 mg total) by mouth 2 (two) times daily. As needed for illness/fever    Dispense:  14 capsule    Refill:  0    Supervising Provider:   DOMENICA BLACKBIRD A [[id number]]   atorvastatin  (LIPITOR) 20 MG tablet    Sig: Take 1 tablet (20 mg total) by mouth daily.    Dispense:  90 tablet    Refill:  0    Supervising Provider:   DOMENICA BLACKBIRD A [[id number]]   amLODipine  (NORVASC ) 10 MG tablet    Sig: Take 1 tablet (10 mg total) by mouth daily.    Dispense:  90 tablet    Refill:  1    Supervising Provider:   DOMENICA BLACKBIRD A [[id number]]   omeprazole  (PRILOSEC) 20 MG capsule    Sig: Take 1 capsule (20 mg total) by mouth daily.    Dispense:  90 capsule    Refill:  1    Supervising Provider:   DOMENICA BLACKBIRD A [[id number]]

## 2024-11-14 NOTE — Progress Notes (Signed)
 Subjective:     Patient ID: Darin Smith, male    DOB: 16-Nov-1971, 53 y.o.   MRN: [id number]  Chief Complaint  Patient presents with   Diabetes    Here for follow up   Hypertension    Her for follow up    HPI  Discussed the use of AI scribe software for clinical note transcription with the patient, who gave verbal consent to proceed.  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 Oliva Oiler- negative   COLONOSCOPY  09/12/2019   LAPAROSCOPIC PARTIAL COLECTOMY Right 10/11/2019   Procedure: LAPAROSCOPIC RIGHT HEMICOLECTOMY;  Surgeon: Teresa Lonni HERO, MD;  Location: [facility];  Service: General;  Laterality: Right;   SPLENECTOMY      Family History  Problem Relation Age of Onset   Lupus Mother    Vision loss Mother        in left eye   Hypertension Father    COPD Father    Obesity Sister    Thyroid  disease Sister    Multiple sclerosis Sister    Cancer Neg Hx        prostate   Colon cancer Neg Hx    Colon polyps Neg Hx    Esophageal cancer Neg Hx    Rectal cancer Neg Hx    Stomach cancer Neg Hx     Social History   Socioeconomic History   Marital status: Married    Spouse name: Not on file   Number of  children: Not on file   Years of education: Not on file   Highest education level: 12th grade  Occupational History   Not on file  Tobacco Use   Smoking status: Some Days    Types: Cigars   Smokeless tobacco: Never   Tobacco comments:    Occasional cigars  Vaping Use   Vaping status: Never Used  Substance and Sexual Activity   Alcohol use: Not Currently    Alcohol/week: 1.0 standard drink of alcohol    Types: 1 Shots of liquor per week    Comment: daily   Drug use: No   Sexual activity: Yes    Partners: Female  Other Topics Concern   Not on file  Social History Narrative   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 They live in [place]   Remarried 7/15   Social Drivers of Health   Financial Resource Strain: Low Risk  (11/13/2024)  Overall Financial Resource Strain (CARDIA)    Difficulty of Paying Living Expenses: Not hard at all  Food Insecurity: No Food Insecurity (11/13/2024)   Hunger Vital Sign    Worried About Running Out of Food in the Last Year: Never true    Ran Out of Food in the Last Year: Never true  Transportation Needs: No Transportation Needs (11/13/2024)   PRAPARE - Administrator, Civil Service (Medical): No    Lack of Transportation (Non-Medical): No  Physical Activity: Inactive (11/13/2024)   Exercise Vital Sign    Days of Exercise per Week: 0 days    Minutes of Exercise per Session: Not on file  Stress: No Stress Concern Present (11/13/2024)   [employer] of Occupational Health - Occupational Stress Questionnaire    Feeling of Stress: Not at all  Social Connections: Socially Integrated (11/13/2024)   Social Connection and Isolation Panel    Frequency of Communication with Friends and Family: More than three times a week    Frequency of Social Gatherings with Friends and Family: Once a week    Attends Religious Services: 1 to 4 times per year    Active  Member of [employer] or Organizations: Yes    Attends Banker Meetings: 1 to 4 times per year    Marital Status: Married  Catering Manager Violence: Not on file    Outpatient Medications Prior to Visit  Medication Sig Dispense Refill   amLODipine  (NORVASC ) 10 MG tablet TAKE 1 TABLET BY MOUTH EVERY DAY 90 tablet 0   atorvastatin  (LIPITOR) 20 MG tablet Take 1 tablet (20 mg total) by mouth daily. 90 tablet 0   cefdinir  (OMNICEF ) 300 MG capsule Take 1 capsule (300 mg total) by mouth 2 (two) times daily. As needed for illness/fever 14 capsule 0   loratadine  (CLARITIN ) 10 MG tablet Take 10 mg by mouth daily.     metoprolol  succinate (TOPROL -XL) 100 MG 24 hr tablet Take 1 tablet (100 mg total) by mouth daily. Take with or immediately following a meal 90 tablet 1   omeprazole  (PRILOSEC) 20 MG capsule Take 1 capsule (20 mg total) by mouth daily. 90 capsule 0   tadalafil (CIALIS) 5 MG tablet Take 5 mg by mouth daily.     valsartan  (DIOVAN ) 160 MG tablet TAKE 1 TABLET BY MOUTH EVERY DAY 90 tablet 1   Vibegron (GEMTESA) 75 MG TABS Take by mouth.     No facility-administered medications prior to visit.    No Known Allergies  ROS     Objective:    Physical Exam   BP 133/72 (BP Location: Right Arm, Patient Position: Sitting, Cuff Size: Large)   Pulse 71   Temp 98.4 F (36.9 C) (Oral)   Resp 16   Ht 6' 1 (1.854 m)   Wt 243 lb (110.2 kg)   SpO2 100%   BMI 32.06 kg/m  Wt Readings from Last 3 Encounters:  11/14/24 243 lb (110.2 kg)  10/23/24 242 lb 3.2 oz (109.9 kg)  08/15/24 240 lb 9.6 oz (109.1 kg)       Assessment & Plan:   Problem List Items Addressed This Visit   None   I am having Darin Smith maintain his loratadine , Gemtesa, cefdinir , valsartan , amLODipine , metoprolol  succinate, atorvastatin , omeprazole , and tadalafil.  No orders of the defined types were placed in this encounter.

## 2024-11-14 NOTE — Addendum Note (Signed)
 Addended by: WELLS LEVORN HERO on: 11/14/2024 10:54 AM   Modules accepted: Orders

## 2024-11-14 NOTE — Assessment & Plan Note (Signed)
 Lab Results  Component Value Date   CHOL 127 03/20/2024   HDL 34.50 (L) 03/20/2024   LDLCALC 67 03/20/2024   LDLDIRECT 108.0 11/03/2019   TRIG 128.0 03/20/2024   CHOLHDL 4 03/20/2024   Last LDL at goal.  Continues lipitor.

## 2024-11-14 NOTE — Assessment & Plan Note (Signed)
 BP stable on amlodipine , metoprolol , diovan . BP Readings from Last 3 Encounters:  11/14/24 133/72  10/23/24 118/80  10/16/24 132/87

## 2024-11-14 NOTE — Assessment & Plan Note (Signed)
 Stable on Gemtesa per Urology.

## 2024-11-15 ENCOUNTER — Ambulatory Visit: Payer: Self-pay | Admitting: Family

## 2024-11-26 ENCOUNTER — Telehealth: Admitting: Family

## 2024-11-26 DIAGNOSIS — M109 Gout, unspecified: Secondary | ICD-10-CM | POA: Diagnosis not present

## 2024-11-26 MED ORDER — COLCHICINE 0.6 MG PO TABS: ORAL_TABLET | ORAL | 0 refills | Status: DC

## 2024-11-26 MED ORDER — PREDNISONE 20 MG PO TABS: 40.0000 mg | ORAL_TABLET | Freq: Every day | ORAL | 0 refills | Status: AC

## 2024-11-26 NOTE — Patient Instructions (Signed)
Gout  Gout is a condition that causes painful swelling of the joints. Gout is a type of inflammation of the joints (arthritis). This condition is caused by having too much uric acid in the body. Uric acid is a chemical that forms when the body breaks down substances called purines. Purines are important for building body proteins. When the body has too much uric acid, sharp crystals can form and build up inside the joints. This causes pain and swelling. Gout attacks can happen quickly and may be very painful (acute gout). Over time, the attacks can affect more joints and become more frequent (chronic gout). Gout can also cause uric acid to build up under the skin and inside the kidneys. What are the causes? This condition is caused by too much uric acid in your blood. This can happen because: Your kidneys do not remove enough uric acid from your blood. This is the most common cause. Your body makes too much uric acid. This can happen with some cancers and cancer treatments. It can also occur if your body is breaking down too many red blood cells (hemolytic anemia). You eat too many foods that are high in purines. These foods include organ meats and some seafood. Alcohol, especially beer, is also high in purines. A gout attack may be triggered by trauma or stress. What increases the risk? The following factors may make you more likely to develop this condition: Having a family history of gout. Being male and middle-aged. Being male and having gone through menopause. Taking certain medicines, including aspirin, cyclosporine, diuretics, levodopa, and niacin. Having an organ transplant. Having certain conditions, such as: Being obese. Lead poisoning. Kidney disease. A skin condition called psoriasis. Other factors include: Losing weight too quickly. Being dehydrated. Frequently drinking alcohol, especially beer. Frequently drinking beverages that are sweetened with a type of sugar called  fructose. What are the signs or symptoms? An attack of acute gout happens quickly. It usually occurs in just one joint. The most common place is the big toe. Attacks often start at night. Other joints that may be affected include joints of the feet, ankle, knee, fingers, wrist, or elbow. Symptoms of this condition may include: Severe pain. Warmth. Swelling. Stiffness. Tenderness. The affected joint may be very painful to touch. Shiny, red, or purple skin. Chills and fever. Chronic gout may cause symptoms more frequently. More joints may be involved. You may also have white or yellow lumps (tophi) on your hands or feet or in other areas near your joints. How is this diagnosed? This condition is diagnosed based on your symptoms, your medical history, and a physical exam. You may have tests, such as: Blood tests to measure uric acid levels. Removal of joint fluid with a thin needle (aspiration) to look for uric acid crystals. X-rays to look for joint damage. How is this treated? Treatment for this condition has two phases: treating an acute attack and preventing future attacks. Acute gout treatment may include medicines to reduce pain and swelling, including: NSAIDs, such as ibuprofen. Steroids. These are strong anti-inflammatory medicines that can be taken by mouth (orally) or injected into a joint. Colchicine. This medicine relieves pain and swelling when it is taken soon after an attack. It can be given by mouth or through an IV. Preventive treatment may include: Daily use of smaller doses of NSAIDs or colchicine. Use of a medicine that reduces uric acid levels in your blood, such as allopurinol. Changes to your diet. You may need to see   a dietitian about what to eat and drink to prevent gout. Follow these instructions at home: During a gout attack  If directed, put ice on the affected area. To do this: Put ice in a plastic bag. Place a towel between your skin and the bag. Leave the  ice on for 20 minutes, 2-3 times a day. Remove the ice if your skin turns bright red. This is very important. If you cannot feel pain, heat, or cold, you have a greater risk of damage to the area. Raise (elevate) the affected joint above the level of your heart as often as possible. Rest the joint as much as possible. If the affected joint is in your leg, you may be given crutches to use. Follow instructions from your health care provider about eating or drinking restrictions. Avoiding future gout attacks Follow a low-purine diet as told by your dietitian or health care provider. Avoid foods and drinks that are high in purines, including liver, kidney, anchovies, asparagus, herring, mushrooms, mussels, and beer. Maintain a healthy weight or lose weight if you are overweight. If you want to lose weight, talk with your health care provider. Do not lose weight too quickly. Start or maintain an exercise program as told by your health care provider. Eating and drinking Avoid drinking beverages that contain fructose. Drink enough fluids to keep your urine pale yellow. If you drink alcohol: Limit how much you have to: 0-1 drink a day for women who are not pregnant. 0-2 drinks a day for men. Know how much alcohol is in a drink. In the [place] one drink equals one 12 oz bottle of beer (355 mL), one 5 oz glass of wine (148 mL), or one 1 oz glass of hard liquor (44 mL). General instructions Take over-the-counter and prescription medicines only as told by your health care provider. Ask your health care provider if the medicine prescribed to you requires you to avoid driving or using machinery. Return to your normal activities as told by your health care provider. Ask your health care provider what activities are safe for you. Keep all follow-up visits. This is important. Where to find more information National Institutes of Health: www.niams.nih.gov Contact a health care provider if you have: Another  gout attack. Continuing symptoms of a gout attack after 10 days of treatment. Side effects from your medicines. Chills or a fever. Burning pain when you urinate. Pain in your lower back or abdomen. Get help right away if you: Have severe or uncontrolled pain. Cannot urinate. Summary Gout is painful swelling of the joints caused by having too much uric acid in the body. The most common site for gout to occur is in the big toe, but it can affect other joints in the body. Medicines and dietary changes can help to prevent and treat gout attacks. This information is not intended to replace advice given to you by your health care provider. Make sure you discuss any questions you have with your health care provider. Document Revised: 09/17/2021 Document Reviewed: 09/17/2021 Elsevier Patient Education  2024 [employer]  

## 2024-11-26 NOTE — Progress Notes (Signed)
 Virtual Visit Consent   Darin Smith, you are scheduled for a virtual visit with a [facility] provider today. Just as with appointments in the office, your consent must be obtained to participate. Your consent will be active for this visit and any virtual visit you may have with one of our providers in the next 365 days. If you have a MyChart account, a copy of this consent can be sent to you electronically.  As this is a virtual visit, video technology does not allow for your provider to perform a traditional examination. This may limit your provider's ability to fully assess your condition. If your provider identifies any concerns that need to be evaluated in person or the need to arrange testing (such as labs, EKG, etc.), we will make arrangements to do so. Although advances in technology are sophisticated, we cannot ensure that it will always work on either your end or our end. If the connection with a video visit is poor, the visit may have to be switched to a telephone visit. With either a video or telephone visit, we are not always able to ensure that we have a secure connection.  By engaging in this virtual visit, you consent to the provision of healthcare and authorize for your insurance to be billed (if applicable) for the services provided during this visit. Depending on your insurance coverage, you may receive a charge related to this service.  I need to obtain your verbal consent now. Are you willing to proceed with your visit today? Darin Smith has provided verbal consent on 11/26/2024 for a virtual visit (video or telephone). Bari Learn, FNP  Date: 11/26/2024 6:33 PM   Virtual Visit via Video Note   I, Bari Learn, connected with  Darin Smith  ([id number], 11/13/1971) on 11/26/24 at  6:30 PM EST by a video-enabled telemedicine application and verified that I am speaking with the correct person using two identifiers.  Location: Patient: Virtual Visit Location Patient:  Home Provider: Virtual Visit Location Provider: Home Office   I discussed the limitations of evaluation and management by telemedicine and the availability of in person appointments. The patient expressed understanding and agreed to proceed.    History of Present Illness: Darin Smith is a 53 y.o. who identifies as a male who was assigned male at birth, and is being seen today for right great toe pain that started 2-3 days ago. Reports erythemas, swollen, warmth, and tenderness. Reports aching pain of 9 out 10.   HPI: HPI  Problems:  Patient Active Problem List   Diagnosis Date Noted   Uncontrolled type 2 diabetes mellitus with hyperglycemia (HCC) 08/08/2024   Hyperlipidemia 08/04/2024   OAB (overactive bladder) 08/20/2021   S/P right hemicolectomy 10/11/2019   Allergic rhinitis 04/22/2016   HTN (hypertension) 09/24/2014   GERD (gastroesophageal reflux disease) 08/12/2012   Elevated PSA 08/12/2012   Preventative health care 08/04/2011   H/O splenectomy 02/07/2008    Allergies: No Known Allergies Medications:  Current Outpatient Medications:    colchicine 0.6 MG tablet, Take 2 tabs immediately, then 1 tab twice per day for the duration of the flare up to a max of 7 days, Disp: 21 tablet, Rfl: 0   predniSONE (DELTASONE) 20 MG tablet, Take 2 tablets (40 mg total) by mouth daily with breakfast for 5 days., Disp: 10 tablet, Rfl: 0   amLODipine  (NORVASC ) 10 MG tablet, Take 1 tablet (10 mg total) by mouth daily., Disp: 90 tablet, Rfl: 1  atorvastatin  (LIPITOR) 20 MG tablet, Take 1 tablet (20 mg total) by mouth daily., Disp: 90 tablet, Rfl: 0   loratadine  (CLARITIN ) 10 MG tablet, Take 10 mg by mouth daily., Disp: , Rfl:    metoprolol  succinate (TOPROL -XL) 100 MG 24 hr tablet, Take 1 tablet (100 mg total) by mouth daily. Take with or immediately following a meal, Disp: 90 tablet, Rfl: 1   omeprazole  (PRILOSEC) 20 MG capsule, Take 1 capsule (20 mg total) by mouth daily., Disp: 90 capsule,  Rfl: 1   tadalafil (CIALIS) 5 MG tablet, Take 5 mg by mouth daily., Disp: , Rfl:    valsartan  (DIOVAN ) 160 MG tablet, TAKE 1 TABLET BY MOUTH EVERY DAY, Disp: 90 tablet, Rfl: 1   Vibegron (GEMTESA) 75 MG TABS, Take by mouth., Disp: , Rfl:   Observations/Objective: Patient is well-developed, well-nourished in no acute distress.  Resting comfortably  at home.  Head is normocephalic, atraumatic.  No labored breathing.  Speech is clear and coherent with logical content.  Patient is alert and oriented at baseline.  Right great toe swelling and erythemas   Assessment and Plan: 1. Acute gout involving toe of right foot, unspecified cause (Primary) - colchicine 0.6 MG tablet; Take 2 tabs immediately, then 1 tab twice per day for the duration of the flare up to a max of 7 days  Dispense: 21 tablet; Refill: 0 - predniSONE (DELTASONE) 20 MG tablet; Take 2 tablets (40 mg total) by mouth daily with breakfast for 5 days.  Dispense: 10 tablet; Refill: 0  Force fluids Low purine diet Start colchicine and prednisone tomorrow Follow up if symptoms worsen or do not improve   Follow Up Instructions: I discussed the assessment and treatment plan with the patient. The patient was provided an opportunity to ask questions and all were answered. The patient agreed with the plan and demonstrated an understanding of the instructions.  A copy of instructions were sent to the patient via MyChart unless otherwise noted below.    The patient was advised to call back or seek an in-person evaluation if the symptoms worsen or if the condition fails to improve as anticipated.    Bari Learn, FNP

## 2024-12-18 ENCOUNTER — Other Ambulatory Visit: Payer: Self-pay | Admitting: Family

## 2024-12-18 DIAGNOSIS — M109 Gout, unspecified: Secondary | ICD-10-CM

## 2024-12-19 ENCOUNTER — Ambulatory Visit

## 2024-12-19 DIAGNOSIS — Z23 Encounter for immunization: Secondary | ICD-10-CM

## 2024-12-19 NOTE — Progress Notes (Signed)
 Pt was in office today for third dose of Hep-B vaccine per PCP, vaccine was given in L deltoid and pt tolerated well.

## 2025-01-02 ENCOUNTER — Encounter: Payer: Self-pay | Admitting: Gastroenterology

## 2025-01-22 ENCOUNTER — Ambulatory Visit: Admitting: Sleep Medicine

## 2025-01-29 ENCOUNTER — Ambulatory Visit: Admitting: Gastroenterology

## 2025-02-01 ENCOUNTER — Encounter: Payer: Self-pay | Admitting: Family

## 2025-02-01 DIAGNOSIS — M109 Gout, unspecified: Secondary | ICD-10-CM

## 2025-02-01 MED ORDER — COLCHICINE 0.6 MG PO TABS: ORAL_TABLET | ORAL | 0 refills | Status: AC

## 2025-02-05 ENCOUNTER — Ambulatory Visit: Admitting: Sleep Medicine

## 2025-02-27 ENCOUNTER — Encounter: Admitting: Family
